# Patient Record
Sex: Female | Born: 1985 | Race: Black or African American | Hispanic: No | Marital: Single | Smoking: Current some day smoker
Health system: Southern US, Community
[De-identification: ages and names within clinical notes are randomized; demographics above are authoritative.]

## PROBLEM LIST (undated history)

## (undated) ENCOUNTER — Inpatient Hospital Stay (HOSPITAL_COMMUNITY): Payer: Self-pay

## (undated) ENCOUNTER — Ambulatory Visit: Source: Home / Self Care

## (undated) DIAGNOSIS — K589 Irritable bowel syndrome without diarrhea: Secondary | ICD-10-CM

## (undated) HISTORY — PX: INDUCED ABORTION: SHX677

## (undated) HISTORY — PX: NO PAST SURGERIES: SHX2092

---

## 2003-02-08 ENCOUNTER — Emergency Department (HOSPITAL_COMMUNITY): Admission: EM | Admit: 2003-02-08 | Discharge: 2003-02-08 | Payer: Self-pay | Admitting: Emergency Medicine

## 2003-04-10 ENCOUNTER — Inpatient Hospital Stay (HOSPITAL_COMMUNITY): Admission: AD | Admit: 2003-04-10 | Discharge: 2003-04-10 | Payer: Self-pay | Admitting: Obstetrics and Gynecology

## 2003-04-12 ENCOUNTER — Inpatient Hospital Stay (HOSPITAL_COMMUNITY): Admission: AD | Admit: 2003-04-12 | Discharge: 2003-04-14 | Payer: Self-pay | Admitting: Obstetrics and Gynecology

## 2003-05-12 ENCOUNTER — Other Ambulatory Visit: Admission: RE | Admit: 2003-05-12 | Discharge: 2003-05-12 | Payer: Self-pay | Admitting: Obstetrics & Gynecology

## 2003-12-27 ENCOUNTER — Emergency Department (HOSPITAL_COMMUNITY): Admission: EM | Admit: 2003-12-27 | Discharge: 2003-12-27 | Payer: Self-pay | Admitting: Family Medicine

## 2004-07-12 ENCOUNTER — Emergency Department (HOSPITAL_COMMUNITY): Admission: EM | Admit: 2004-07-12 | Discharge: 2004-07-12 | Payer: Self-pay | Admitting: Family Medicine

## 2004-10-31 ENCOUNTER — Emergency Department (HOSPITAL_COMMUNITY): Admission: EM | Admit: 2004-10-31 | Discharge: 2004-10-31 | Payer: Self-pay | Admitting: Family Medicine

## 2007-01-07 ENCOUNTER — Emergency Department (HOSPITAL_COMMUNITY): Admission: EM | Admit: 2007-01-07 | Discharge: 2007-01-07 | Payer: Self-pay | Admitting: Emergency Medicine

## 2007-03-10 ENCOUNTER — Other Ambulatory Visit: Admission: RE | Admit: 2007-03-10 | Discharge: 2007-03-10 | Payer: Self-pay | Admitting: Obstetrics and Gynecology

## 2007-12-08 ENCOUNTER — Emergency Department (HOSPITAL_COMMUNITY): Admission: EM | Admit: 2007-12-08 | Discharge: 2007-12-08 | Payer: Self-pay | Admitting: Family Medicine

## 2008-02-23 ENCOUNTER — Emergency Department (HOSPITAL_COMMUNITY): Admission: EM | Admit: 2008-02-23 | Discharge: 2008-02-23 | Payer: Self-pay | Admitting: Emergency Medicine

## 2008-03-22 ENCOUNTER — Inpatient Hospital Stay (HOSPITAL_COMMUNITY): Admission: AD | Admit: 2008-03-22 | Discharge: 2008-03-22 | Payer: Self-pay | Admitting: Obstetrics & Gynecology

## 2008-08-09 ENCOUNTER — Emergency Department (HOSPITAL_COMMUNITY): Admission: EM | Admit: 2008-08-09 | Discharge: 2008-08-09 | Payer: Self-pay | Admitting: Family Medicine

## 2008-09-18 ENCOUNTER — Emergency Department (HOSPITAL_COMMUNITY): Admission: EM | Admit: 2008-09-18 | Discharge: 2008-09-18 | Payer: Self-pay | Admitting: Family Medicine

## 2009-02-21 ENCOUNTER — Emergency Department (HOSPITAL_COMMUNITY): Admission: EM | Admit: 2009-02-21 | Discharge: 2009-02-21 | Payer: Self-pay | Admitting: Family Medicine

## 2009-07-08 ENCOUNTER — Emergency Department (HOSPITAL_COMMUNITY): Admission: EM | Admit: 2009-07-08 | Discharge: 2009-07-08 | Payer: Self-pay | Admitting: Emergency Medicine

## 2009-09-27 ENCOUNTER — Emergency Department (HOSPITAL_COMMUNITY): Admission: EM | Admit: 2009-09-27 | Discharge: 2009-09-27 | Payer: Self-pay | Admitting: Family Medicine

## 2009-11-27 ENCOUNTER — Emergency Department (HOSPITAL_COMMUNITY): Admission: EM | Admit: 2009-11-27 | Discharge: 2009-11-27 | Payer: Self-pay | Admitting: Family Medicine

## 2010-02-16 ENCOUNTER — Inpatient Hospital Stay (HOSPITAL_COMMUNITY): Admission: AD | Admit: 2010-02-16 | Discharge: 2010-02-16 | Payer: Self-pay | Admitting: Obstetrics

## 2010-02-20 ENCOUNTER — Ambulatory Visit (HOSPITAL_COMMUNITY): Admission: RE | Admit: 2010-02-20 | Discharge: 2010-02-20 | Payer: Self-pay | Admitting: Obstetrics & Gynecology

## 2010-07-02 ENCOUNTER — Inpatient Hospital Stay (HOSPITAL_COMMUNITY)
Admission: AD | Admit: 2010-07-02 | Discharge: 2010-07-02 | Payer: Self-pay | Source: Home / Self Care | Attending: Obstetrics & Gynecology | Admitting: Obstetrics & Gynecology

## 2010-07-10 ENCOUNTER — Ambulatory Visit (HOSPITAL_COMMUNITY)
Admission: RE | Admit: 2010-07-10 | Discharge: 2010-07-10 | Payer: Self-pay | Source: Home / Self Care | Attending: Obstetrics & Gynecology | Admitting: Obstetrics & Gynecology

## 2010-07-11 ENCOUNTER — Inpatient Hospital Stay (HOSPITAL_COMMUNITY)
Admission: AD | Admit: 2010-07-11 | Discharge: 2010-07-11 | Payer: Self-pay | Source: Home / Self Care | Attending: Obstetrics & Gynecology | Admitting: Obstetrics & Gynecology

## 2010-07-17 ENCOUNTER — Inpatient Hospital Stay (HOSPITAL_COMMUNITY)
Admission: RE | Admit: 2010-07-17 | Discharge: 2010-07-19 | Payer: Self-pay | Source: Home / Self Care | Attending: Obstetrics | Admitting: Obstetrics

## 2010-07-17 LAB — CBC
HCT: 36.8 % (ref 36.0–46.0)
Hemoglobin: 12.2 g/dL (ref 12.0–15.0)
MCH: 30.4 pg (ref 26.0–34.0)
MCHC: 33.2 g/dL (ref 30.0–36.0)
MCV: 91.8 fL (ref 78.0–100.0)
Platelets: 254 10*3/uL (ref 150–400)
RBC: 4.01 MIL/uL (ref 3.87–5.11)
RDW: 13.3 % (ref 11.5–15.5)
WBC: 9.5 10*3/uL (ref 4.0–10.5)

## 2010-07-17 LAB — RPR: RPR Ser Ql: NONREACTIVE

## 2010-07-18 LAB — CBC
HCT: 32.3 % — ABNORMAL LOW (ref 36.0–46.0)
Hemoglobin: 10.8 g/dL — ABNORMAL LOW (ref 12.0–15.0)
MCH: 30.7 pg (ref 26.0–34.0)
MCHC: 33.4 g/dL (ref 30.0–36.0)
MCV: 91.8 fL (ref 78.0–100.0)
Platelets: 207 10*3/uL (ref 150–400)
RBC: 3.52 MIL/uL — ABNORMAL LOW (ref 3.87–5.11)
RDW: 13.3 % (ref 11.5–15.5)
WBC: 15.2 10*3/uL — ABNORMAL HIGH (ref 4.0–10.5)

## 2010-09-23 LAB — COMPREHENSIVE METABOLIC PANEL
ALT: 11 U/L (ref 0–35)
AST: 19 U/L (ref 0–37)
Albumin: 2.7 g/dL — ABNORMAL LOW (ref 3.5–5.2)
Alkaline Phosphatase: 83 U/L (ref 39–117)
BUN: 6 mg/dL (ref 6–23)
CO2: 23 mEq/L (ref 19–32)
Calcium: 8.7 mg/dL (ref 8.4–10.5)
Chloride: 105 mEq/L (ref 96–112)
Creatinine, Ser: 0.47 mg/dL (ref 0.4–1.2)
GFR calc Af Amer: >60 mL/min (ref >60)
GFR calc non Af Amer: >60 mL/min (ref >60)
Glucose, Bld: 79 mg/dL (ref 70–99)
Potassium: 3.6 mEq/L (ref 3.5–5.1)
Sodium: 134 mEq/L — ABNORMAL LOW (ref 135–145)
Total Bilirubin: 0.4 mg/dL (ref 0.3–1.2)
Total Protein: 6.5 g/dL (ref 6.0–8.3)

## 2010-09-23 LAB — URINALYSIS, ROUTINE W REFLEX MICROSCOPIC
Bilirubin Urine: NEGATIVE
Bilirubin Urine: NEGATIVE
Glucose, UA: NEGATIVE mg/dL
Glucose, UA: NEGATIVE mg/dL
Hgb urine dipstick: NEGATIVE
Ketones, ur: NEGATIVE mg/dL
Ketones, ur: NEGATIVE mg/dL
Leukocytes, UA: NEGATIVE
Nitrite: NEGATIVE
Nitrite: NEGATIVE
Protein, ur: NEGATIVE mg/dL
Protein, ur: NEGATIVE mg/dL
Specific Gravity, Urine: 1.015 (ref 1.005–1.030)
Specific Gravity, Urine: 1.015 (ref 1.005–1.030)
Urobilinogen, UA: 0.2 mg/dL (ref 0.0–1.0)
Urobilinogen, UA: 0.2 mg/dL (ref 0.0–1.0)
pH: 7 (ref 5.0–8.0)
pH: 7.5 (ref 5.0–8.0)

## 2010-09-23 LAB — CBC
HCT: 34.7 % — ABNORMAL LOW (ref 36.0–46.0)
Hemoglobin: 11.6 g/dL — ABNORMAL LOW (ref 12.0–15.0)
MCH: 30.9 pg (ref 26.0–34.0)
MCHC: 33.4 g/dL (ref 30.0–36.0)
MCV: 92.3 fL (ref 78.0–100.0)
Platelets: 244 10*3/uL (ref 150–400)
RBC: 3.76 MIL/uL — ABNORMAL LOW (ref 3.87–5.11)
RDW: 13.2 % (ref 11.5–15.5)
WBC: 9.4 10*3/uL (ref 4.0–10.5)

## 2010-09-23 LAB — URINE MICROSCOPIC-ADD ON

## 2010-09-27 LAB — DIFFERENTIAL
Basophils Absolute: 0 10*3/uL (ref 0.0–0.1)
Basophils Relative: 0 % (ref 0–1)
Eosinophils Absolute: 0 10*3/uL (ref 0.0–0.7)
Eosinophils Relative: 0 % (ref 0–5)
Lymphocytes Relative: 19 % (ref 12–46)
Lymphs Abs: 2.5 10*3/uL (ref 0.7–4.0)
Monocytes Absolute: 1 10*3/uL (ref 0.1–1.0)
Monocytes Relative: 8 % (ref 3–12)
Neutro Abs: 9.5 10*3/uL — ABNORMAL HIGH (ref 1.7–7.7)
Neutrophils Relative %: 73 % (ref 43–77)

## 2010-09-27 LAB — CBC
HCT: 35.5 % — ABNORMAL LOW (ref 36.0–46.0)
Hemoglobin: 12.2 g/dL (ref 12.0–15.0)
MCH: 32.7 pg (ref 26.0–34.0)
MCHC: 34.3 g/dL (ref 30.0–36.0)
MCV: 95.2 fL (ref 78.0–100.0)
Platelets: 252 10*3/uL (ref 150–400)
RBC: 3.73 MIL/uL — ABNORMAL LOW (ref 3.87–5.11)
RDW: 13.1 % (ref 11.5–15.5)
WBC: 13.1 10*3/uL — ABNORMAL HIGH (ref 4.0–10.5)

## 2010-09-27 LAB — URINE MICROSCOPIC-ADD ON

## 2010-09-27 LAB — URINALYSIS, ROUTINE W REFLEX MICROSCOPIC
Bilirubin Urine: NEGATIVE
Glucose, UA: NEGATIVE mg/dL
Hgb urine dipstick: NEGATIVE
Ketones, ur: NEGATIVE mg/dL
Nitrite: NEGATIVE
Protein, ur: NEGATIVE mg/dL
Specific Gravity, Urine: >1.03 — ABNORMAL HIGH (ref 1.005–1.030)
Urobilinogen, UA: 0.2 mg/dL (ref 0.0–1.0)
pH: 5.5 (ref 5.0–8.0)

## 2010-09-27 LAB — URINE CULTURE
Colony Count: 25000
Culture  Setup Time: 201108062016

## 2010-09-30 LAB — WET PREP, GENITAL
Trich, Wet Prep: NONE SEEN
Yeast Wet Prep HPF POC: NONE SEEN

## 2010-09-30 LAB — POCT URINALYSIS DIP (DEVICE)
Bilirubin Urine: NEGATIVE
Glucose, UA: NEGATIVE mg/dL
Hgb urine dipstick: NEGATIVE
Ketones, ur: NEGATIVE mg/dL
Nitrite: NEGATIVE
Protein, ur: NEGATIVE mg/dL
Specific Gravity, Urine: 1.025 (ref 1.005–1.030)
Urobilinogen, UA: 1 mg/dL (ref 0.0–1.0)
pH: 7.5 (ref 5.0–8.0)

## 2010-09-30 LAB — GC/CHLAMYDIA PROBE AMP, GENITAL
Chlamydia, DNA Probe: NEGATIVE
GC Probe Amp, Genital: NEGATIVE

## 2010-09-30 LAB — POCT PREGNANCY, URINE: Preg Test, Ur: POSITIVE

## 2010-10-06 LAB — POCT URINALYSIS DIP (DEVICE)
Bilirubin Urine: NEGATIVE
Glucose, UA: NEGATIVE mg/dL
Ketones, ur: NEGATIVE mg/dL
Nitrite: NEGATIVE
Protein, ur: NEGATIVE mg/dL
Specific Gravity, Urine: 1.02 (ref 1.005–1.030)
Urobilinogen, UA: 1 mg/dL (ref 0.0–1.0)
pH: 6.5 (ref 5.0–8.0)

## 2010-10-06 LAB — WET PREP, GENITAL
Clue Cells Wet Prep HPF POC: NONE SEEN
Trich, Wet Prep: NONE SEEN
Yeast Wet Prep HPF POC: NONE SEEN

## 2010-10-06 LAB — POCT PREGNANCY, URINE: Preg Test, Ur: NEGATIVE

## 2010-10-06 LAB — GC/CHLAMYDIA PROBE AMP, GENITAL
Chlamydia, DNA Probe: NEGATIVE
GC Probe Amp, Genital: NEGATIVE

## 2010-10-06 LAB — HERPES SIMPLEX VIRUS CULTURE: Culture: NOT DETECTED

## 2010-10-24 LAB — POCT URINALYSIS DIP (DEVICE)
Bilirubin Urine: NEGATIVE
Glucose, UA: NEGATIVE mg/dL
Hgb urine dipstick: NEGATIVE
Ketones, ur: NEGATIVE mg/dL
Specific Gravity, Urine: 1.02 (ref 1.005–1.030)

## 2010-10-24 LAB — POCT PREGNANCY, URINE: Preg Test, Ur: NEGATIVE

## 2010-10-24 LAB — GC/CHLAMYDIA PROBE AMP, GENITAL
Chlamydia, DNA Probe: NEGATIVE
GC Probe Amp, Genital: NEGATIVE

## 2010-10-24 LAB — WET PREP, GENITAL: Trich, Wet Prep: NONE SEEN

## 2010-10-24 LAB — RPR: RPR Ser Ql: NONREACTIVE

## 2011-04-09 LAB — POCT URINALYSIS DIP (DEVICE)
Protein, ur: NEGATIVE
Specific Gravity, Urine: 1.025
pH: 5

## 2011-04-09 LAB — POCT PREGNANCY, URINE: Operator id: 239701

## 2011-04-09 LAB — WET PREP, GENITAL
Trich, Wet Prep: NONE SEEN
Yeast Wet Prep HPF POC: NONE SEEN

## 2011-04-09 LAB — GC/CHLAMYDIA PROBE AMP, GENITAL: GC Probe Amp, Genital: NEGATIVE

## 2011-04-11 LAB — POCT URINALYSIS DIP (DEVICE)
Ketones, ur: NEGATIVE
Operator id: 239701
Protein, ur: NEGATIVE
Specific Gravity, Urine: 1.015
Urobilinogen, UA: 1
pH: 7

## 2011-04-11 LAB — WET PREP, GENITAL: Trich, Wet Prep: NONE SEEN

## 2011-04-11 LAB — POCT PREGNANCY, URINE: Preg Test, Ur: NEGATIVE

## 2011-04-11 LAB — GC/CHLAMYDIA PROBE AMP, GENITAL: GC Probe Amp, Genital: NEGATIVE

## 2011-04-16 LAB — URINALYSIS, ROUTINE W REFLEX MICROSCOPIC
Leukocytes, UA: NEGATIVE
Nitrite: NEGATIVE
Protein, ur: NEGATIVE
Specific Gravity, Urine: >1.03 — ABNORMAL HIGH
Urobilinogen, UA: 0.2

## 2011-04-16 LAB — WET PREP, GENITAL
Clue Cells Wet Prep HPF POC: NONE SEEN
Trich, Wet Prep: NONE SEEN
Yeast Wet Prep HPF POC: NONE SEEN

## 2011-04-16 LAB — GC/CHLAMYDIA PROBE AMP, GENITAL: Chlamydia, DNA Probe: NEGATIVE

## 2011-04-16 LAB — URINE MICROSCOPIC-ADD ON

## 2011-11-03 ENCOUNTER — Emergency Department (HOSPITAL_BASED_OUTPATIENT_CLINIC_OR_DEPARTMENT_OTHER)
Admission: EM | Admit: 2011-11-03 | Discharge: 2011-11-03 | Disposition: A | Discharge status: Home / Self Care | Payer: Self-pay | Attending: Emergency Medicine | Admitting: Emergency Medicine

## 2011-11-03 ENCOUNTER — Encounter (HOSPITAL_BASED_OUTPATIENT_CLINIC_OR_DEPARTMENT_OTHER): Payer: Self-pay

## 2011-11-03 DIAGNOSIS — F17200 Nicotine dependence, unspecified, uncomplicated: Secondary | ICD-10-CM | POA: Insufficient documentation

## 2011-11-03 DIAGNOSIS — J310 Chronic rhinitis: Secondary | ICD-10-CM

## 2011-11-03 DIAGNOSIS — J309 Allergic rhinitis, unspecified: Secondary | ICD-10-CM | POA: Insufficient documentation

## 2011-11-03 DIAGNOSIS — N898 Other specified noninflammatory disorders of vagina: Secondary | ICD-10-CM | POA: Insufficient documentation

## 2011-11-03 LAB — URINE MICROSCOPIC-ADD ON

## 2011-11-03 LAB — URINALYSIS, ROUTINE W REFLEX MICROSCOPIC
Bilirubin Urine: NEGATIVE
Ketones, ur: NEGATIVE mg/dL
Nitrite: NEGATIVE
Protein, ur: NEGATIVE mg/dL
pH: 6 (ref 5.0–8.0)

## 2011-11-03 MED ORDER — PREDNISONE 50 MG PO TABS
60.0000 mg | ORAL_TABLET | Freq: Once | ORAL | Status: AC
  Administered 2011-11-03: 60 mg via ORAL
  Filled 2011-11-03: qty 1

## 2011-11-03 MED ORDER — PREDNISONE 50 MG PO TABS: 50.0000 mg | ORAL_TABLET | Freq: Every day | ORAL | Status: AC

## 2011-11-03 MED ORDER — FEXOFENADINE-PSEUDOEPHED ER 60-120 MG PO TB12: 1.0000 | ORAL_TABLET | Freq: Two times a day (BID) | ORAL | Status: AC

## 2011-11-03 NOTE — ED Notes (Signed)
Pt states that she has vaginal discharge which she would like to be looked at, also c/o sinus infection.  Pt states that she is taking otc meds for sinus infection and it is not improving.

## 2011-11-03 NOTE — Discharge Instructions (Signed)
Return here as needed. Follow up for care for your vaginal issue.

## 2011-11-06 NOTE — ED Provider Notes (Signed)
History     CSN: [id number]  Arrival date & time 11/03/11  1302   First MD Initiated Contact with Patient 11/03/11 1314      Chief Complaint  Patient presents with  . Recurrent Sinusitis  . Vaginal Discharge    (Consider location/radiation/quality/duration/timing/severity/associated sxs/prior treatment) HPI The patient presents to the ER with a complaint of watery eyes and nasal discharge for th last week. She also noted that she has a vaginal discharge which she has had in the past with bacterial vaginosis. The patient denies CP, SOB, weakness, vaginal bleeding, dizziness, visual changes, cough, ST, N/V, diarrhea, abd pain, or fever. The patient states that she noted when she was outside her symptoms worsen. The patient states that this has not gotten better with OTC medications. She states that she has not had an STD in the past. History reviewed. No pertinent past medical history.  History reviewed. No pertinent past surgical history.  History reviewed. No pertinent family history.  History  Substance Use Topics  . Smoking status: Current Some Day Smoker    Types: Cigarettes  . Smokeless tobacco: Never Used  . Alcohol Use: Yes     socially    OB History    Grav Para Term Preterm Abortions TAB SAB Ect Mult Living                  Review of Systems All other systems negative except as documented in the HPI. All pertinent positives and negatives as reviewed in the HPI.   Allergies  Review of patient's allergies indicates no known allergies.  Home Medications   Current Outpatient Rx  Name Route Sig Dispense Refill  . FEXOFENADINE-PSEUDOEPHED ER 60-120 MG PO TB12 Oral Take 1 tablet by mouth every 12 (twelve) hours. 30 tablet 0  . PREDNISONE 50 MG PO TABS Oral Take 1 tablet (50 mg total) by mouth daily. 5 tablet 0    BP 129/74  Pulse 94  Temp(Src) 97.9 F (36.6 C) (Oral)  Resp 17  Ht 5\' 7"  (1.702 m)  Wt 163 lb (73.936 kg)  BMI 25.53 kg/m2  SpO2  99%  Physical Exam Physical Examination: General appearance - alert, well appearing, and in no distress, oriented to person, place, and time and normal appearing weight Mental status - alert, oriented to person, place, and time, normal mood, behavior, speech, dress, motor activity, and thought processes Eyes - pupils equal and reactive, extraocular eye movements intact. The patient has watering and mild injection of both eyes. Ears - bilateral TM's and external ear canals normal Nose - mucosal congestion and clear rhinorrhea Mouth - mucous membranes moist, pharynx normal without lesions Neck - supple, no significant adenopathy Lymphatics - no palpable lymphadenopathy Chest - clear to auscultation, no wheezes, rales or rhonchi, symmetric air entry, no tachypnea, retractions or cyanosis Heart - normal rate, regular rhythm, normal S1, S2, no murmurs, rubs, clicks or gallops Abdomen - soft, nontender, nondistended, no masses or organomegaly  ED Course  Procedures (including critical care time)  Labs Reviewed  URINALYSIS, ROUTINE W REFLEX MICROSCOPIC - Abnormal; Notable for the following:    APPearance CLOUDY (*)    Specific Gravity, Urine 1.031 (*)    Hgb urine dipstick SMALL (*)    Leukocytes, UA SMALL (*)    All other components within normal limits  URINE MICROSCOPIC-ADD ON - Abnormal; Notable for the following:    Squamous Epithelial / LPF FEW (*)    Bacteria, UA MANY (*)  All other components within normal limits  PREGNANCY, URINE   No results found.   1. Rhinitis    The patient has decided to leave and does not want to have a pelvic exam at this time.    MDM  The patient describes an allergic rhinitis picture based on her HPI and PE. The patient is advised to be seen about her pelvic issues when she has more time to be fully evaluated.        Carlyle Dolly, PA-C 11/06/11 1149

## 2011-11-07 NOTE — ED Provider Notes (Signed)
Medical screening examination/treatment/procedure(s) were performed by non-physician practitioner and as supervising physician I was immediately available for consultation/collaboration.   Joya Gaskins, MD 11/07/11 2235

## 2012-02-19 ENCOUNTER — Emergency Department (INDEPENDENT_AMBULATORY_CARE_PROVIDER_SITE_OTHER)
Admission: EM | Admit: 2012-02-19 | Discharge: 2012-02-19 | Disposition: A | Discharge status: Home / Self Care | Payer: Self-pay | Source: Home / Self Care | Attending: Family Medicine | Admitting: Family Medicine

## 2012-02-19 ENCOUNTER — Encounter (HOSPITAL_COMMUNITY): Payer: Self-pay | Admitting: *Deleted

## 2012-02-19 DIAGNOSIS — N760 Acute vaginitis: Secondary | ICD-10-CM

## 2012-02-19 DIAGNOSIS — A499 Bacterial infection, unspecified: Secondary | ICD-10-CM

## 2012-02-19 DIAGNOSIS — B9689 Other specified bacterial agents as the cause of diseases classified elsewhere: Secondary | ICD-10-CM

## 2012-02-19 LAB — WET PREP, GENITAL: Trich, Wet Prep: NONE SEEN

## 2012-02-19 LAB — POCT URINALYSIS DIP (DEVICE)
Protein, ur: NEGATIVE mg/dL
Specific Gravity, Urine: 1.02 (ref 1.005–1.030)
Urobilinogen, UA: 1 mg/dL (ref 0.0–1.0)

## 2012-02-19 MED ORDER — METRONIDAZOLE 250 MG PO TABS: 250.0000 mg | ORAL_TABLET | Freq: Three times a day (TID) | ORAL | Status: AC

## 2012-02-19 MED ORDER — FLUCONAZOLE 150 MG PO TABS: 150.0000 mg | ORAL_TABLET | Freq: Once | ORAL | Status: AC

## 2012-02-19 NOTE — ED Notes (Signed)
Pt  Reports   Symptoms      Of    Vaginal  Discharge        X  3  Days        -        Walks  Upright  With a  Steady  Fluid  Gait      -       Pt       denys  Any  Pain

## 2012-02-19 NOTE — ED Provider Notes (Signed)
History     CSN: [id number]  Arrival date & time 02/19/12  1512   First MD Initiated Contact with Patient 02/19/12 1615      Chief Complaint  Patient presents with  . Vaginal Discharge    (Consider location/radiation/quality/duration/timing/severity/associated sxs/prior treatment) Patient is a 26 y.o. female presenting with vaginal discharge. The history is provided by the patient.  Vaginal Discharge This is a new problem. The current episode started more than 2 days ago. The problem has been gradually worsening. Pertinent negatives include no abdominal pain. Associated symptoms comments: H/o BV, last treated 1 yr ago.Marland Kitchen    History reviewed. No pertinent past medical history.  History reviewed. No pertinent past surgical history.  No family history on file.  History  Substance Use Topics  . Smoking status: Current Some Day Smoker    Types: Cigarettes  . Smokeless tobacco: Never Used  . Alcohol Use: Yes     socially    OB History    Grav Para Term Preterm Abortions TAB SAB Ect Mult Living                  Review of Systems  Constitutional: Negative.   Gastrointestinal: Negative.  Negative for abdominal pain.  Genitourinary: Positive for vaginal discharge. Negative for dysuria, vaginal bleeding, difficulty urinating and vaginal pain.    Allergies  Review of patient's allergies indicates no known allergies.  Home Medications   Current Outpatient Rx  Name Route Sig Dispense Refill  . FEXOFENADINE-PSEUDOEPHED ER 60-120 MG PO TB12 Oral Take 1 tablet by mouth every 12 (twelve) hours. 30 tablet 0  . METRONIDAZOLE 250 MG PO TABS Oral Take 1 tablet (250 mg total) by mouth 3 (three) times daily. 21 tablet 0    BP 118/65  Pulse 90  Temp 98.3 F (36.8 C) (Oral)  Resp 16  SpO2 100%  Physical Exam  Nursing note and vitals reviewed. Constitutional: She is oriented to person, place, and time. She appears well-developed and well-nourished.  Abdominal: Soft. Bowel  sounds are normal. She exhibits no distension and no mass. There is no tenderness. There is no rebound and no guarding.  Genitourinary: Uterus normal. Vaginal discharge found.  Neurological: She is alert and oriented to person, place, and time.  Skin: Skin is warm and dry.    ED Course  Procedures (including critical care time)  Labs Reviewed  POCT URINALYSIS DIP (DEVICE) - Abnormal; Notable for the following:    Ketones, ur TRACE (*)     Hgb urine dipstick TRACE (*)     All other components within normal limits  WET PREP, GENITAL - Abnormal; Notable for the following:    Clue Cells Wet Prep HPF POC FEW (*)     WBC, Wet Prep HPF POC MODERATE (*)     All other components within normal limits  GC/CHLAMYDIA PROBE AMP, GENITAL  POCT PREGNANCY, URINE  LAB REPORT - SCANNED   No results found.   1. Bacterial vaginosis       MDM          Linna Hoff, MD 02/23/12 2012

## 2012-02-20 NOTE — ED Notes (Signed)
GC/Chlamydia neg., Wet prep: few clue cells, few WBC's.  Pt. adequately treated with Flagyl. [name] 02/20/2012

## 2012-02-23 ENCOUNTER — Telehealth (HOSPITAL_COMMUNITY): Payer: Self-pay | Admitting: *Deleted

## 2012-02-23 NOTE — ED Notes (Signed)
Pt. called on VM x 2 for her lab results. Pt. called again.  Pt. verified x 2 and given results.  Pt. told she was adequately treated with Flagyl. [name] 02/23/2012

## 2012-04-07 ENCOUNTER — Ambulatory Visit: Payer: Self-pay

## 2012-04-20 ENCOUNTER — Other Ambulatory Visit: Payer: Self-pay | Admitting: Internal Medicine

## 2012-09-02 ENCOUNTER — Emergency Department (INDEPENDENT_AMBULATORY_CARE_PROVIDER_SITE_OTHER)
Admission: EM | Admit: 2012-09-02 | Discharge: 2012-09-02 | Payer: Self-pay | Source: Home / Self Care | Attending: Family Medicine | Admitting: Family Medicine

## 2012-09-03 ENCOUNTER — Encounter (HOSPITAL_COMMUNITY): Payer: Self-pay

## 2012-09-03 ENCOUNTER — Other Ambulatory Visit (HOSPITAL_COMMUNITY)
Admission: RE | Admit: 2012-09-03 | Discharge: 2012-09-03 | Disposition: A | Discharge status: Home / Self Care | Payer: Self-pay | Source: Ambulatory Visit | Attending: Emergency Medicine | Admitting: Emergency Medicine

## 2012-09-03 ENCOUNTER — Emergency Department (INDEPENDENT_AMBULATORY_CARE_PROVIDER_SITE_OTHER)
Admission: EM | Admit: 2012-09-03 | Discharge: 2012-09-03 | Disposition: A | Discharge status: Home / Self Care | Payer: Self-pay | Source: Home / Self Care | Attending: Emergency Medicine | Admitting: Emergency Medicine

## 2012-09-03 DIAGNOSIS — Z113 Encounter for screening for infections with a predominantly sexual mode of transmission: Secondary | ICD-10-CM | POA: Insufficient documentation

## 2012-09-03 DIAGNOSIS — L0291 Cutaneous abscess, unspecified: Secondary | ICD-10-CM

## 2012-09-03 DIAGNOSIS — L0390 Cellulitis, unspecified: Secondary | ICD-10-CM

## 2012-09-03 DIAGNOSIS — N760 Acute vaginitis: Secondary | ICD-10-CM | POA: Insufficient documentation

## 2012-09-03 LAB — POCT URINALYSIS DIP (DEVICE)
Bilirubin Urine: NEGATIVE
Glucose, UA: NEGATIVE mg/dL
Leukocytes, UA: NEGATIVE
Nitrite: NEGATIVE
Urobilinogen, UA: 1 mg/dL (ref 0.0–1.0)
pH: 6.5 (ref 5.0–8.0)

## 2012-09-03 MED ORDER — CEPHALEXIN 500 MG PO CAPS: 500.0000 mg | ORAL_CAPSULE | Freq: Three times a day (TID) | ORAL | Status: DC

## 2012-09-03 MED ORDER — FLUCONAZOLE 150 MG PO TABS: 150.0000 mg | ORAL_TABLET | Freq: Once | ORAL | Status: DC

## 2012-09-03 NOTE — ED Provider Notes (Signed)
Chief Complaint  Patient presents with  . Vaginitis    History of Present Illness:   Amy Davies is a 27 year old female who has had a one-week history of a sore, raw, itchy papule on her left labia majora anteriorly. This has not Mr. ulcerated. She's also had a slight vaginal discharge but denies any itching or odor. She has a history of bacterial vaginosis in the past. She denies any history of herpes or other STDs. She has not had a menstrual period in over 2 years. She had been on the Depo-Provera but got her last injection September 18. She is sexually active since then, but does not use any birth control. She denies any symptoms of pregnancy. She has no allergies, takes no meds and has no other medical illnesses.  Review of Systems:  Other than noted above, the patient denies any of the following symptoms: Systemic:  No fever, chills, sweats, fatigue, or weight loss. GI:  No abdominal pain, nausea, anorexia, vomiting, diarrhea, constipation, melena or hematochezia. GU:  No dysuria, frequency, urgency, hematuria, vaginal discharge, itching, or abnormal vaginal bleeding. Skin:  No rash or itching.  PMFSH:  Past medical history, family history, social history, meds, and allergies were reviewed.  Physical Exam:   Vital signs:  BP 127/80  Pulse 82  Temp(Src) 98.3 F (36.8 C) (Oral)  Resp 20  SpO2 98% General:  Alert, oriented and in no distress. Lungs:  Breath sounds clear and equal bilaterally.  No wheezes, rales or rhonchi. Heart:  Regular rhythm.  No gallops or murmers. Abdomen:  Soft, flat and non-distended.  No organomegaly or mass.  No tenderness, guarding or rebound.  Bowel sounds normally active. Pelvic exam:  There is an 8mm, round, raised, red papule on the left labia majora anteriorly. This is minimally tender to palpation. There is no drainage or fluctuance. External genitalia are otherwise normal. Vaginal and cervical mucosa were normal. There is a small amount of white  discharge which was non-malodorous. No pain on cervical motion. Uterus was anterior and normal in size and shape. No adnexal masses or tenderness. Skin:  Clear, warm and dry.    Labs:   Results for orders placed during the hospital encounter of 09/03/12  POCT URINALYSIS DIP (DEVICE)      Result Value Range   Glucose, UA NEGATIVE  NEGATIVE mg/dL   Bilirubin Urine NEGATIVE  NEGATIVE   Ketones, ur NEGATIVE  NEGATIVE mg/dL   Specific Gravity, Urine 1.025  1.005 - 1.030   Hgb urine dipstick TRACE (*) NEGATIVE   pH 6.5  5.0 - 8.0   Protein, ur NEGATIVE  NEGATIVE mg/dL   Urobilinogen, UA 1.0  0.0 - 1.0 mg/dL   Nitrite NEGATIVE  NEGATIVE   Leukocytes, UA NEGATIVE  NEGATIVE  POCT PREGNANCY, URINE      Result Value Range   Preg Test, Ur NEGATIVE  NEGATIVE    Other Labs Obtained at Urgent Care Center:  DNA probes for gonorrhea, Chlamydia, Trichomonas, Gardnerella, Candida obtained.  Results are pending at this time and we will call about any positive results.  Assessment:  The encounter diagnosis was Abscess.  Plan:   1.  The following meds were prescribed:   Discharge Medication List as of 09/03/2012  2:42 PM    START taking these medications   Details  cephALEXin (KEFLEX) 500 MG capsule Take 1 capsule (500 mg total) by mouth 3 (three) times daily., Starting 09/03/2012, Until Discontinued, Normal    fluconazole (DIFLUCAN) 150  MG tablet Take 1 tablet (150 mg total) by mouth once., Starting 09/03/2012, Normal       2.  The patient was instructed in symptomatic care and handouts were given. 3.  The patient was told to return if becoming worse in any way, if no better in 3 or 4 days, and given some red flag symptoms such as fever, worsening of pain, or purulent drainage that would indicate earlier return.    Reuben Likes, MD 09/03/12 [phone]

## 2012-09-03 NOTE — ED Notes (Signed)
Concern for poss yeast , used OTC yeast treatment, made issue worse; states she has a bump in her perineal area, shaves her pubic hair

## 2012-09-03 NOTE — ED Notes (Signed)
Call cell number for lab issues

## 2012-09-07 ENCOUNTER — Telehealth (HOSPITAL_COMMUNITY): Payer: Self-pay | Admitting: Emergency Medicine

## 2012-09-07 MED ORDER — METRONIDAZOLE 500 MG PO TABS: 500.0000 mg | ORAL_TABLET | Freq: Two times a day (BID) | ORAL | Status: DC

## 2012-09-07 NOTE — Telephone Encounter (Signed)
Message copied by Reuben Likes on Tue Sep 07, 2012  9:13 PM ------      Message from: [name]      Created: Tue Sep 07, 2012  6:11 PM      Regarding: labs       Gardnerella pos. No order for Flagyl noted.      [name]      09/07/2012            ----- Message -----         From: [facility] Interface         Sent: 09/06/2012   3:18 PM           To: [facility] Follow Up                   ------

## 2012-09-07 NOTE — ED Notes (Signed)
DNA probe was positive for Gardnerella. We'll need to treat with Flagyl 500 mg twice a day for one week.  Reuben Likes, MD 09/07/12 2114

## 2012-09-09 ENCOUNTER — Telehealth (HOSPITAL_COMMUNITY): Payer: Self-pay | Admitting: *Deleted

## 2012-09-09 NOTE — ED Notes (Signed)
2/25  GC/Chlamydia neg., Affirm: Gardnerella pos., Candida and Trich neg.  Flagyl e-prescibed to [facility] on [address]. 2/27  I called pt. Pt. verified x 2 and given results.  Pt. told she needs Flagyl for bacterial vaginosis.   Pt. instructed to no alcohol while taking this medication.  Pt. told it was sent to her pharmacy and voiced understanding. Amy Davies 09/09/2012

## 2013-03-18 ENCOUNTER — Encounter (HOSPITAL_COMMUNITY): Payer: Self-pay | Admitting: Emergency Medicine

## 2013-03-18 ENCOUNTER — Telehealth (HOSPITAL_COMMUNITY): Payer: Self-pay | Admitting: Family Medicine

## 2013-03-18 ENCOUNTER — Emergency Department (INDEPENDENT_AMBULATORY_CARE_PROVIDER_SITE_OTHER)
Admission: EM | Admit: 2013-03-18 | Discharge: 2013-03-18 | Disposition: A | Discharge status: Home / Self Care | Payer: Self-pay | Source: Home / Self Care | Attending: Family Medicine | Admitting: Family Medicine

## 2013-03-18 DIAGNOSIS — N760 Acute vaginitis: Secondary | ICD-10-CM

## 2013-03-18 LAB — POCT URINALYSIS DIP (DEVICE)
Bilirubin Urine: NEGATIVE
Hgb urine dipstick: NEGATIVE
Leukocytes, UA: NEGATIVE
Nitrite: NEGATIVE
Urobilinogen, UA: 0.2 mg/dL (ref 0.0–1.0)
pH: 7 (ref 5.0–8.0)

## 2013-03-18 MED ORDER — FLUCONAZOLE 150 MG PO TABS: 150.0000 mg | ORAL_TABLET | Freq: Once | ORAL | Status: DC

## 2013-03-18 MED ORDER — METRONIDAZOLE 500 MG PO TABS: 500.0000 mg | ORAL_TABLET | Freq: Two times a day (BID) | ORAL | Status: DC

## 2013-03-18 NOTE — ED Notes (Signed)
Lab called stating the affirm and GC/CH specimens were rejected.  Theresa Mulligan RN made aware.

## 2013-03-18 NOTE — ED Notes (Signed)
Vaginal discharge, frequent bacterial infections.  Vaginal discharge for 4 days

## 2013-03-18 NOTE — ED Notes (Signed)
Amy Davies said she got a written order for Flagyl  500 mg. 1 BID #14 from Dr. Artis Flock.  She asked me to call pt.   I called pt. And told her she needed it. I asked her what pharmacy did she want me to call it.  She said she already got a written Rx. for Flagyl and Diflucan.  I told her I did not have to call it. She can take her Rx.'s to the pharmacy. [name] 03/18/2013

## 2013-03-18 NOTE — ED Provider Notes (Signed)
CSN: [id number]     Arrival date & time 03/18/13  7253 History   First MD Initiated Contact with Patient 03/18/13 1018     Chief Complaint  Patient presents with  . Vaginal Discharge   (Consider location/radiation/quality/duration/timing/severity/associated sxs/prior Treatment) Patient is a 27 y.o. female presenting with vaginal discharge. The history is provided by the patient.  Vaginal Discharge Quality:  Thin and mucoid Severity:  Moderate Onset quality:  Gradual Duration:  4 days Progression:  Waxing and waning Chronicity:  Chronic Context comment:  H/o BV freq, Associated symptoms: no abdominal pain, no dysuria and no urinary frequency   Risk factors: no new sexual partner and no STI exposure     History reviewed. No pertinent past medical history. History reviewed. No pertinent past surgical history. No family history on file. History  Substance Use Topics  . Smoking status: Current Some Day Smoker    Types: Cigarettes  . Smokeless tobacco: Never Used  . Alcohol Use: Yes     Comment: socially   OB History   Grav Para Term Preterm Abortions TAB SAB Ect Mult Living                 Review of Systems  Constitutional: Negative.   Gastrointestinal: Negative.  Negative for abdominal pain.  Genitourinary: Positive for vaginal discharge. Negative for dysuria, urgency, vaginal bleeding and menstrual problem.    Allergies  Review of patient's allergies indicates no known allergies.  Home Medications   Current Outpatient Rx  Name  Route  Sig  Dispense  Refill  . cephALEXin (KEFLEX) 500 MG capsule   Oral   Take 1 capsule (500 mg total) by mouth 3 (three) times daily.   30 capsule   0   . fluconazole (DIFLUCAN) 150 MG tablet   Oral   Take 1 tablet (150 mg total) by mouth once.   3 tablet   0   . fluconazole (DIFLUCAN) 150 MG tablet   Oral   Take 1 tablet (150 mg total) by mouth once. Repeat in 1 week if needed   1 tablet   1   . metroNIDAZOLE (FLAGYL) 500 MG  tablet   Oral   Take 1 tablet (500 mg total) by mouth 2 (two) times daily.   14 tablet   0   . metroNIDAZOLE (FLAGYL) 500 MG tablet   Oral   Take 1 tablet (500 mg total) by mouth 2 (two) times daily.   14 tablet   0    BP 114/77  Pulse 87  Temp(Src) 98.8 F (37.1 C) (Oral)  Resp 20  SpO2 100% Physical Exam  Nursing note and vitals reviewed. Constitutional: She is oriented to person, place, and time. She appears well-developed and well-nourished. No distress.  Genitourinary: Uterus normal. Cervix exhibits no motion tenderness, no discharge and no friability. Right adnexum displays no mass, no tenderness and no fullness. Left adnexum displays no mass, no tenderness and no fullness. No erythema or tenderness around the vagina. No signs of injury around the vagina. Vaginal discharge found.  Neurological: She is alert and oriented to person, place, and time.  Skin: Skin is warm and dry.    ED Course  Procedures (including critical care time) Labs Review Labs Reviewed  POCT URINALYSIS DIP (DEVICE)  POCT PREGNANCY, URINE  CERVICOVAGINAL ANCILLARY ONLY   Imaging Review No results found.  MDM   1. Vaginitis    U/a and upreg neg.    Linna Hoff, MD 03/18/13 [phone]

## 2013-06-15 ENCOUNTER — Emergency Department (INDEPENDENT_AMBULATORY_CARE_PROVIDER_SITE_OTHER)
Admission: EM | Admit: 2013-06-15 | Discharge: 2013-06-15 | Disposition: A | Discharge status: Home / Self Care | Payer: Self-pay | Source: Home / Self Care | Attending: Family Medicine | Admitting: Family Medicine

## 2013-06-15 ENCOUNTER — Encounter (HOSPITAL_COMMUNITY): Payer: Self-pay | Admitting: Emergency Medicine

## 2013-06-15 DIAGNOSIS — K589 Irritable bowel syndrome without diarrhea: Secondary | ICD-10-CM

## 2013-06-15 HISTORY — DX: Irritable bowel syndrome, unspecified: K58.9

## 2013-06-15 MED ORDER — PROMETHAZINE HCL 25 MG PO TABS: 25.0000 mg | ORAL_TABLET | Freq: Four times a day (QID) | ORAL | Status: DC | PRN

## 2013-06-15 MED ORDER — POLYETHYLENE GLYCOL 3350 17 GM/SCOOP PO POWD: 1.0000 | Freq: Once | ORAL | Status: DC

## 2013-06-15 NOTE — ED Provider Notes (Signed)
CSN: [id number]     Arrival date & time 06/15/13  1348 History   First MD Initiated Contact with Patient 06/15/13 1518     Chief Complaint  Patient presents with  . Diarrhea  . Emesis   HPI 27 yo female with h/o IBS who presents with abdominal cramping and constipation alternating with diarrhea. She reports constipation with no bowel movement for 5 days up until today when she had a loose watery bowel movement, non bloody. This was associated with cramping located in the periumbilical area. She had associated nausea and non bloody, non bilious emesis. She had sensation of flushing and a bit of dizziness with the cramping and diarrhea. She reports improvement of cramping and pain after bowel movement. She has had poor appetite for the last 2 days but has been able to keep fluids down. She denies any dysuria or increased urinary frequency.  She states that she was diagnosed with IBS by a GI doctor in 2003 in [place]. She has not had a colonoscopy. She has not followed up with GI since then.   Past Medical History  Diagnosis Date  . Irritable bowel syndrome    History reviewed. No pertinent past surgical history. History reviewed. No pertinent family history. History  Substance Use Topics  . Smoking status: Current Some Day Smoker    Types: Cigarettes  . Smokeless tobacco: Never Used  . Alcohol Use: Yes     Comment: socially   OB History   Grav Para Term Preterm Abortions TAB SAB Ect Mult Living                 Review of Systems Negative except per HPI Allergies  Review of patient's allergies indicates no known allergies.  Home Medications   Current Outpatient Rx  Name  Route  Sig  Dispense  Refill  . medroxyPROGESTERone (DEPO-PROVERA) 150 MG/ML injection   Intramuscular   Inject 150 mg into the muscle every 3 (three) months.         . cephALEXin (KEFLEX) 500 MG capsule   Oral   Take 1 capsule (500 mg total) by mouth 3 (three) times daily.   30 capsule   0   .  fluconazole (DIFLUCAN) 150 MG tablet   Oral   Take 1 tablet (150 mg total) by mouth once.   3 tablet   0   . fluconazole (DIFLUCAN) 150 MG tablet   Oral   Take 1 tablet (150 mg total) by mouth once. Repeat in 1 week if needed   1 tablet   1   . metroNIDAZOLE (FLAGYL) 500 MG tablet   Oral   Take 1 tablet (500 mg total) by mouth 2 (two) times daily.   14 tablet   0   . metroNIDAZOLE (FLAGYL) 500 MG tablet   Oral   Take 1 tablet (500 mg total) by mouth 2 (two) times daily.   14 tablet   0   . polyethylene glycol powder (GLYCOLAX/MIRALAX) powder   Oral   Take 255 g (1 Container total) by mouth once.   527 g   2   . promethazine (PHENERGAN) 25 MG tablet   Oral   Take 1 tablet (25 mg total) by mouth every 6 (six) hours as needed for nausea or vomiting.   30 tablet   0    BP 102/69  Pulse 60  Temp(Src) 98.9 F (37.2 C) (Oral)  Resp 20  SpO2 100% Physical Exam General: no acute distress, alert  and oriented x4 CV: S1S2, RRR, no murmur appreciated Pulm: CTA b/l Abdomen: soft, mild discomfort in periumbilical area, no masses, no rebound  ED Course  Procedures (including critical care time) Pregnancy test: negative  MDM   1. Irritable bowel syndrome    Abdominal spasm likely from IBS flare. With her history of constipation and her recent diarrhea today, it is possible that she is having loose stools around stool ball. Will treat with miralax to regulate bowel movements. Treat nausea with phenergan. Patient was tolerating fluids and refused zofran in the urgent care office, preferring to fill the phenergan for home.  Stressed the importance of follow with primary care doctor and gave her information about [facility]. May need referral to GI in the future.   Marena Chancy, PGY-3 Family Medicine Resident      Lonia Skinner, MD 06/15/13 [phone]

## 2013-06-15 NOTE — ED Notes (Signed)
States she has had vomiting and diarrhea for years due to  irritable bowel syndrome.  C/o abdominal spasms today and felt faint and sweaty.  Had constipation Mon.-Thur.  Did not use a laxative, just drank water.  Has one loose stool today and a hard stool yesterday.  Hasn't eaten for 2 days.  Vomited x 1 today.

## 2013-06-16 NOTE — ED Provider Notes (Signed)
Medical screening examination/treatment/procedure(s) were performed by a resident physician or non-physician practitioner and as the supervising physician I was immediately available for consultation/collaboration.  Selby Slovacek, MD    Romayne Ticas S Kamsiyochukwu Spickler, MD 06/16/13 0838 

## 2014-05-31 ENCOUNTER — Emergency Department (HOSPITAL_COMMUNITY): Payer: Self-pay

## 2014-05-31 ENCOUNTER — Emergency Department (HOSPITAL_COMMUNITY)
Admission: EM | Admit: 2014-05-31 | Discharge: 2014-06-01 | Disposition: A | Discharge status: Home / Self Care | Payer: Self-pay | Attending: Emergency Medicine | Admitting: Emergency Medicine

## 2014-05-31 ENCOUNTER — Encounter (HOSPITAL_COMMUNITY): Payer: Self-pay | Admitting: Emergency Medicine

## 2014-05-31 DIAGNOSIS — Z8719 Personal history of other diseases of the digestive system: Secondary | ICD-10-CM | POA: Insufficient documentation

## 2014-05-31 DIAGNOSIS — Z79899 Other long term (current) drug therapy: Secondary | ICD-10-CM | POA: Insufficient documentation

## 2014-05-31 DIAGNOSIS — Z792 Long term (current) use of antibiotics: Secondary | ICD-10-CM | POA: Insufficient documentation

## 2014-05-31 DIAGNOSIS — Z720 Tobacco use: Secondary | ICD-10-CM | POA: Insufficient documentation

## 2014-05-31 DIAGNOSIS — R109 Unspecified abdominal pain: Secondary | ICD-10-CM

## 2014-05-31 DIAGNOSIS — N390 Urinary tract infection, site not specified: Secondary | ICD-10-CM | POA: Insufficient documentation

## 2014-05-31 DIAGNOSIS — R197 Diarrhea, unspecified: Secondary | ICD-10-CM | POA: Insufficient documentation

## 2014-05-31 LAB — CBC WITH DIFFERENTIAL/PLATELET
BASOS ABS: 0 10*3/uL (ref 0.0–0.1)
Basophils Relative: 0 % (ref 0–1)
EOS PCT: 0 % (ref 0–5)
Eosinophils Absolute: 0 10*3/uL (ref 0.0–0.7)
HEMATOCRIT: 43.6 % (ref 36.0–46.0)
Hemoglobin: 14.8 g/dL (ref 12.0–15.0)
Lymphocytes Relative: 11 % — ABNORMAL LOW (ref 12–46)
Lymphs Abs: 1.2 10*3/uL (ref 0.7–4.0)
MCH: 31.8 pg (ref 26.0–34.0)
MCHC: 33.9 g/dL (ref 30.0–36.0)
MCV: 93.6 fL (ref 78.0–100.0)
MONO ABS: 0.7 10*3/uL (ref 0.1–1.0)
Monocytes Relative: 7 % (ref 3–12)
Neutro Abs: 9.5 10*3/uL — ABNORMAL HIGH (ref 1.7–7.7)
Neutrophils Relative %: 82 % — ABNORMAL HIGH (ref 43–77)
PLATELETS: 234 10*3/uL (ref 150–400)
RBC: 4.66 MIL/uL (ref 3.87–5.11)
RDW: 12.5 % (ref 11.5–15.5)
WBC: 11.4 10*3/uL — ABNORMAL HIGH (ref 4.0–10.5)

## 2014-05-31 LAB — URINALYSIS, ROUTINE W REFLEX MICROSCOPIC
GLUCOSE, UA: NEGATIVE mg/dL
KETONES UR: 15 mg/dL — AB
Leukocytes, UA: NEGATIVE
Nitrite: NEGATIVE
PROTEIN: 30 mg/dL — AB
Specific Gravity, Urine: 1.034 — ABNORMAL HIGH (ref 1.005–1.030)
Urobilinogen, UA: 0.2 mg/dL (ref 0.0–1.0)
pH: 6 (ref 5.0–8.0)

## 2014-05-31 LAB — COMPREHENSIVE METABOLIC PANEL
ALBUMIN: 3.8 g/dL (ref 3.5–5.2)
ALK PHOS: 57 U/L (ref 39–117)
ALT: 8 U/L (ref 0–35)
AST: 18 U/L (ref 0–37)
Anion gap: 12 (ref 5–15)
BILIRUBIN TOTAL: 0.3 mg/dL (ref 0.3–1.2)
BUN: 11 mg/dL (ref 6–23)
CHLORIDE: 101 meq/L (ref 96–112)
CO2: 23 mEq/L (ref 19–32)
Calcium: 9.6 mg/dL (ref 8.4–10.5)
Creatinine, Ser: 0.66 mg/dL (ref 0.50–1.10)
GFR calc Af Amer: >90 mL/min (ref >90)
GFR calc non Af Amer: >90 mL/min (ref >90)
Glucose, Bld: 97 mg/dL (ref 70–99)
POTASSIUM: 3.6 meq/L — AB (ref 3.7–5.3)
Sodium: 136 mEq/L — ABNORMAL LOW (ref 137–147)
Total Protein: 8 g/dL (ref 6.0–8.3)

## 2014-05-31 LAB — URINE MICROSCOPIC-ADD ON

## 2014-05-31 LAB — POC URINE PREG, ED: Preg Test, Ur: NEGATIVE

## 2014-05-31 LAB — LIPASE, BLOOD: LIPASE: 12 U/L (ref 11–59)

## 2014-05-31 MED ORDER — MORPHINE SULFATE 4 MG/ML IJ SOLN
4.0000 mg | Freq: Once | INTRAMUSCULAR | Status: AC
  Administered 2014-05-31: 4 mg via INTRAVENOUS
  Filled 2014-05-31: qty 1

## 2014-05-31 MED ORDER — ONDANSETRON HCL 4 MG/2ML IJ SOLN
4.0000 mg | Freq: Once | INTRAMUSCULAR | Status: AC
  Administered 2014-05-31: 4 mg via INTRAVENOUS
  Filled 2014-05-31: qty 2

## 2014-05-31 MED ORDER — DEXTROSE 5 % IV SOLN
1.0000 g | Freq: Once | INTRAVENOUS | Status: AC
  Administered 2014-05-31: 1 g via INTRAVENOUS
  Filled 2014-05-31: qty 10

## 2014-05-31 MED ORDER — DICYCLOMINE HCL 20 MG PO TABS
20.0000 mg | ORAL_TABLET | Freq: Once | ORAL | Status: AC
  Administered 2014-05-31: 20 mg via ORAL
  Filled 2014-05-31: qty 1

## 2014-05-31 NOTE — ED Notes (Signed)
Pt states that her son had stomach bug then she got it. Pt c/o LLQ pain, chills, sweats, v/d. Pt states that last night she was on toilet and got dizzy. Pt states that she has been drinking ginger ale, crackers and BRAT diet.

## 2014-05-31 NOTE — ED Provider Notes (Signed)
CSN: [id number]     Arrival date & time 05/31/14  1731 History   First MD Initiated Contact with Patient 05/31/14 2008     Chief Complaint  Patient presents with  . LLQ pain    . Chills  . Diarrhea     (Consider location/radiation/quality/duration/timing/severity/associated sxs/prior Treatment) HPI  This is a 28 year old female with a past medical history of IBS who presents emergency Department with chief complaint of abdominal cramping and diarrhea. The patient states that her son had a GI virus last week. Patient developed symptoms similar to his about 4 days ago, which included nausea, watery diarrhea, stomach cramping. Patient states that she is continued to have several episodes of watery stools daily, has been feeling dizzy and lightheaded. She complains of severe left lower quadrant cramping abdominal pain. She denies vomiting. She denies melena, mucus, hematochezia, nausea, subjective fever. Past Medical History  Diagnosis Date  . Irritable bowel syndrome    History reviewed. No pertinent past surgical history. No family history on file. History  Substance Use Topics  . Smoking status: Current Some Day Smoker    Types: Cigarettes  . Smokeless tobacco: Never Used  . Alcohol Use: Yes     Comment: socially   OB History    No data available     Review of Systems  Ten systems reviewed and are negative for acute change, except as noted in the HPI.    Allergies  Review of patient's allergies indicates no known allergies.  Home Medications   Prior to Admission medications   Medication Sig Start Date End Date Taking? Authorizing Provider  cephALEXin (KEFLEX) 500 MG capsule Take 1 capsule (500 mg total) by mouth 3 (three) times daily. 09/03/12   Reuben Likesavid C Keller, MD  fluconazole (DIFLUCAN) 150 MG tablet Take 1 tablet (150 mg total) by mouth once. 09/03/12   Reuben Likesavid C Keller, MD  fluconazole (DIFLUCAN) 150 MG tablet Take 1 tablet (150 mg total) by mouth once. Repeat in 1 week  if needed 03/18/13   Linna HoffJames D Kindl, MD  medroxyPROGESTERone (DEPO-PROVERA) 150 MG/ML injection Inject 150 mg into the muscle every 3 (three) months.    Historical Provider, MD  metroNIDAZOLE (FLAGYL) 500 MG tablet Take 1 tablet (500 mg total) by mouth 2 (two) times daily. 09/07/12   Reuben Likesavid C Keller, MD  metroNIDAZOLE (FLAGYL) 500 MG tablet Take 1 tablet (500 mg total) by mouth 2 (two) times daily. 03/18/13   Linna HoffJames D Kindl, MD  polyethylene glycol powder (GLYCOLAX/MIRALAX) powder Take 255 g (1 Container total) by mouth once. 06/15/13   Lonia SkinnerStephanie E Losq, MD  promethazine (PHENERGAN) 25 MG tablet Take 1 tablet (25 mg total) by mouth every 6 (six) hours as needed for nausea or vomiting. 06/15/13   Lonia SkinnerStephanie E Losq, MD   BP 104/76 mmHg  Pulse 100  Temp(Src) 98.3 F (36.8 C) (Oral)  Resp 16  SpO2 100% Physical Exam  Constitutional: She is oriented to person, place, and time. She appears well-developed and well-nourished. No distress.  HENT:  Head: Normocephalic and atraumatic.  Eyes: Conjunctivae are normal. No scleral icterus.  Neck: Normal range of motion.  Cardiovascular: Normal rate, regular rhythm and normal heart sounds.  Exam reveals no gallop and no friction rub.   No murmur heard. Pulmonary/Chest: Effort normal and breath sounds normal. No respiratory distress.  Abdominal: Soft. Bowel sounds are normal. She exhibits no distension and no mass. There is tenderness (diffuse). There is no guarding.  Neurological: She is alert  and oriented to person, place, and time.  Skin: Skin is warm and dry. She is not diaphoretic.    ED Course  Procedures (including critical care time) Labs Review Labs Reviewed  COMPREHENSIVE METABOLIC PANEL - Abnormal; Notable for the following:    Sodium 136 (*)    Potassium 3.6 (*)    All other components within normal limits  CBC WITH DIFFERENTIAL - Abnormal; Notable for the following:    WBC 11.4 (*)    Neutrophils Relative % 82 (*)    Neutro Abs 9.5 (*)     Lymphocytes Relative 11 (*)    All other components within normal limits  URINALYSIS, ROUTINE W REFLEX MICROSCOPIC - Abnormal; Notable for the following:    Color, Urine AMBER (*)    APPearance TURBID (*)    Specific Gravity, Urine 1.034 (*)    Hgb urine dipstick LARGE (*)    Bilirubin Urine SMALL (*)    Ketones, ur 15 (*)    Protein, ur 30 (*)    All other components within normal limits  URINE MICROSCOPIC-ADD ON - Abnormal; Notable for the following:    Squamous Epithelial / LPF FEW (*)    Bacteria, UA MANY (*)    All other components within normal limits  LIPASE, BLOOD  POC URINE PREG, ED    Imaging Review No results found.   EKG Interpretation None      MDM   Final diagnoses:  Diarrhea  Abdominal cramps  UTI (lower urinary tract infection)     Patient here with elevated white count,  Afebrile. ua appears concerning for urinary tract infection. I have ordered IV Rocephin and fluids.   Patient asking for food and fluids. She asked her boyfriend to pick her up a fish fillet sandwich from [employer]. Serial exams show no focal abdominal tenderness. Acute abdominal series shows a 2 mm nonobstructing questionable right renal calculus. Patient does not show any signs of renal colic. Patient's predominant complaint is on the left. Patient is nontoxic, nonseptic appearing, in no apparent distress.  Patient's pain and other symptoms adequately managed in emergency department.  Fluid bolus given.  Labs, imaging and vitals reviewed.  Patient does not meet the SIRS or Sepsis criteria.  On repeat exam patient does not have a surgical abdomin and there are no peritoneal signs.  No indication of appendicitis, bowel obstruction, bowel perforation, cholecystitis, diverticulitis, PID or ectopic pregnancy.  Patient discharged home with symptomatic treatment and given strict instructions for follow-up with their primary care physician.  I have also discussed reasons to return immediately to  the ER.  Patient expresses understanding and agrees with plan.     Arthor CaptainAbigail Chetara Kropp, PA-C 06/01/14 0020  Ward GivensIva L Knapp, MD 06/06/14 [phone]

## 2014-05-31 NOTE — ED Notes (Signed)
Patient transported to X-ray 

## 2014-06-01 LAB — URINE CULTURE

## 2014-06-01 MED ORDER — DICYCLOMINE HCL 20 MG PO TABS: 20.0000 mg | ORAL_TABLET | Freq: Two times a day (BID) | ORAL | Status: DC

## 2014-06-01 MED ORDER — CEPHALEXIN 500 MG PO CAPS: 500.0000 mg | ORAL_CAPSULE | Freq: Three times a day (TID) | ORAL | Status: DC

## 2014-06-01 MED ORDER — NAPROXEN 500 MG PO TABS: 500.0000 mg | ORAL_TABLET | Freq: Two times a day (BID) | ORAL | Status: DC

## 2014-06-01 MED ORDER — LOPERAMIDE HCL 2 MG PO CAPS: 2.0000 mg | ORAL_CAPSULE | Freq: Four times a day (QID) | ORAL | Status: DC | PRN

## 2014-06-01 NOTE — Discharge Instructions (Signed)
Abdominal (belly) pain can be caused by many things. Your caregiver performed an examination and possibly ordered blood/urine tests and imaging (CT scan, x-rays, ultrasound). Many cases can be observed and treated at home after initial evaluation in the emergency department. Even though you are being discharged home, abdominal pain can be unpredictable. Therefore, you need a repeated exam if your pain does not resolve, returns, or worsens. Most patients with abdominal pain don't have to be admitted to the hospital or have surgery, but serious problems like appendicitis and gallbladder attacks can start out as nonspecific pain. Many abdominal conditions cannot be diagnosed in one visit, so follow-up evaluations are very important. SEEK IMMEDIATE MEDICAL ATTENTION IF: The pain does not go away or becomes severe.  A temperature above 101 develops.  Repeated vomiting occurs (multiple episodes).  The pain becomes localized to portions of the abdomen. The right side could possibly be appendicitis. In an adult, the left lower portion of the abdomen could be colitis or diverticulitis.  Blood is being passed in stools or vomit (bright red or black tarry stools).  Return also if you develop chest pain, difficulty breathing, dizziness or fainting, or become confused, poorly responsive, or inconsolable (young children).   Diarrhea Diarrhea is frequent loose and watery bowel movements. It can cause you to feel weak and dehydrated. Dehydration can cause you to become tired and thirsty, have a dry mouth, and have decreased urination that often is dark yellow. Diarrhea is a sign of another problem, most often an infection that will not last long. In most cases, diarrhea typically lasts 2-3 days. However, it can last longer if it is a sign of something more serious. It is important to treat your diarrhea as directed by your caregiver to lessen or prevent future episodes of diarrhea. CAUSES  Some common causes  include:  Gastrointestinal infections caused by viruses, bacteria, or parasites.  Food poisoning or food allergies.  Certain medicines, such as antibiotics, chemotherapy, and laxatives.  Artificial sweeteners and fructose.  Digestive disorders. HOME CARE INSTRUCTIONS  Ensure adequate fluid intake (hydration): Have 1 cup (8 oz) of fluid for each diarrhea episode. Avoid fluids that contain simple sugars or sports drinks, fruit juices, whole milk products, and sodas. Your urine should be clear or pale yellow if you are drinking enough fluids. Hydrate with an oral rehydration solution that you can purchase at pharmacies, retail stores, and online. You can prepare an oral rehydration solution at home by mixing the following ingredients together:   - tsp table salt.   tsp baking soda.   tsp salt substitute containing potassium chloride.  1  tablespoons sugar.  1 L (34 oz) of water.  Certain foods and beverages may increase the speed at which food moves through the gastrointestinal (GI) tract. These foods and beverages should be avoided and include:  Caffeinated and alcoholic beverages.  High-fiber foods, such as raw fruits and vegetables, nuts, seeds, and whole grain breads and cereals.  Foods and beverages sweetened with sugar alcohols, such as xylitol, sorbitol, and mannitol.  Some foods may be well tolerated and may help thicken stool including:  Starchy foods, such as rice, toast, pasta, low-sugar cereal, oatmeal, grits, baked potatoes, crackers, and bagels.  Bananas.  Applesauce.  Add probiotic-rich foods to help increase healthy bacteria in the GI tract, such as yogurt and fermented milk products.  Wash your hands well after each diarrhea episode.  Only take over-the-counter or prescription medicines as directed by your caregiver.  Take a  warm bath to relieve any burning or pain from frequent diarrhea episodes. SEEK IMMEDIATE MEDICAL CARE IF:   You are unable to  keep fluids down.  You have persistent vomiting.  You have blood in your stool, or your stools are black and tarry.  You do not urinate in 6-8 hours, or there is only a small amount of very dark urine.  You have abdominal pain that increases or localizes.  You have weakness, dizziness, confusion, or light-headedness.  You have a severe headache.  Your diarrhea gets worse or does not get better.  You have a fever or persistent symptoms for more than 2-3 days.  You have a fever and your symptoms suddenly get worse. MAKE SURE YOU:   Understand these instructions.  Will watch your condition.  Will get help right away if you are not doing well or get worse. Document Released: 06/20/2002 Document Revised: 11/14/2013 Document Reviewed: 03/07/2012 [facility] Patient Information 2015 [place], [place]. This information is not intended to replace advice given to you by your health care provider. Make sure you discuss any questions you have with your health care provider. Urinary Tract Infection Urinary tract infections (UTIs) can develop anywhere along your urinary tract. Your urinary tract is your body's drainage system for removing wastes and extra water. Your urinary tract includes two kidneys, two ureters, a bladder, and a urethra. Your kidneys are a pair of bean-shaped organs. Each kidney is about the size of your fist. They are located below your ribs, one on each side of your spine. CAUSES Infections are caused by microbes, which are microscopic organisms, including fungi, viruses, and bacteria. These organisms are so small that they can only be seen through a microscope. Bacteria are the microbes that most commonly cause UTIs. SYMPTOMS  Symptoms of UTIs may vary by age and gender of the patient and by the location of the infection. Symptoms in young women typically include a frequent and intense urge to urinate and a painful, burning feeling in the bladder or urethra during urination.  Older women and men are more likely to be tired, shaky, and weak and have muscle aches and abdominal pain. A fever may mean the infection is in your kidneys. Other symptoms of a kidney infection include pain in your back or sides below the ribs, nausea, and vomiting. DIAGNOSIS To diagnose a UTI, your caregiver will ask you about your symptoms. Your caregiver also will ask to provide a urine sample. The urine sample will be tested for bacteria and white blood cells. White blood cells are made by your body to help fight infection. TREATMENT  Typically, UTIs can be treated with medication. Because most UTIs are caused by a bacterial infection, they usually can be treated with the use of antibiotics. The choice of antibiotic and length of treatment depend on your symptoms and the type of bacteria causing your infection. HOME CARE INSTRUCTIONS  If you were prescribed antibiotics, take them exactly as your caregiver instructs you. Finish the medication even if you feel better after you have only taken some of the medication.  Drink enough water and fluids to keep your urine clear or pale yellow.  Avoid caffeine, tea, and carbonated beverages. They tend to irritate your bladder.  Empty your bladder often. Avoid holding urine for long periods of time.  Empty your bladder before and after sexual intercourse.  After a bowel movement, women should cleanse from front to back. Use each tissue only once. SEEK MEDICAL CARE IF:  You have back pain.  You develop a fever.  Your symptoms do not begin to resolve within 3 days. SEEK IMMEDIATE MEDICAL CARE IF:   You have severe back pain or lower abdominal pain.  You develop chills.  You have nausea or vomiting.  You have continued burning or discomfort with urination. MAKE SURE YOU:   Understand these instructions.  Will watch your condition.  Will get help right away if you are not doing well or get worse. Document Released: 04/09/2005 Document  Revised: 12/30/2011 Document Reviewed: 08/08/2011 [facility] Patient Information 2015 [place]. This information is not intended to replace advice given to you by your health care provider. Make sure you discuss any questions you have with your health care provider.

## 2014-10-31 ENCOUNTER — Encounter (HOSPITAL_COMMUNITY): Payer: Self-pay

## 2014-10-31 ENCOUNTER — Emergency Department (HOSPITAL_COMMUNITY)
Admission: EM | Admit: 2014-10-31 | Discharge: 2014-10-31 | Disposition: A | Discharge status: Home / Self Care | Payer: Self-pay | Attending: Emergency Medicine | Admitting: Emergency Medicine

## 2014-10-31 DIAGNOSIS — Z79899 Other long term (current) drug therapy: Secondary | ICD-10-CM | POA: Insufficient documentation

## 2014-10-31 DIAGNOSIS — Z791 Long term (current) use of non-steroidal anti-inflammatories (NSAID): Secondary | ICD-10-CM | POA: Insufficient documentation

## 2014-10-31 DIAGNOSIS — R112 Nausea with vomiting, unspecified: Secondary | ICD-10-CM | POA: Insufficient documentation

## 2014-10-31 DIAGNOSIS — Z720 Tobacco use: Secondary | ICD-10-CM | POA: Insufficient documentation

## 2014-10-31 DIAGNOSIS — R197 Diarrhea, unspecified: Secondary | ICD-10-CM | POA: Insufficient documentation

## 2014-10-31 DIAGNOSIS — K589 Irritable bowel syndrome without diarrhea: Secondary | ICD-10-CM | POA: Insufficient documentation

## 2014-10-31 DIAGNOSIS — Z3202 Encounter for pregnancy test, result negative: Secondary | ICD-10-CM | POA: Insufficient documentation

## 2014-10-31 LAB — URINALYSIS, ROUTINE W REFLEX MICROSCOPIC
BILIRUBIN URINE: NEGATIVE
Glucose, UA: NEGATIVE mg/dL
KETONES UR: 15 mg/dL — AB
Leukocytes, UA: NEGATIVE
NITRITE: NEGATIVE
PROTEIN: NEGATIVE mg/dL
Specific Gravity, Urine: 1.025 (ref 1.005–1.030)
Urobilinogen, UA: 1 mg/dL (ref 0.0–1.0)
pH: 6.5 (ref 5.0–8.0)

## 2014-10-31 LAB — COMPREHENSIVE METABOLIC PANEL
ALBUMIN: 4.6 g/dL (ref 3.5–5.2)
ALT: 21 U/L (ref 0–35)
AST: 30 U/L (ref 0–37)
Alkaline Phosphatase: 54 U/L (ref 39–117)
Anion gap: 6 (ref 5–15)
BILIRUBIN TOTAL: 0.7 mg/dL (ref 0.3–1.2)
BUN: 14 mg/dL (ref 6–23)
CO2: 23 mmol/L (ref 19–32)
CREATININE: 0.72 mg/dL (ref 0.50–1.10)
Calcium: 8.7 mg/dL (ref 8.4–10.5)
Chloride: 108 mmol/L (ref 96–112)
GFR calc non Af Amer: >90 mL/min (ref >90)
GLUCOSE: 82 mg/dL (ref 70–99)
POTASSIUM: 3.6 mmol/L (ref 3.5–5.1)
Sodium: 137 mmol/L (ref 135–145)
TOTAL PROTEIN: 7.8 g/dL (ref 6.0–8.3)

## 2014-10-31 LAB — CBC WITH DIFFERENTIAL/PLATELET
Basophils Absolute: 0 10*3/uL (ref 0.0–0.1)
Basophils Relative: 0 % (ref 0–1)
EOS ABS: 0 10*3/uL (ref 0.0–0.7)
EOS PCT: 0 % (ref 0–5)
HEMATOCRIT: 44.9 % (ref 36.0–46.0)
HEMOGLOBIN: 15.1 g/dL — AB (ref 12.0–15.0)
Lymphocytes Relative: 12 % (ref 12–46)
Lymphs Abs: 1.5 10*3/uL (ref 0.7–4.0)
MCH: 31.7 pg (ref 26.0–34.0)
MCHC: 33.6 g/dL (ref 30.0–36.0)
MCV: 94.3 fL (ref 78.0–100.0)
Monocytes Absolute: 0.7 10*3/uL (ref 0.1–1.0)
Monocytes Relative: 5 % (ref 3–12)
NEUTROS ABS: 10.6 10*3/uL — AB (ref 1.7–7.7)
Neutrophils Relative %: 83 % — ABNORMAL HIGH (ref 43–77)
Platelets: 236 10*3/uL (ref 150–400)
RBC: 4.76 MIL/uL (ref 3.87–5.11)
RDW: 12.8 % (ref 11.5–15.5)
WBC: 12.8 10*3/uL — ABNORMAL HIGH (ref 4.0–10.5)

## 2014-10-31 LAB — POC URINE PREG, ED: PREG TEST UR: NEGATIVE

## 2014-10-31 LAB — URINE MICROSCOPIC-ADD ON

## 2014-10-31 MED ORDER — ONDANSETRON 4 MG PO TBDP
4.0000 mg | ORAL_TABLET | Freq: Once | ORAL | Status: AC
  Administered 2014-10-31: 4 mg via ORAL
  Filled 2014-10-31: qty 1

## 2014-10-31 MED ORDER — ONDANSETRON HCL 4 MG PO TABS: 4.0000 mg | ORAL_TABLET | Freq: Three times a day (TID) | ORAL | Status: DC | PRN

## 2014-10-31 MED ORDER — ONDANSETRON HCL 4 MG/2ML IJ SOLN
4.0000 mg | Freq: Once | INTRAMUSCULAR | Status: DC
  Filled 2014-10-31: qty 2

## 2014-10-31 MED ORDER — SODIUM CHLORIDE 0.9 % IV BOLUS (SEPSIS): 1000.0000 mL | Freq: Once | INTRAVENOUS | Status: DC

## 2014-10-31 NOTE — ED Notes (Signed)

## 2014-10-31 NOTE — ED Notes (Signed)
Pt refused to have the pelvic done. Pt stated that she "thinks she has a UTI and a pelvic exam is unnecessary, that she can have that done at her [facility]". Pt would also like something for nausea and a ginger ale.  I informed the pt that if she is nauseous then we wouldn't give her anything to eat or drink.

## 2014-10-31 NOTE — ED Notes (Signed)
Patient asking for food/drink  Informed patient that due to her c/o abdominal pain and emesis and that she has not been seen by EDP as of yet, that she is to be NPO Patient has remained in NAD and without episodes of emesis

## 2014-10-31 NOTE — ED Notes (Signed)
Pt presents with c/o abdominal pain and vomiting that started approx 3 days ago. Pt reports the abdominal pain is on her lower left side. Pt reports some diarrhea as well.

## 2014-10-31 NOTE — Discharge Instructions (Signed)
Diarrhea °Diarrhea is frequent loose and watery bowel movements. It can cause you to feel weak and dehydrated. Dehydration can cause you to become tired and thirsty, have a dry mouth, and have decreased urination that often is dark yellow. Diarrhea is a sign of another problem, most often an infection that will not last long. In most cases, diarrhea typically lasts 2-3 days. However, it can last longer if it is a sign of something more serious. It is important to treat your diarrhea as directed by your caregiver to lessen or prevent future episodes of diarrhea. °CAUSES  °Some common causes include: °· Gastrointestinal infections caused by viruses, bacteria, or parasites. °· Food poisoning or food allergies. °· Certain medicines, such as antibiotics, chemotherapy, and laxatives. °· Artificial sweeteners and fructose. °· Digestive disorders. °HOME CARE INSTRUCTIONS °· Ensure adequate fluid intake (hydration): Have 1 cup (8 oz) of fluid for each diarrhea episode. Avoid fluids that contain simple sugars or sports drinks, fruit juices, whole milk products, and sodas. Your urine should be clear or pale yellow if you are drinking enough fluids. Hydrate with an oral rehydration solution that you can purchase at pharmacies, retail stores, and online. You can prepare an oral rehydration solution at home by mixing the following ingredients together: °·  - tsp table salt. °· ¾ tsp baking soda. °·  tsp salt substitute containing potassium chloride. °· 1  tablespoons sugar. °· 1 L (34 oz) of water. °· Certain foods and beverages may increase the speed at which food moves through the gastrointestinal (GI) tract. These foods and beverages should be avoided and include: °· Caffeinated and alcoholic beverages. °· High-fiber foods, such as raw fruits and vegetables, nuts, seeds, and whole grain breads and cereals. °· Foods and beverages sweetened with sugar alcohols, such as xylitol, sorbitol, and mannitol. °· Some foods may be well  tolerated and may help thicken stool including: °· Starchy foods, such as rice, toast, pasta, low-sugar cereal, oatmeal, grits, baked potatoes, crackers, and bagels. °· Bananas. °· Applesauce. °· Add probiotic-rich foods to help increase healthy bacteria in the GI tract, such as yogurt and fermented milk products. °· Wash your hands well after each diarrhea episode. °· Only take over-the-counter or prescription medicines as directed by your caregiver. °· Take a warm bath to relieve any burning or pain from frequent diarrhea episodes. °SEEK IMMEDIATE MEDICAL CARE IF:  °· You are unable to keep fluids down. °· You have persistent vomiting. °· You have blood in your stool, or your stools are black and tarry. °· You do not urinate in 6-8 hours, or there is only a small amount of very dark urine. °· You have abdominal pain that increases or localizes. °· You have weakness, dizziness, confusion, or light-headedness. °· You have a severe headache. °· Your diarrhea gets worse or does not get better. °· You have a fever or persistent symptoms for more than 2-3 days. °· You have a fever and your symptoms suddenly get worse. °MAKE SURE YOU:  °· Understand these instructions. °· Will watch your condition. °· Will get help right away if you are not doing well or get worse. °Document Released: 06/20/2002 Document Revised: 11/14/2013 Document Reviewed: 03/07/2012 °ExitCare® Patient Information ©2015 [employer]. This information is not intended to replace advice given to you by your health care provider. Make sure you discuss any questions you have with your health care provider. ° °Nausea and Vomiting °Nausea is a sick feeling that often comes before throwing up (vomiting). Vomiting   is a reflex where stomach contents come out of your mouth. Vomiting can cause severe loss of body fluids (dehydration). Children and elderly adults can become dehydrated quickly, especially if they also have diarrhea. Nausea and vomiting are  symptoms of a condition or disease. It is important to find the cause of your symptoms. CAUSES   Direct irritation of the stomach lining. This irritation can result from increased acid production (gastroesophageal reflux disease), infection, food poisoning, taking certain medicines (such as nonsteroidal anti-inflammatory drugs), alcohol use, or tobacco use.  Signals from the brain.These signals could be caused by a headache, heat exposure, an inner ear disturbance, increased pressure in the brain from injury, infection, a tumor, or a concussion, pain, emotional stimulus, or metabolic problems.  An obstruction in the gastrointestinal tract (bowel obstruction).  Illnesses such as diabetes, hepatitis, gallbladder problems, appendicitis, kidney problems, cancer, sepsis, atypical symptoms of a heart attack, or eating disorders.  Medical treatments such as chemotherapy and radiation.  Receiving medicine that makes you sleep (general anesthetic) during surgery. DIAGNOSIS Your caregiver may ask for tests to be done if the problems do not improve after a few days. Tests may also be done if symptoms are severe or if the reason for the nausea and vomiting is not clear. Tests may include:  Urine tests.  Blood tests.  Stool tests.  Cultures (to look for evidence of infection).  X-rays or other imaging studies. Test results can help your caregiver make decisions about treatment or the need for additional tests. TREATMENT You need to stay well hydrated. Drink frequently but in small amounts.You may wish to drink water, sports drinks, clear broth, or eat frozen ice pops or gelatin dessert to help stay hydrated.When you eat, eating slowly may help prevent nausea.There are also some antinausea medicines that may help prevent nausea. HOME CARE INSTRUCTIONS   Take all medicine as directed by your caregiver.  If you do not have an appetite, do not force yourself to eat. However, you must continue to  drink fluids.  If you have an appetite, eat a normal diet unless your caregiver tells you differently.  Eat a variety of complex carbohydrates (rice, wheat, potatoes, bread), lean meats, yogurt, fruits, and vegetables.  Avoid high-fat foods because they are more difficult to digest.  Drink enough water and fluids to keep your urine clear or pale yellow.  If you are dehydrated, ask your caregiver for specific rehydration instructions. Signs of dehydration may include:  Severe thirst.  Dry lips and mouth.  Dizziness.  Dark urine.  Decreasing urine frequency and amount.  Confusion.  Rapid breathing or pulse. SEEK IMMEDIATE MEDICAL CARE IF:   You have blood or brown flecks (like coffee grounds) in your vomit.  You have black or bloody stools.  You have a severe headache or stiff neck.  You are confused.  You have severe abdominal pain.  You have chest pain or trouble breathing.  You do not urinate at least once every 8 hours.  You develop cold or clammy skin.  You continue to vomit for longer than 24 to 48 hours.  You have a fever. MAKE SURE YOU:   Understand these instructions.  Will watch your condition.  Will get help right away if you are not doing well or get worse. Document Released: 06/30/2005 Document Revised: 09/22/2011 Document Reviewed: 11/27/2010 [facility] Patient Information 2015 [place], [place]. This information is not intended to replace advice given to you by your health care provider. Make sure you discuss  any questions you have with your health care provider.   Emergency Department Resource Guide 1) Find a Doctor and Pay Out of Pocket Although you won't have to find out who is covered by your insurance plan, it is a good idea to ask around and get recommendations. You will then need to call the office and see if the doctor you have chosen will accept you as a new patient and what types of options they offer for patients who are self-pay. Some  doctors offer discounts or will set up payment plans for their patients who do not have insurance, but you will need to ask so you aren't surprised when you get to your appointment.  2) Contact Your Local Health Department Not all health departments have doctors that can see patients for sick visits, but many do, so it is worth a call to see if yours does. If you don't know where your local health department is, you can check in your phone book. The CDC also has a tool to help you locate your state's health department, and many state websites also have listings of all of their local health departments.  3) Find a Walk-in Clinic If your illness is not likely to be very severe or complicated, you may want to try a walk in clinic. These are popping up all over the country in pharmacies, drugstores, and shopping centers. They're usually staffed by nurse practitioners or physician assistants that have been trained to treat common illnesses and complaints. They're usually fairly quick and inexpensive. However, if you have serious medical issues or chronic medical problems, these are probably not your best option.  No Primary Care Doctor: - Call Health Connect at  [phone] - they can help you locate a primary care doctor that  accepts your insurance, provides certain services, etc. - Physician Referral Service- [phone]  Chronic Pain Problems: Organization         Address  Phone   Notes  [facility]  [phone] Patients need to be referred by their primary care doctor.   Medication Assistance: Organization         Address  Phone   Notes  [facility] [facility] [address] [place], [place] [address] [phone] --Must be a resident of [place] -- Must have NO insurance coverage whatsoever (no Medicaid/ Medicare, etc.) -- The pt. MUST have a primary care doctor that directs their care regularly and follows them in the  community   Med[facility]ist  [phone]   [employer]  [phone]    Agencies that provide inexpensive medical care: Organization         Address  Phone   Notes  [facility]  [phone]) [phone]   [facility]    [phone]   [facility] [address] [place], [place] [address] [phone]   [facility] [address], [place] [phone]   [facility]    [phone]   [facility]    [phone]   [facility]  [address], [place] Phone:  [phone], Fax:  [phone] Hours of Operation:  9 am - 6 pm, M-F.  Also accepts Medicaid/Medicare and self-pay.  [facility]  [address], [place] Phone: [phone], Fax: [phone]. Hours of Operation:  8:30 am - 5:30 pm,  Also accepts Medicaid and self-pay.  °[facility] [place] [address], [place] Phone: [phone]   °[facility] [address], [place], [place] [phone], Ext. 123 Mondays & Thursdays: 7-9 AM.  First 15 patients are seen on a first come, first serve basis. °  ° °Medicaid-accepting [place] Providers: ° °Organization         Address  Phone   Notes  °[facility] [address], [place] [phone] Also accepts self-pay patients.  °[facility] [address], [place] ° ([phone]) [phone]   °[facility] [address], [place] [phone]) [phone]   °[facility] [address] [place] [address], [place] [phone]) [phone]   °Veita Bland [address], [place]  ° [phone]) [phone] Only accepts [place] Access Medicaid patients after they have their name applied to their card.  ° °Self-Pay (no insurance) in [place]: ° °Organization         Address  Phone   Notes  °Sickle Cell Patients, [facility] [address], [place] [phone]) [phone]   °[facility] [address], [place] ([phone]) [phone]   °[facility] ° [address], [place] ([phone]) [phone]   °[facility]/Dr. Osei-Bonsu ° [address] [place] [address], [place] or [address], [place] [phone]) [phone] Phone number for both [place] and [place] locations is the same.  °[facility] [address], [place] [phone]) [phone]   °[facility] [address] [place] [address], [place] or [address] ([phone]) [phone] °([phone]) [phone]   °[facility] [address], [place] ([phone]) [phone], phone; [phone], fax Sees patients 1st and 3rd Saturday of every month.  Must not qualify for public or private insurance (i.e. Medicaid, Medicare, [place] Health Choice, Veterans' Benefits) • Household income should be no more than 200% of the poverty level •The clinic cannot treat you if you are pregnant or think you are pregnant • Sexually transmitted diseases are not treated at the clinic.  ° ° °Dental Care: °Organization         Address  Phone  Notes  °[place] [facility] [address], [place] [phone]) [phone] Accepts children up to age 21 who are enrolled in Medicaid or [place] Health Choice; pregnant women with a Medicaid card; and children who have applied for Medicaid or [place] Health Choice, but were declined, whose parents can pay a reduced fee at time of service.  °[place] [facility] [place]  [address], [place] [phone]) [phone] Accepts children up to age 21 who are enrolled in Medicaid or [place] Health Choice; pregnant women with a Medicaid card; and children who have applied for Medicaid or [place] Health Choice, but were declined, whose parents can pay a reduced fee at time of service.  °[facility] ° [address],  ([phone]) [phone] Patients are seen by appointment only. Walk-ins are not accepted. [facility] will see patients 18 years of age and older. °Monday - Tuesday (8am-5pm) °Most Wednesdays (8:30-5pm) °$30 per visit, cash only  °[facility] ° [address], [place] [phone]) [phone] Patients are seen by appointment only. Walk-ins are not accepted. [facility]   will see patients 18 years of age and older. °One Wednesday Evening (Monthly: Volunteer Based).  $30 per visit, cash only  °[facility]  [phone] for adults; Children under age 4, call Graduate Pediatric Dentistry at [phone]. Children aged 4-14, please call [phone] to request a pediatric application. ° Dental services are provided in all areas of dental care including fillings, crowns and bridges, complete and partial dentures, implants, gum treatment, root canals, and extractions. Preventive care is also provided. Treatment is provided to both adults and children. °Patients are selected via a lottery and there is often a waiting list. °  °[facility] [address], °[place] ° ([phone]) [phone] [email or web address] °  °[facility] [address], [place], [place] (336)[phone], Ext. 123 Second and Fourth Thursday of each month, opens at 6:30 AM; Clinic ends at 9 AM.  Patients are seen on a first-come first-served basis, and a limited number are seen during each clinic.  ° °[facility] ° [address], [place], [place] [phone]) [phone]   Eligibility Requirements °You must have lived in [place], [place], or [place] counties for at least the last three months. °  You cannot be eligible for state or federal sponsored healthcare insurance, including Veterans Administration, Medicaid, or Medicare. °  You generally cannot be eligible for healthcare insurance through your employer.  °  How to apply: °Eligibility screenings are held every  Tuesday and Wednesday afternoon from 1:00 pm until 4:00 pm. You do not need an appointment for the interview!  °[facility] [address], [place], [place] [phone]   °[facility]  [phone]   °[place] [facility]  336-703-3100   ° [facility]  336-570-6415   ° °Behavioral Health Resources in the Community: °Intensive Outpatient Programs °Organization         Address  Phone  Notes  °[facility] [address], [place], [place] [phone]   °[facility] [address], [place], [place] [phone]   °[facility] [address], [place], [place] ° 336-882-2125   °[place] [address],  °[place], [place] 1-800-853-5163 or 336-641-4981   °Substance Abuse Resources °Organization         Address  Phone  Notes  °[facility]  336-882-2125   °[facility]  336-784-9470   °[facility] [place] [facility]  336-285-9073   °[facility]  336-845-3988   °Residential & Outpatient Substance Abuse Program  1-800-659-3381   °Psychological Services °Organization         Address  Phone  Notes  °[facility]  336- 832-9600   °[facility]  336- 378-7881   °[facility] [address], [place] 1-800-853-5163 or 336-641-4981   ° °Mobile Crisis Teams °Organization         Address  Phone  Notes  °[facility], Mobile Crisis Care Unit  1-877-626-1772   °Assertive °[facility] ° [address] [place], [place] 336-834-9664   °Sharon DeEsch [address] °[place] 336-554-5454   ° °Self-Help/Support Groups °Organization         Address  Phone             Notes  °[facility] - variety of support groups  336- 373-1402 Call for more information  °Narcotics Anonymous (NA), [facility] [address], °  [address], [place]  2 meetings at this location    Residential Treatment Programs Organization         Address  Phone  Notes  [facility] [address],    [place]  [phone]   [facility]  [address], [place] [phone], [place], [place] [phone]   [facility] [address], [place] [phone] Admissions: 8am-3pm M-F  [facility] [address]    [place], [place] [phone]   [facility] [address], [place], [place] [phone]   [facility] [address]  [place], [place] [phone]   [facility] Intensive Outpatient [address], [place], [place] [phone]   [facility] [address]  [place], [place] [phone] or [phone]   [facility] ([facility]) [address] [place], [place] [phone] Accepts Medicaid  [facility] [address]  [place] [phone] Substance Abuse/Addiction Treatment   [facility] Resources Organization         Address  Phone  Notes  [facility]  [phone](888) [phone]   Angie FavaJulie Brannon, PhD [address], [address] [place], [place]   [phone]) [phone] or [phone]) [phone]   [facility]   [address] [place], [place] [phone]) [phone]   [facility] [address], [place], [place] [phone](336) [phone] Insurance/Medicaid/sponsorship through [facility]  [facility] [address]                                    [place], [place] [phone](336) [phone] Therapy/tele-psych/case  [facility] [address].   [place], [place] [phone]) [phone]    Dr. Lolly MustacheArfeen  [phone]) [phone]   [facility] [place]  [employer] [facility] 1) [address], [place] 2) [address], [place] 3)  [address], [place] [phone]  [phone]) [phone]   [facility] Child Abuse Hotline [phone](336) [phone])[phone] or [phone]) [phone] (After  Hours)     Food Choices to Help Relieve Diarrhea When you have diarrhea, the foods you eat and your eating habits are very important. Choosing the right foods and drinks can help relieve diarrhea. Also, because diarrhea can last up to 7 days, you need to replace lost fluids and electrolytes (such as sodium, potassium, and chloride) in order to help prevent dehydration.  WHAT GENERAL GUIDELINES DO I NEED TO FOLLOW?  Slowly drink 1 cup (8 oz) of fluid for each episode of diarrhea. If you are getting enough fluid, your urine will be clear or pale yellow.  Eat starchy foods. Some good choices include white rice, white toast, pasta, low-fiber cereal, baked potatoes (without the skin), saltine crackers, and bagels.  Avoid large servings of any cooked vegetables.  Limit fruit to two servings per day. A serving is  cup or 1 small piece.  Choose foods with less than 2 g of fiber per serving.  Limit fats to less than 8 tsp (38 g) per day.  Avoid fried foods.  Eat foods that have probiotics in them. Probiotics can be found in certain dairy products.  Avoid foods and beverages that may increase the speed at which food moves through the stomach and intestines (gastrointestinal tract). Things to avoid include:  High-fiber foods, such as  dried fruit, raw fruits and vegetables, nuts, seeds, and whole grain foods.  Spicy foods and high-fat foods.  Foods and beverages sweetened with high-fructose corn syrup, honey, or sugar alcohols such as xylitol, sorbitol, and mannitol. WHAT FOODS ARE RECOMMENDED? Grains White rice. White, [place], or pita breads (fresh or toasted), including plain rolls, buns, or bagels. White pasta. Saltine, soda, or graham crackers. Pretzels. Low-fiber cereal. Cooked cereals made with water (such as cornmeal, farina, or cream cereals). Plain muffins. Matzo. Melba toast. Zwieback.  Vegetables Potatoes (without the skin). Strained tomato and vegetable juices. Most well-cooked and  canned vegetables without seeds. Tender lettuce. Fruits Cooked or canned applesauce, apricots, cherries, fruit cocktail, grapefruit, peaches, pears, or plums. Fresh bananas, apples without skin, cherries, grapes, cantaloupe, grapefruit, peaches, oranges, or plums.  Meat and Other Protein Products Baked or boiled chicken. Eggs. Tofu. Fish. Seafood. Smooth peanut butter. Ground or well-cooked tender beef, ham, veal, lamb, pork, or poultry.  Dairy Plain yogurt, kefir, and unsweetened liquid yogurt. Lactose-free milk, buttermilk, or soy milk. Plain hard cheese. Beverages Sport drinks. Clear broths. Diluted fruit juices (except prune). Regular, caffeine-free sodas such as ginger ale. Water. Decaffeinated teas. Oral rehydration solutions. Sugar-free beverages not sweetened with sugar alcohols. Other Bouillon, broth, or soups made from recommended foods.  The items listed above may not be a complete list of recommended foods or beverages. Contact your dietitian for more options. WHAT FOODS ARE NOT RECOMMENDED? Grains Whole grain, whole wheat, bran, or rye breads, rolls, pastas, crackers, and cereals. Wild or brown rice. Cereals that contain more than 2 g of fiber per serving. Corn tortillas or taco shells. Cooked or dry oatmeal. Granola. Popcorn. Vegetables Raw vegetables. Cabbage, broccoli, Brussels sprouts, artichokes, baked beans, beet greens, corn, kale, legumes, peas, sweet potatoes, and yams. Potato skins. Cooked spinach and cabbage. Fruits Dried fruit, including raisins and dates. Raw fruits. Stewed or dried prunes. Fresh apples with skin, apricots, mangoes, pears, raspberries, and strawberries.  Meat and Other Protein Products Chunky peanut butter. Nuts and seeds. Beans and lentils. Tomasa Blase.  Dairy High-fat cheeses. Milk, chocolate milk, and beverages made with milk, such as milk shakes. Cream. Ice cream. Sweets and Desserts Sweet rolls, doughnuts, and sweet breads. Pancakes and waffles. Fats  and Oils Butter. Cream sauces. Margarine. Salad oils. Plain salad dressings. Olives. Avocados.  Beverages Caffeinated beverages (such as coffee, tea, soda, or energy drinks). Alcoholic beverages. Fruit juices with pulp. Prune juice. Soft drinks sweetened with high-fructose corn syrup or sugar alcohols. Other Coconut. Hot sauce. Chili powder. Mayonnaise. Gravy. Cream-based or milk-based soups.  The items listed above may not be a complete list of foods and beverages to avoid. Contact your dietitian for more information. WHAT SHOULD I DO IF I BECOME DEHYDRATED? Diarrhea can sometimes lead to dehydration. Signs of dehydration include dark urine and dry mouth and skin. If you think you are dehydrated, you should rehydrate with an oral rehydration solution. These solutions can be purchased at pharmacies, retail stores, or online.  Drink -1 cup (120-240 mL) of oral rehydration solution each time you have an episode of diarrhea. If drinking this amount makes your diarrhea worse, try drinking smaller amounts more often. For example, drink 1-3 tsp (5-15 mL) every 5-10 minutes.  A general rule for staying hydrated is to drink 1-2 L of fluid per day. Talk to your health care provider about the specific amount you should be drinking each day. Drink enough fluids to keep your urine clear or pale yellow. Document Released: 09/20/2003  Document Revised: 07/05/2013 Document Reviewed: 05/23/2013 [facility] Patient Information 2015 [place], [place]. This information is not intended to replace advice given to you by your health care provider. Make sure you discuss any questions you have with your health care provider.

## 2014-10-31 NOTE — ED Provider Notes (Signed)
CSN: [id number]     Arrival date & time 10/31/14  1929 History   First MD Initiated Contact with Patient 10/31/14 2100     Chief Complaint  Patient presents with  . Abdominal Pain  . Emesis     (Consider location/radiation/quality/duration/timing/severity/associated sxs/prior Treatment) HPI Patient is a 29 year old female with past medical history IBS who presents to ER with complaint of nausea, vomiting, diarrhea along with lower abdominal discomfort. Patient states over the past 3 days she's had intermittent lower abdominal pain which she describes cramping sensation. Patient states since 3 AM yesterday morning she has had multiple episodes of nonbilious, nonbloody emesis. Patient reports a generalized, lower abdominal pain which is present only during episodes of emesis, and she also experiences some "soreness when she is urinating". She describes a feeling of incomplete emptying with urination. Patient denies fever, chest pain, shortness of breath, dizziness, weakness, headache, blurred vision, vaginal bleeding, vaginal discharge, vaginal pain.   Past Medical History  Diagnosis Date  . Irritable bowel syndrome    History reviewed. No pertinent past surgical history. No family history on file. History  Substance Use Topics  . Smoking status: Current Some Day Smoker    Types: Cigarettes  . Smokeless tobacco: Never Used  . Alcohol Use: Yes     Comment: socially   OB History    No data available     Review of Systems  Constitutional: Negative for fever.  HENT: Negative for trouble swallowing.   Eyes: Negative for visual disturbance.  Respiratory: Negative for shortness of breath.   Cardiovascular: Negative for chest pain.  Gastrointestinal: Positive for nausea, vomiting and diarrhea. Negative for abdominal pain.  Genitourinary: Negative for dysuria.  Musculoskeletal: Negative for neck pain.  Skin: Negative for rash.  Neurological: Negative for dizziness, weakness and  numbness.  Psychiatric/Behavioral: Negative.       Allergies  Review of patient's allergies indicates no known allergies.  Home Medications   Prior to Admission medications   Medication Sig Start Date End Date Taking? Authorizing Provider  ibuprofen (ADVIL,MOTRIN) 200 MG tablet Take 400 mg by mouth every 6 (six) hours as needed for moderate pain.   Yes Historical Provider, MD  loratadine (CLARITIN) 10 MG tablet Take 10 mg by mouth daily as needed for allergies.   Yes Historical Provider, MD  naproxen sodium (ANAPROX) 220 MG tablet Take 220 mg by mouth 2 (two) times daily as needed (pain).   Yes Historical Provider, MD  Norgestim-Eth Charlott HollerEstrad Triphasic (ORTHO TRI-CYCLEN LO PO) Take 1 tablet by mouth daily.   Yes Historical Provider, MD  cephALEXin (KEFLEX) 500 MG capsule Take 1 capsule (500 mg total) by mouth 3 (three) times daily. Patient not taking: Reported on 10/31/2014 06/01/14   Arthor CaptainAbigail Harris, PA-C  dicyclomine (BENTYL) 20 MG tablet Take 1 tablet (20 mg total) by mouth 2 (two) times daily. Patient not taking: Reported on 10/31/2014 06/01/14   Arthor CaptainAbigail Harris, PA-C  fluconazole (DIFLUCAN) 150 MG tablet Take 1 tablet (150 mg total) by mouth once. Patient not taking: Reported on 06/01/2014 09/03/12   Reuben Likesavid C Keller, MD  fluconazole (DIFLUCAN) 150 MG tablet Take 1 tablet (150 mg total) by mouth once. Repeat in 1 week if needed Patient not taking: Reported on 06/01/2014 03/18/13   Linna HoffJames D Kindl, MD  loperamide (IMODIUM) 2 MG capsule Take 1 capsule (2 mg total) by mouth 4 (four) times daily as needed for diarrhea or loose stools. Patient not taking: Reported on 10/31/2014 06/01/14   Cammy CopaAbigail  Harris, PA-C  metroNIDAZOLE (FLAGYL) 500 MG tablet Take 1 tablet (500 mg total) by mouth 2 (two) times daily. Patient not taking: Reported on 06/01/2014 09/07/12   Reuben Likes, MD  metroNIDAZOLE (FLAGYL) 500 MG tablet Take 1 tablet (500 mg total) by mouth 2 (two) times daily. Patient not taking: Reported on  06/01/2014 03/18/13   Linna Hoff, MD  naproxen (NAPROSYN) 500 MG tablet Take 1 tablet (500 mg total) by mouth 2 (two) times daily with a meal. Patient not taking: Reported on 10/31/2014 06/01/14   Arthor Captain, PA-C  ondansetron (ZOFRAN) 4 MG tablet Take 1 tablet (4 mg total) by mouth every 8 (eight) hours as needed for nausea or vomiting. 10/31/14   Ladona Mow, PA-C  polyethylene glycol powder (GLYCOLAX/MIRALAX) powder Take 255 g (1 Container total) by mouth once. Patient not taking: Reported on 06/01/2014 06/15/13   Lonia Skinner, MD  promethazine (PHENERGAN) 25 MG tablet Take 1 tablet (25 mg total) by mouth every 6 (six) hours as needed for nausea or vomiting. Patient not taking: Reported on 06/01/2014 06/15/13   Lonia Skinner, MD   BP 130/86 mmHg  Pulse 85  Temp(Src) 98.5 F (36.9 C) (Oral)  Resp 18  SpO2 100%  LMP 10/13/2014 (Approximate) Physical Exam  Constitutional: She is oriented to person, place, and time. She appears well-developed and well-nourished. No distress.  HENT:  Head: Normocephalic and atraumatic.  Mouth/Throat: Oropharynx is clear and moist. No oropharyngeal exudate.  Eyes: Right eye exhibits no discharge. Left eye exhibits no discharge. No scleral icterus.  Neck: Normal range of motion.  Cardiovascular: Normal rate, regular rhythm and normal heart sounds.   No murmur heard. Pulmonary/Chest: Effort normal and breath sounds normal. No respiratory distress.  Abdominal: Soft. There is no tenderness.  Musculoskeletal: Normal range of motion. She exhibits no edema or tenderness.  Neurological: She is alert and oriented to person, place, and time. No cranial nerve deficit. Coordination normal.  Skin: Skin is warm and dry. No rash noted. She is not diaphoretic.  Psychiatric: She has a normal mood and affect.  Nursing note and vitals reviewed.   ED Course  Procedures (including critical care time) Labs Review Labs Reviewed  CBC WITH DIFFERENTIAL/PLATELET -  Abnormal; Notable for the following:    WBC 12.8 (*)    Hemoglobin 15.1 (*)    Neutrophils Relative % 83 (*)    Neutro Abs 10.6 (*)    All other components within normal limits  URINALYSIS, ROUTINE W REFLEX MICROSCOPIC - Abnormal; Notable for the following:    APPearance CLOUDY (*)    Hgb urine dipstick MODERATE (*)    Ketones, ur 15 (*)    All other components within normal limits  WET PREP, GENITAL  COMPREHENSIVE METABOLIC PANEL  URINE MICROSCOPIC-ADD ON  POC URINE PREG, ED  GC/CHLAMYDIA PROBE AMP ([place])    Imaging Review No results found.   EKG Interpretation None      MDM   Final diagnoses:  Nausea vomiting and diarrhea   Patient with generalized abdominal discomfort along with suprapubic tenderness on exam. Nausea, vomiting, diarrhea for the past several days. Describing a lower left-sided abdominal "cramping" discomfort. Abdominal exam benign.   With patient's vague description of multiple complaints, recommended IV fluids and Zofran as well as pelvic exam to further evaluate lower suprapubic tenderness and cramping sensation. Patient adamant that she does not receive any IV medications or fluids, also adamant that she does not receive pelvic exam  tonight. Patient states that she has to go home and take care of her children, and does not 1 any further workup or to wait on any further results tonight. Patient's labs are unremarkable at this point, there is no evidence of urinary tract infection or and organ damage based on patient's other lab results. Mild leukocytosis, this result was discussed with patient. Patient's abdominal exam is benign on multiple examinations, there is no concern for surgical or acute abdomen.  Patient states she would much prefer to go to her OB/GYN for pelvic exam. I discussed the reasons why a pelvic exam and these treatments were recommended at this time, and notified patient there to further evaluate her abdominal discomfort. Patient again  remained adamant that she does not feel she needs further workup tonight. Patient states she understands the benefits of staying and receiving care versus the risks of leaving without further care, and she is comfortable with her decision to leave. Patient is hemodynamically stable, and will be discharged at this time. Strict return precautions are discussed with patient, encouraged to follow up with PCP. Patient verbalizes understanding and agreement of this plan. Encouraged patient to call or return to the ER with any worsening of symptoms or should she have any questions or concerns.  BP 130/86 mmHg  Pulse 85  Temp(Src) 98.5 F (36.9 C) (Oral)  Resp 18  SpO2 100%  LMP 10/13/2014 (Approximate)  Signed,  Ladona Mow, PA-C 1:19 AM  Patient discussed with Dr. Donnetta Hutching , MD  Ladona Mow, PA-C 11/01/14 1610  Donnetta Hutching, MD 11/01/14 [phone]

## 2014-10-31 NOTE — ED Notes (Signed)
MD at bedside. 

## 2014-10-31 NOTE — ED Notes (Signed)
Patient refusing pelvic exam Patient has been rude with both ED nursing and ED tech staff since arrival Patient asking for nausea medication, but will then ask for something to eat/drink Patient has not had episode of emesis that has been witnessed by nursing staff Will make EDP aware

## 2014-10-31 NOTE — ED Notes (Signed)
Patient refusing PIV placement and PIV medications Patient continues to refuse pelvic exam Patient states that she wants to go home and that she needs to take care of her children PO Zofran given and PO challenge with ginger ale started Patient remain in NAD

## 2015-01-17 ENCOUNTER — Emergency Department (INDEPENDENT_AMBULATORY_CARE_PROVIDER_SITE_OTHER)
Admission: EM | Admit: 2015-01-17 | Discharge: 2015-01-17 | Disposition: A | Discharge status: Home / Self Care | Payer: Self-pay | Source: Home / Self Care | Attending: Family Medicine | Admitting: Family Medicine

## 2015-01-17 ENCOUNTER — Other Ambulatory Visit (HOSPITAL_COMMUNITY)
Admission: RE | Admit: 2015-01-17 | Discharge: 2015-01-17 | Disposition: A | Discharge status: Home / Self Care | Payer: Self-pay | Source: Ambulatory Visit | Attending: Family Medicine | Admitting: Family Medicine

## 2015-01-17 ENCOUNTER — Encounter (HOSPITAL_COMMUNITY): Payer: Self-pay | Admitting: *Deleted

## 2015-01-17 DIAGNOSIS — N760 Acute vaginitis: Secondary | ICD-10-CM

## 2015-01-17 DIAGNOSIS — Z113 Encounter for screening for infections with a predominantly sexual mode of transmission: Secondary | ICD-10-CM | POA: Insufficient documentation

## 2015-01-17 DIAGNOSIS — K0889 Other specified disorders of teeth and supporting structures: Secondary | ICD-10-CM

## 2015-01-17 DIAGNOSIS — K088 Other specified disorders of teeth and supporting structures: Secondary | ICD-10-CM

## 2015-01-17 LAB — POCT URINALYSIS DIP (DEVICE)
BILIRUBIN URINE: NEGATIVE
Glucose, UA: NEGATIVE mg/dL
KETONES UR: NEGATIVE mg/dL
Leukocytes, UA: NEGATIVE
Nitrite: NEGATIVE
PH: 7 (ref 5.0–8.0)
Protein, ur: NEGATIVE mg/dL
Specific Gravity, Urine: 1.02 (ref 1.005–1.030)
Urobilinogen, UA: 1 mg/dL (ref 0.0–1.0)

## 2015-01-17 LAB — OB RESULTS CONSOLE GC/CHLAMYDIA: GC PROBE AMP, GENITAL: NEGATIVE

## 2015-01-17 LAB — POCT PREGNANCY, URINE: PREG TEST UR: NEGATIVE

## 2015-01-17 MED ORDER — DICLOFENAC POTASSIUM 50 MG PO TABS: 50.0000 mg | ORAL_TABLET | Freq: Three times a day (TID) | ORAL | Status: DC

## 2015-01-17 MED ORDER — METRONIDAZOLE 0.75 % VA GEL: 1.0000 | Freq: Every day | VAGINAL | Status: DC

## 2015-01-17 NOTE — ED Provider Notes (Signed)
CSN: [id number]     Arrival date & time 01/17/15  1757 History   First MD Initiated Contact with Patient 01/17/15 1945     Chief Complaint  Patient presents with  . Vaginal Discharge   (Consider location/radiation/quality/duration/timing/severity/associated sxs/prior Treatment) Patient is a 29 y.o. female presenting with vaginal discharge. The history is provided by the patient.  Vaginal Discharge Quality:  Mucopurulent and thin Severity:  Mild Onset quality:  Gradual Duration:  3 days Progression:  Unchanged Chronicity:  New Context: not recent antibiotic use   Relieved by:  None tried Worsened by:  Nothing tried Ineffective treatments:  None tried Associated symptoms: no abdominal pain, no fever, no genital lesions, no rash, no urinary frequency, no vaginal itching and no vomiting     Past Medical History  Diagnosis Date  . Irritable bowel syndrome    History reviewed. No pertinent past surgical history. History reviewed. No pertinent family history. History  Substance Use Topics  . Smoking status: Current Some Day Smoker    Types: Cigarettes  . Smokeless tobacco: Never Used  . Alcohol Use: Yes     Comment: socially   OB History    No data available     Review of Systems  Constitutional: Negative.  Negative for fever.  HENT: Positive for dental problem.   Gastrointestinal: Negative.  Negative for vomiting and abdominal pain.  Genitourinary: Positive for vaginal discharge.    Allergies  Review of patient's allergies indicates no known allergies.  Home Medications   Prior to Admission medications   Medication Sig Start Date End Date Taking? Authorizing Provider  cephALEXin (KEFLEX) 500 MG capsule Take 1 capsule (500 mg total) by mouth 3 (three) times daily. Patient not taking: Reported on 10/31/2014 06/01/14   Arthor Captain, PA-C  diclofenac (CATAFLAM) 50 MG tablet Take 1 tablet (50 mg total) by mouth 3 (three) times daily. 01/17/15   Linna Hoff, MD    dicyclomine (BENTYL) 20 MG tablet Take 1 tablet (20 mg total) by mouth 2 (two) times daily. Patient not taking: Reported on 10/31/2014 06/01/14   Arthor Captain, PA-C  fluconazole (DIFLUCAN) 150 MG tablet Take 1 tablet (150 mg total) by mouth once. Patient not taking: Reported on 06/01/2014 09/03/12   Reuben Likes, MD  fluconazole (DIFLUCAN) 150 MG tablet Take 1 tablet (150 mg total) by mouth once. Repeat in 1 week if needed Patient not taking: Reported on 06/01/2014 03/18/13   Linna Hoff, MD  ibuprofen (ADVIL,MOTRIN) 200 MG tablet Take 400 mg by mouth every 6 (six) hours as needed for moderate pain.    Historical Provider, MD  loperamide (IMODIUM) 2 MG capsule Take 1 capsule (2 mg total) by mouth 4 (four) times daily as needed for diarrhea or loose stools. Patient not taking: Reported on 10/31/2014 06/01/14   Arthor Captain, PA-C  loratadine (CLARITIN) 10 MG tablet Take 10 mg by mouth daily as needed for allergies.    Historical Provider, MD  metroNIDAZOLE (FLAGYL) 500 MG tablet Take 1 tablet (500 mg total) by mouth 2 (two) times daily. Patient not taking: Reported on 06/01/2014 09/07/12   Reuben Likes, MD  metroNIDAZOLE (FLAGYL) 500 MG tablet Take 1 tablet (500 mg total) by mouth 2 (two) times daily. Patient not taking: Reported on 06/01/2014 03/18/13   Linna Hoff, MD  metroNIDAZOLE (METROGEL VAGINAL) 0.75 % vaginal gel Place 1 Applicatorful vaginally at bedtime. For 5 nights 01/17/15   Linna Hoff, MD  naproxen (NAPROSYN) 500 MG  tablet Take 1 tablet (500 mg total) by mouth 2 (two) times daily with a meal. Patient not taking: Reported on 10/31/2014 06/01/14   Arthor CaptainAbigail Harris, PA-C  naproxen sodium (ANAPROX) 220 MG tablet Take 220 mg by mouth 2 (two) times daily as needed (pain).    Historical Provider, MD  Norgestim-Eth Charlott HollerEstrad Triphasic (ORTHO TRI-CYCLEN LO PO) Take 1 tablet by mouth daily.    Historical Provider, MD  ondansetron (ZOFRAN) 4 MG tablet Take 1 tablet (4 mg total) by mouth every 8  (eight) hours as needed for nausea or vomiting. 10/31/14   Ladona MowJoe Mintz, PA-C  polyethylene glycol powder (GLYCOLAX/MIRALAX) powder Take 255 g (1 Container total) by mouth once. Patient not taking: Reported on 06/01/2014 06/15/13   Lonia SkinnerStephanie E Losq, MD  promethazine (PHENERGAN) 25 MG tablet Take 1 tablet (25 mg total) by mouth every 6 (six) hours as needed for nausea or vomiting. Patient not taking: Reported on 06/01/2014 06/15/13   Lonia SkinnerStephanie E Losq, MD   BP 118/76 mmHg  Pulse 72  Temp(Src) 98.6 F (37 C) (Oral)  Resp 16  SpO2 100%  LMP 01/08/2015 Physical Exam  Constitutional: She is oriented to person, place, and time. She appears well-developed and well-nourished.  HENT:  Mouth/Throat: Oropharynx is clear and moist and mucous membranes are normal. Abnormal dentition.    Neck: Normal range of motion. Neck supple.  Abdominal: Soft. Bowel sounds are normal.  Genitourinary: Vagina normal.  Lymphadenopathy:    She has no cervical adenopathy.  Neurological: She is alert and oriented to person, place, and time.  Skin: Skin is warm and dry.  Nursing note and vitals reviewed.   ED Course  Procedures (including critical care time) Labs Review Labs Reviewed  POCT URINALYSIS DIP (DEVICE) - Abnormal; Notable for the following:    Hgb urine dipstick MODERATE (*)    All other components within normal limits  POCT PREGNANCY, URINE  CERVICOVAGINAL ANCILLARY ONLY    Imaging Review No results found.   MDM   1. Vaginitis   2. Pain, dental        Linna HoffJames D Sham Alviar, MD 01/17/15 2017

## 2015-01-17 NOTE — ED Notes (Signed)
Pt  Reports  Symptoms  Of  Vaginal  Discharge  For  About  3  Days    Not  releived  By  Diflucan         With  Some  Irritation  There  As  Well    Pt  Also  Reports  A  Toothache

## 2015-01-17 NOTE — Discharge Instructions (Signed)
Take medicine as prescribed, see your dentist as soon as possible. We will call with positive test results and treat as indicated

## 2015-01-18 LAB — CERVICOVAGINAL ANCILLARY ONLY
Chlamydia: NEGATIVE
Neisseria Gonorrhea: NEGATIVE
Wet Prep (BD Affirm): NEGATIVE

## 2015-01-18 NOTE — ED Notes (Signed)
Final report of STD screening negative. Patient called to check on status of reports. After verifying ID, disscused reports

## 2015-02-19 ENCOUNTER — Encounter (HOSPITAL_COMMUNITY): Payer: Self-pay | Admitting: *Deleted

## 2015-02-19 ENCOUNTER — Inpatient Hospital Stay (HOSPITAL_COMMUNITY)
Admission: AD | Admit: 2015-02-19 | Discharge: 2015-02-19 | Disposition: A | Discharge status: Home / Self Care | Payer: Self-pay | Source: Ambulatory Visit | Attending: Family Medicine | Admitting: Family Medicine

## 2015-02-19 DIAGNOSIS — O9989 Other specified diseases and conditions complicating pregnancy, childbirth and the puerperium: Secondary | ICD-10-CM | POA: Insufficient documentation

## 2015-02-19 DIAGNOSIS — O99611 Diseases of the digestive system complicating pregnancy, first trimester: Secondary | ICD-10-CM

## 2015-02-19 DIAGNOSIS — M25552 Pain in left hip: Secondary | ICD-10-CM | POA: Insufficient documentation

## 2015-02-19 DIAGNOSIS — IMO0001 Reserved for inherently not codable concepts without codable children: Secondary | ICD-10-CM

## 2015-02-19 DIAGNOSIS — K589 Irritable bowel syndrome without diarrhea: Secondary | ICD-10-CM

## 2015-02-19 DIAGNOSIS — Z3A01 Less than 8 weeks gestation of pregnancy: Secondary | ICD-10-CM

## 2015-02-19 DIAGNOSIS — F17210 Nicotine dependence, cigarettes, uncomplicated: Secondary | ICD-10-CM | POA: Insufficient documentation

## 2015-02-19 DIAGNOSIS — K5900 Constipation, unspecified: Secondary | ICD-10-CM

## 2015-02-19 DIAGNOSIS — O99331 Smoking (tobacco) complicating pregnancy, first trimester: Secondary | ICD-10-CM | POA: Insufficient documentation

## 2015-02-19 LAB — URINALYSIS, ROUTINE W REFLEX MICROSCOPIC
Bilirubin Urine: NEGATIVE
GLUCOSE, UA: NEGATIVE mg/dL
KETONES UR: 15 mg/dL — AB
NITRITE: NEGATIVE
PH: 6 (ref 5.0–8.0)
Protein, ur: NEGATIVE mg/dL
Specific Gravity, Urine: 1.025 (ref 1.005–1.030)
Urobilinogen, UA: 1 mg/dL (ref 0.0–1.0)

## 2015-02-19 LAB — URINE MICROSCOPIC-ADD ON

## 2015-02-19 LAB — POCT PREGNANCY, URINE: Preg Test, Ur: POSITIVE — AB

## 2015-02-19 MED ORDER — DOCUSATE SODIUM 250 MG PO CAPS: 250.0000 mg | ORAL_CAPSULE | Freq: Two times a day (BID) | ORAL | Status: DC

## 2015-02-19 NOTE — MAU Provider Note (Signed)
History   [id number] at apx [redacted] wks gestation in with c/o left sided pain that has been ongoing for a while. She states pain goes from left ribs to l hip. Pt stes she has hx of IBS and her bowels have not moved in several days.  CSN: [id number]  Arrival date and time: 02/19/15 1206   None     Chief Complaint  Patient presents with  . Abdominal Pain  . Urinary Tract Infection   HPI  OB History    Gravida Para Term Preterm AB TAB SAB Ectopic Multiple Living   Past Medical History  Diagnosis Date  . Irritable bowel syndrome     Past Surgical History  Procedure Laterality Date  . Dilation and curettage of uterus      History reviewed. No pertinent family history.  History  Substance Use Topics  . Smoking status: Current Some Day Smoker    Types: Cigarettes  . Smokeless tobacco: Never Used  . Alcohol Use: Yes     Comment: socially    Allergies: No Known Allergies  Prescriptions prior to admission  Medication Sig Dispense Refill Last Dose  . cephALEXin (KEFLEX) 500 MG capsule Take 1 capsule (500 mg total) by mouth 3 (three) times daily. (Patient not taking: Reported on 10/31/2014) 21 capsule 0 Completed Course at Unknown time  . diclofenac (CATAFLAM) 50 MG tablet Take 1 tablet (50 mg total) by mouth 3 (three) times daily. 15 tablet 0   . dicyclomine (BENTYL) 20 MG tablet Take 1 tablet (20 mg total) by mouth 2 (two) times daily. (Patient not taking: Reported on 10/31/2014) 20 tablet 0 Completed Course at Unknown time  . fluconazole (DIFLUCAN) 150 MG tablet Take 1 tablet (150 mg total) by mouth once. (Patient not taking: Reported on 06/01/2014) 3 tablet 0 Completed Course at Unknown time  . fluconazole (DIFLUCAN) 150 MG tablet Take 1 tablet (150 mg total) by mouth once. Repeat in 1 week if needed (Patient not taking: Reported on 06/01/2014) 1 tablet 1 Completed Course at Unknown time  . ibuprofen (ADVIL,MOTRIN) 200 MG tablet Take 400 mg by mouth every 6  (six) hours as needed for moderate pain.   Past Week at Unknown time  . loperamide (IMODIUM) 2 MG capsule Take 1 capsule (2 mg total) by mouth 4 (four) times daily as needed for diarrhea or loose stools. (Patient not taking: Reported on 10/31/2014) 12 capsule 0 Completed Course at Unknown time  . loratadine (CLARITIN) 10 MG tablet Take 10 mg by mouth daily as needed for allergies.   10/30/2014 at Unknown time  . metroNIDAZOLE (FLAGYL) 500 MG tablet Take 1 tablet (500 mg total) by mouth 2 (two) times daily. (Patient not taking: Reported on 06/01/2014) 14 tablet 0 Completed Course at Unknown time  . metroNIDAZOLE (FLAGYL) 500 MG tablet Take 1 tablet (500 mg total) by mouth 2 (two) times daily. (Patient not taking: Reported on 06/01/2014) 14 tablet 0 Completed Course at Unknown time  . metroNIDAZOLE (METROGEL VAGINAL) 0.75 % vaginal gel Place 1 Applicatorful vaginally at bedtime. For 5 nights 70 g 0   . naproxen (NAPROSYN) 500 MG tablet Take 1 tablet (500 mg total) by mouth 2 (two) times daily with a meal. (Patient not taking: Reported on 10/31/2014) 30 tablet 0 Completed Course at Unknown time  . naproxen sodium (ANAPROX) 220 MG tablet Take 220 mg by mouth 2 (two) times daily as  needed (pain).   Past Week at Unknown time  . Norgestim-Eth Estrad Triphasic (ORTHO TRI-CYCLEN LO PO) Take 1 tablet by mouth daily.   10/31/2014 at Unknown time  . ondansetron (ZOFRAN) 4 MG tablet Take 1 tablet (4 mg total) by mouth every 8 (eight) hours as needed for nausea or vomiting. 12 tablet 0   . polyethylene glycol powder (GLYCOLAX/MIRALAX) powder Take 255 g (1 Container total) by mouth once. (Patient not taking: Reported on 06/01/2014) 527 g 2 Not Taking at Unknown time  . promethazine (PHENERGAN) 25 MG tablet Take 1 tablet (25 mg total) by mouth every 6 (six) hours as needed for nausea or vomiting. (Patient not taking: Reported on 06/01/2014) 30 tablet 0 Completed Course at Unknown time    Review of Systems   Constitutional: Negative.   HENT: Negative.   Eyes: Negative.   Respiratory: Negative.   Cardiovascular: Negative.   Gastrointestinal: Positive for abdominal pain and constipation.  Genitourinary: Negative.   Musculoskeletal: Negative.   Skin: Negative.   Neurological: Negative.   Endo/Heme/Allergies: Negative.   Psychiatric/Behavioral: Negative.    Physical Exam   Blood pressure 116/67, pulse 89, temperature 98.3 F (36.8 C), temperature source Oral, resp. rate 16, height 5' 6.5" (1.689 m), weight 158 lb (71.668 kg), last menstrual period 01/05/2015.  Physical Exam  Constitutional: She is oriented to person, place, and time. She appears well-developed and well-nourished.  HENT:  Head: Normocephalic.  Eyes: Pupils are equal, round, and reactive to light.  Neck: Normal range of motion. Neck supple.  Cardiovascular: Normal rate, regular rhythm, normal heart sounds and intact distal pulses.   Respiratory: Effort normal and breath sounds normal.  GI: Soft. Bowel sounds are normal.  Genitourinary: Vagina normal and uterus normal.  Musculoskeletal: Normal range of motion.  Neurological: She is alert and oriented to person, place, and time. She has normal reflexes.  Skin: Skin is warm and dry.  Psychiatric: She has a normal mood and affect. Her behavior is normal. Judgment and thought content normal.    MAU Course  Procedures  MDM Constipation and IBS   Assessment and Plan  IBS and  Constipation. Will start colace and discussed high fiber diet and increase fluids po. Also out pt u/s for dates.  Amy Davies 02/19/2015, 1:27 PM

## 2015-02-19 NOTE — MAU Note (Signed)
Patient presents stating that she is pregnant and c/o left sided abdominal pain and a UTI X 2 days. Denies bleeding but has a clear discharge.

## 2015-02-19 NOTE — Discharge Instructions (Signed)
Diet and Irritable Bowel Syndrome  No cure has been found for irritable bowel syndrome (IBS). Many options are available to treat the symptoms. Your caregiver will give you the best treatments available for your symptoms. He or she will also encourage you to manage stress and to make changes to your diet. You need to work with your caregiver and Registered Dietician to find the best combination of medicine, diet, counseling, and support to control your symptoms. The following are some diet suggestions. FOODS THAT MAKE IBS WORSE  Fatty foods, such as French fries.  Milk products, such as cheese or ice cream.  Chocolate.  Alcohol.  Caffeine (found in coffee and some sodas).  Carbonated drinks, such as soda. If certain foods cause symptoms, you should eat less of them or stop eating them. FOOD JOURNAL   Keep a journal of the foods that seem to cause distress. Write down:  What you are eating during the day and when.  What problems you are having after eating.  When the symptoms occur in relation to your meals.  What foods always make you feel badly.  Take your notes with you to your caregiver to see if you should stop eating certain foods. FOODS THAT MAKE IBS BETTER Fiber reduces IBS symptoms, especially constipation, because it makes stools soft, bulky, and easier to pass. Fiber is found in bran, bread, cereal, beans, fruit, and vegetables. Examples of foods with fiber include:  Apples.  Peaches.  Pears.  Berries.  Figs.  Broccoli, raw.  Cabbage.  Carrots.  Raw peas.  Kidney beans.  Lima beans.  Whole-grain bread.  Whole-grain cereal. Add foods with fiber to your diet a little at a time. This will let your body get used to them. Too much fiber at once might cause gas and swelling of your abdomen. This can trigger symptoms in a person with IBS. Caregivers usually recommend a diet with enough fiber to produce soft, painless bowel movements. High fiber diets may  cause gas and bloating. However, these symptoms often go away within a few weeks, as your body adjusts. In many cases, dietary fiber may lessen IBS symptoms, particularly constipation. However, it may not help pain or diarrhea. High fiber diets keep the colon mildly enlarged (distended) with the added fiber. This may help prevent spasms in the colon. Some forms of fiber also keep water in the stool, thereby preventing hard stools that are difficult to pass.  Besides telling you to eat more foods with fiber, your caregiver may also tell you to get more fiber by taking a fiber pill or drinking water mixed with a special high fiber powder. An example of this is a natural fiber laxative containing psyllium seed.  TIPS  Large meals can cause cramping and diarrhea in people with IBS. If this happens to you, try eating 4 or 5 small meals a day, or try eating less at each of your usual 3 meals. It may also help if your meals are low in fat and high in carbohydrates. Examples of carbohydrates are pasta, rice, whole-grain breads and cereals, fruits, and vegetables.  If dairy products cause your symptoms to flare up, you can try eating less of those foods. You might be able to handle yogurt better than other dairy products, because it contains bacteria that helps with digestion. Dairy products are an important source of calcium and other nutrients. If you need to avoid dairy products, be sure to talk with a Registered Dietitian about getting these nutrients   through other food sources.  Drink enough water and fluids to keep your urine clear or pale yellow. This is important, especially if you have diarrhea. FOR MORE INFORMATION  International Foundation for Functional Gastrointestinal Disorders: www.iffgd.org  National Digestive Diseases Information Clearinghouse: digestive.niddk.nih.gov Document Released: 09/20/2003 Document Revised: 09/22/2011 Document Reviewed: 09/30/2013 ExitCare Patient Information 2015  [employer]. This information is not intended to replace advice given to you by your health care provider. Make sure you discuss any questions you have with your health care provider.  

## 2015-02-28 ENCOUNTER — Other Ambulatory Visit (HOSPITAL_COMMUNITY): Payer: Self-pay | Admitting: Obstetrics and Gynecology

## 2015-02-28 ENCOUNTER — Inpatient Hospital Stay (HOSPITAL_COMMUNITY)
Admission: AD | Admit: 2015-02-28 | Discharge: 2015-02-28 | Disposition: A | Discharge status: Home / Self Care | Payer: Self-pay | Source: Ambulatory Visit | Attending: Family Medicine | Admitting: Family Medicine

## 2015-02-28 ENCOUNTER — Encounter (HOSPITAL_COMMUNITY): Payer: Self-pay

## 2015-02-28 ENCOUNTER — Ambulatory Visit (HOSPITAL_COMMUNITY)
Admission: RE | Admit: 2015-02-28 | Discharge: 2015-02-28 | Disposition: A | Discharge status: Home / Self Care | Payer: Self-pay | Source: Ambulatory Visit | Attending: Obstetrics and Gynecology | Admitting: Obstetrics and Gynecology

## 2015-02-28 DIAGNOSIS — IMO0001 Reserved for inherently not codable concepts without codable children: Secondary | ICD-10-CM

## 2015-02-28 DIAGNOSIS — Z36 Encounter for antenatal screening of mother: Secondary | ICD-10-CM | POA: Insufficient documentation

## 2015-02-28 DIAGNOSIS — O3481 Maternal care for other abnormalities of pelvic organs, first trimester: Secondary | ICD-10-CM | POA: Insufficient documentation

## 2015-02-28 DIAGNOSIS — N8320 Unspecified ovarian cysts: Secondary | ICD-10-CM | POA: Insufficient documentation

## 2015-02-28 DIAGNOSIS — Z3491 Encounter for supervision of normal pregnancy, unspecified, first trimester: Secondary | ICD-10-CM

## 2015-02-28 DIAGNOSIS — Z3A01 Less than 8 weeks gestation of pregnancy: Secondary | ICD-10-CM | POA: Insufficient documentation

## 2015-02-28 NOTE — MAU Provider Note (Signed)
  History     CSN: [id number]  Arrival date and time: 02/28/15 0919   None     No chief complaint on file.  HPI Amy Davies 29 y.o. No obstetric history on file.  presents for u/s results.  She was previously seen and advised to return today for u/s.  She reports no vaginal bleeding, abdominal pain or any other symptoms.   OB History    No data available      No past medical history on file.  No past surgical history on file.  No family history on file.  Social History  Substance Use Topics  . Smoking status: Not on file  . Smokeless tobacco: Not on file  . Alcohol Use: Not on file    Allergies: Not on File  No prescriptions prior to admission    ROS Pertinent ROS in HPI.  All other systems are negative.   Physical Exam   There were no vitals taken for this visit.  Physical Exam  Constitutional: She is oriented to person, place, and time. She appears well-developed and well-nourished. No distress.  HENT:  Head: Normocephalic and atraumatic.  Eyes: EOM are normal.  Neck: Normal range of motion.  Respiratory: No respiratory distress.  Musculoskeletal: Normal range of motion.  Neurological: She is alert and oriented to person, place, and time.  Psychiatric: She has a normal mood and affect.    MAU Course  Procedures  MDM U/S report given CRL consistent with 6weeks 6 days and EDC of 10/18/15.  There is good fetal cardiac activity.  Assessment and Plan  A: viable IUP  P: DIscharge to home PNV qd Obtain [facility] asap Photo provided to pt Patient may return to MAU as needed or if her condition were to change or worsen   Bertram Denver 02/28/2015, 10:05 AM

## 2015-03-02 ENCOUNTER — Ambulatory Visit (HOSPITAL_COMMUNITY): Payer: Self-pay

## 2015-03-09 ENCOUNTER — Encounter (HOSPITAL_COMMUNITY): Payer: Self-pay | Admitting: *Deleted

## 2015-03-09 ENCOUNTER — Inpatient Hospital Stay (HOSPITAL_COMMUNITY)
Admission: AD | Admit: 2015-03-09 | Discharge: 2015-03-09 | Disposition: A | Discharge status: Home / Self Care | Payer: Self-pay | Source: Ambulatory Visit | Attending: Obstetrics and Gynecology | Admitting: Obstetrics and Gynecology

## 2015-03-09 DIAGNOSIS — K5900 Constipation, unspecified: Secondary | ICD-10-CM | POA: Insufficient documentation

## 2015-03-09 DIAGNOSIS — Z87891 Personal history of nicotine dependence: Secondary | ICD-10-CM | POA: Insufficient documentation

## 2015-03-09 DIAGNOSIS — O99611 Diseases of the digestive system complicating pregnancy, first trimester: Secondary | ICD-10-CM | POA: Insufficient documentation

## 2015-03-09 DIAGNOSIS — Z3A09 9 weeks gestation of pregnancy: Secondary | ICD-10-CM | POA: Insufficient documentation

## 2015-03-09 LAB — URINALYSIS, ROUTINE W REFLEX MICROSCOPIC
Bilirubin Urine: NEGATIVE
Glucose, UA: NEGATIVE mg/dL
Hgb urine dipstick: NEGATIVE
Ketones, ur: NEGATIVE mg/dL
Leukocytes, UA: NEGATIVE
Nitrite: NEGATIVE
Protein, ur: NEGATIVE mg/dL
Specific Gravity, Urine: 1.02 (ref 1.005–1.030)
Urobilinogen, UA: 1 mg/dL (ref 0.0–1.0)
pH: 7.5 (ref 5.0–8.0)

## 2015-03-09 LAB — CBC
HCT: 38.3 % (ref 36.0–46.0)
Hemoglobin: 12.8 g/dL (ref 12.0–15.0)
MCH: 31.4 pg (ref 26.0–34.0)
MCHC: 33.4 g/dL (ref 30.0–36.0)
MCV: 94.1 fL (ref 78.0–100.0)
Platelets: 249 K/uL (ref 150–400)
RBC: 4.07 MIL/uL (ref 3.87–5.11)
RDW: 12.9 % (ref 11.5–15.5)
WBC: 9.5 K/uL (ref 4.0–10.5)

## 2015-03-09 NOTE — MAU Provider Note (Signed)
Chief Complaint: No chief complaint on file.   First Provider Initiated Contact with Patient 03/09/15 1142      SUBJECTIVE HPI: Amy Davies is a 29 y.o. [id number] at [redacted]w[redacted]d by LMP who presents to maternity admissions reporting she was sent from [facility] office related to high iron levels.  She reports she was told her iron was 15 and she has chronic constipation r/t IBS so she should be seen by her OB/Gyn as soon as possible. She is waiting for her insurance and unable to make an appointment so she came in to MAU. She denies abdominal pain, vaginal bleeding, vaginal itching/burning, urinary symptoms, h/a, dizziness, n/v, chest pain, or fever/chills.  She was a former smoker but quit prior to pregnancy.   HPI  Past Medical History  Diagnosis Date  . Irritable bowel syndrome    Past Surgical History  Procedure Laterality Date  . Induced abortion     Social History   Social History  . Marital Status: Single    Spouse Name: N/A  . Number of Children: N/A  . Years of Education: N/A   Occupational History  . Not on file.   Social History Main Topics  . Smoking status: Former Smoker    Types: Cigarettes  . Smokeless tobacco: Never Used  . Alcohol Use: Yes     Comment: socially; not since +preg  . Drug Use: Yes    Special: Marijuana     Comment: last was Aug 07/2014  . Sexual Activity: Yes    Birth Control/ Protection: Injection   Other Topics Concern  . Not on file   Social History Narrative   No current facility-administered medications on file prior to encounter.   Current Outpatient Prescriptions on File Prior to Encounter  Medication Sig Dispense Refill  . acetaminophen (TYLENOL) 325 MG tablet Take 650 mg by mouth every 6 (six) hours as needed for headache.    . docusate sodium (COLACE) 250 MG capsule Take 1 capsule (250 mg total) by mouth 2 (two) times daily. 60 capsule 11   No Known Allergies  ROS:  Review of Systems  Constitutional: Negative for fever, chills and  fatigue.  HENT: Negative for sinus pressure.   Eyes: Negative for photophobia.  Respiratory: Negative for shortness of breath.   Cardiovascular: Negative for chest pain.  Gastrointestinal: Negative for nausea, vomiting, diarrhea and constipation.  Genitourinary: Negative for dysuria, frequency, flank pain, vaginal bleeding, vaginal discharge, difficulty urinating, vaginal pain and pelvic pain.  Musculoskeletal: Negative for neck pain.  Neurological: Negative for dizziness, weakness and headaches.  Psychiatric/Behavioral: Negative.      I have reviewed patient's Past Medical Hx, Surgical Hx, Family Hx, Social Hx, medications and allergies.   Physical Exam   Patient Vitals for the past 24 hrs:  BP Temp Temp src Pulse Resp  03/09/15 1237 111/65 mmHg - - 78 16  03/09/15 1123 113/67 mmHg 98.3 F (36.8 C) Oral 83 16   Constitutional: Well-developed, well-nourished female in no acute distress.  Cardiovascular: normal rate Respiratory: normal effort GI: Abd soft, non-tender. Pos BS x 4 MS: Extremities nontender, no edema, normal ROM Neurologic: Alert and oriented x 4.  GU: Neg CVAT.  PELVIC EXAM: Cervix pink, visually closed, without lesion, scant white creamy discharge, vaginal walls and external genitalia normal Bimanual exam: Cervix 0/long/high, firm, anterior, neg CMT, uterus nontender, nonenlarged, adnexa without tenderness, enlargement, or mass   LAB RESULTS Results for orders placed or performed during the hospital encounter of 03/09/15 (  from the past 24 hour(s))  Urinalysis, Routine w reflex microscopic (not at [facility])     Status: None   Collection Time: 03/09/15 11:30 AM  Result Value Ref Range   Color, Urine YELLOW YELLOW   APPearance CLEAR CLEAR   Specific Gravity, Urine 1.020 1.005 - 1.030   pH 7.5 5.0 - 8.0   Glucose, UA NEGATIVE NEGATIVE mg/dL   Hgb urine dipstick NEGATIVE NEGATIVE   Bilirubin Urine NEGATIVE NEGATIVE   Ketones, ur NEGATIVE NEGATIVE mg/dL   Protein,  ur NEGATIVE NEGATIVE mg/dL   Urobilinogen, UA 1.0 0.0 - 1.0 mg/dL   Nitrite NEGATIVE NEGATIVE   Leukocytes, UA NEGATIVE NEGATIVE  CBC     Status: None   Collection Time: 03/09/15 11:55 AM  Result Value Ref Range   WBC 9.5 4.0 - 10.5 K/uL   RBC 4.07 3.87 - 5.11 MIL/uL   Hemoglobin 12.8 12.0 - 15.0 g/dL   HCT 82.9 56.2 - 13.0 %   MCV 94.1 78.0 - 100.0 fL   MCH 31.4 26.0 - 34.0 pg   MCHC 33.4 30.0 - 36.0 g/dL   RDW 86.5 78.4 - 69.6 %   Platelets 249 150 - 400 K/uL     MAU Management/MDM: Ordered labs and reviewed results.  Reassurance provided to pt about normal labs.  Pt stable at time of discharge.  ASSESSMENT 1. Constipation during pregnancy in first trimester     PLAN Discharge home Teaching about constipation, including high fiber diet done Pt to start prenatal care as soon as possible    Medication List    TAKE these medications        acetaminophen 325 MG tablet  Commonly known as:  TYLENOL  Take 650 mg by mouth every 6 (six) hours as needed for headache.     docusate sodium 250 MG capsule  Commonly known as:  COLACE  Take 1 capsule (250 mg total) by mouth 2 (two) times daily.       Follow-up Information    Please follow up.   Why:  Start prenatal care as soon as possible      Follow up with THE [facility] [place] [facility].   Why:  As needed for emergencies   Contact information:   [address] [id number] mc [place] [address] [phone]      Sharen Counter Certified Nurse-Midwife 03/09/2015  2:20 PM

## 2015-03-09 NOTE — Discharge Instructions (Signed)
Try Metamucil daily to prevent constipation.  Also try yogurt daily, as this may help improve digestion.  Continue to drink at least 8 glasses of water/day.  Constipation Constipation is when a person has fewer than three bowel movements a week, has difficulty having a bowel movement, or has stools that are dry, hard, or larger than normal. As people grow older, constipation is more common. If you try to fix constipation with medicines that make you have a bowel movement (laxatives), the problem may get worse. Long-term laxative use may cause the muscles of the colon to become weak. A low-fiber diet, not taking in enough fluids, and taking certain medicines may make constipation worse.  CAUSES   Certain medicines, such as antidepressants, pain medicine, iron supplements, antacids, and water pills.   Certain diseases, such as diabetes, irritable bowel syndrome (IBS), thyroid disease, or depression.   Not drinking enough water.   Not eating enough fiber-rich foods.   Stress or travel.   Lack of physical activity or exercise.   Ignoring the urge to have a bowel movement.   Using laxatives too much.  SIGNS AND SYMPTOMS   Having fewer than three bowel movements a week.   Straining to have a bowel movement.   Having stools that are hard, dry, or larger than normal.   Feeling full or bloated.   Pain in the lower abdomen.   Not feeling relief after having a bowel movement.  DIAGNOSIS  Your health care provider will take a medical history and perform a physical exam. Further testing may be done for severe constipation. Some tests may include:  A barium enema X-ray to examine your rectum, colon, and, sometimes, your small intestine.   A sigmoidoscopy to examine your lower colon.   A colonoscopy to examine your entire colon. TREATMENT  Treatment will depend on the severity of your constipation and what is causing it. Some dietary treatments include drinking more  fluids and eating more fiber-rich foods. Lifestyle treatments may include regular exercise. If these diet and lifestyle recommendations do not help, your health care provider may recommend taking over-the-counter laxative medicines to help you have bowel movements. Prescription medicines may be prescribed if over-the-counter medicines do not work.  HOME CARE INSTRUCTIONS   Eat foods that have a lot of fiber, such as fruits, vegetables, whole grains, and beans.  Limit foods high in fat and processed sugars, such as french fries, hamburgers, cookies, candies, and soda.   A fiber supplement may be added to your diet if you cannot get enough fiber from foods.   Drink enough fluids to keep your urine clear or pale yellow.   Exercise regularly or as directed by your health care provider.   Go to the restroom when you have the urge to go. Do not hold it.   Only take over-the-counter or prescription medicines as directed by your health care provider. Do not take other medicines for constipation without talking to your health care provider first.  SEEK IMMEDIATE MEDICAL CARE IF:   You have bright red blood in your stool.   Your constipation lasts for more than 4 days or gets worse.   You have abdominal or rectal pain.   You have thin, pencil-like stools.   You have unexplained weight loss. MAKE SURE YOU:   Understand these instructions.  Will watch your condition.  Will get help right away if you are not doing well or get worse. Document Released: 03/28/2004 Document Revised: 07/05/2013 Document Reviewed: 04/11/2013  ExitCare Patient Information 2015 [place], [place]. This information is not intended to replace advice given to you by your health care provider. Make sure you discuss any questions you have with your health care provider.  High-Fiber Diet Fiber is found in fruits, vegetables, and grains. A high-fiber diet encourages the addition of more whole grains, legumes,  fruits, and vegetables in your diet. The recommended amount of fiber for adult males is 38 g per day. For adult females, it is 25 g per day. Pregnant and lactating women should get 28 g of fiber per day. If you have a digestive or bowel problem, ask your caregiver for advice before adding high-fiber foods to your diet. Eat a variety of high-fiber foods instead of only a select few type of foods.  PURPOSE  To increase stool bulk.  To make bowel movements more regular to prevent constipation.  To lower cholesterol.  To prevent overeating. WHEN IS THIS DIET USED?  It may be used if you have constipation and hemorrhoids.  It may be used if you have uncomplicated diverticulosis (intestine condition) and irritable bowel syndrome.  It may be used if you need help with weight management.  It may be used if you want to add it to your diet as a protective measure against atherosclerosis, diabetes, and cancer. SOURCES OF FIBER  Whole-grain breads and cereals.  Fruits, such as apples, oranges, bananas, berries, prunes, and pears.  Vegetables, such as green peas, carrots, sweet potatoes, beets, broccoli, cabbage, spinach, and artichokes.  Legumes, such split peas, soy, lentils.  Almonds. FIBER CONTENT IN FOODS Starches and Grains / Dietary Fiber (g)  Raisin Bran, 1 cup/7g  Cheerios, 1 cup / 3 g  Corn Flakes cereal, 1 cup / 0.7 g  Rice crispy treat cereal, 1 cup / 0.3 g  Instant oatmeal (cooked),  cup / 2 g  Frosted wheat cereal, 1 cup / 5.1 g  Brown, long-grain rice (cooked), 1 cup / 3.5 g  White, long-grain rice (cooked), 1 cup / 0.6 g  Enriched macaroni (cooked), 1 cup / 2.5 g Legumes / Dietary Fiber (g)  Baked beans (canned, plain, or vegetarian),  cup / 5.2 g  Kidney beans (canned),  cup / 6.8 g  Pinto beans (cooked),  cup / 5.5 g Breads and Crackers / Dietary Fiber (g)  Plain or honey graham crackers, 2 squares / 0.7 g  Saltine crackers, 3 squares / 0.3  g  Plain, salted pretzels, 10 pieces / 1.8 g  Whole-wheat bread, 1 slice / 1.9 g  White bread, 1 slice / 0.7 g  Raisin bread, 1 slice / 1.2 g  Plain bagel, 3 oz / 2 g  Flour tortilla, 1 oz / 0.9 g  Corn tortilla, 1 small / 1.5 g  Hamburger or hotdog bun, 1 small / 0.9 g Fruits / Dietary Fiber (g)  Apple with skin, 1 medium / 4.4 g  Sweetened applesauce,  cup / 1.5 g  Banana,  medium / 1.5 g  Grapes, 10 grapes / 0.4 g  Orange, 1 small / 2.3 g  Raisin, 1.5 oz / 1.6 g  Melon, 1 cup / 1.4 g Vegetables / Dietary Fiber (g)  Green beans (canned),  cup / 1.3 g  Carrots (cooked),  cup / 2.3 g  Broccoli (cooked),  cup / 2.8 g  Peas (cooked),  cup / 4.4 g  Mashed potatoes,  cup / 1.6 g  Lettuce, 1 cup / 0.5 g  Corn (canned),  cup / 1.6 g  Tomato,  cup / 1.1 g Document Released: 06/30/2005 Document Revised: 12/30/2011 Document Reviewed: 10/02/2011 [facility] Patient Information 2015 [place], [place]. This information is not intended to replace advice given to you by your health care provider. Make sure you discuss any questions you have with your health care provider.

## 2015-03-09 NOTE — MAU Note (Signed)
Last time she was here. She was constipated, has hx of IBS. Did 2 enemas and was eating prunes. Finally had a bm yesterday, had not had one since 08/12.  Was at [facility] today, was told her iron level was high  And she needed to be seen.

## 2015-03-22 ENCOUNTER — Ambulatory Visit (INDEPENDENT_AMBULATORY_CARE_PROVIDER_SITE_OTHER): Payer: Medicaid Other | Admitting: Certified Nurse Midwife

## 2015-03-22 ENCOUNTER — Encounter: Payer: Self-pay | Admitting: Certified Nurse Midwife

## 2015-03-22 VITALS — BP 105/66 | HR 78 | Temp 96.9°F | Wt 153.0 lb

## 2015-03-22 DIAGNOSIS — G44211 Episodic tension-type headache, intractable: Secondary | ICD-10-CM

## 2015-03-22 DIAGNOSIS — Z3480 Encounter for supervision of other normal pregnancy, unspecified trimester: Secondary | ICD-10-CM | POA: Insufficient documentation

## 2015-03-22 DIAGNOSIS — Z3481 Encounter for supervision of other normal pregnancy, first trimester: Secondary | ICD-10-CM | POA: Diagnosis not present

## 2015-03-22 DIAGNOSIS — K5901 Slow transit constipation: Secondary | ICD-10-CM

## 2015-03-22 DIAGNOSIS — K640 First degree hemorrhoids: Secondary | ICD-10-CM

## 2015-03-22 LAB — POCT URINALYSIS DIPSTICK
Bilirubin, UA: NEGATIVE
Blood, UA: NEGATIVE
GLUCOSE UA: NEGATIVE
Ketones, UA: NEGATIVE
LEUKOCYTES UA: NEGATIVE
NITRITE UA: NEGATIVE
Protein, UA: NEGATIVE
Spec Grav, UA: 1.01
Urobilinogen, UA: NEGATIVE
pH, UA: 7

## 2015-03-22 MED ORDER — POLYETHYLENE GLYCOL 3350 17 GM/SCOOP PO POWD: 1.0000 | Freq: Once | ORAL | Status: DC

## 2015-03-22 MED ORDER — BUTALBITAL-APAP-CAFFEINE 50-325-40 MG PO TABS: 1.0000 | ORAL_TABLET | Freq: Four times a day (QID) | ORAL | Status: DC | PRN

## 2015-03-22 MED ORDER — POLYETHYLENE GLYCOL 3350 17 GM/SCOOP PO POWD: ORAL | Status: DC

## 2015-03-22 MED ORDER — HYDROCORTISONE ACETATE 25 MG RE SUPP: 25.0000 mg | Freq: Two times a day (BID) | RECTAL | Status: DC

## 2015-03-22 NOTE — Progress Notes (Signed)
Subjective:    Amy Davies is being seen today for her first obstetrical visit.  This is not a planned pregnancy. She is at [redacted]w[redacted]d gestation. Her obstetrical history is significant for none, hx of IBS with constipation, states constipation is very bad only about 1 BM per week. Relationship with FOB: significant other, not living together, Shaun. Patient does not intend to breast feed. Pregnancy history fully reviewed.  Is having daily HA, tension type on both sides has tried Tylenol without relief.    The information documented in the HPI was reviewed and verified.  Menstrual History: OB History    Gravida Para Term Preterm AB TAB SAB Ectopic Multiple Living   0 0 0 2      Menarche age: 12  Patient's last menstrual period was 01/05/2015.    Past Medical History  Diagnosis Date  . Irritable bowel syndrome     Past Surgical History  Procedure Laterality Date  . Induced abortion       (Not in a hospital admission) No Known Allergies  Social History  Substance Use Topics  . Smoking status: Former Smoker    Types: Cigarettes  . Smokeless tobacco: Never Used  . Alcohol Use: No     Comment: socially; not since +preg    Family History  Problem Relation Age of Onset  . Asthma Neg Hx   . Diabetes Neg Hx   . Heart disease Neg Hx   . Hypertension Neg Hx   . Kidney disease Neg Hx   . Stroke Neg Hx   . Hearing loss Neg Hx      Review of Systems Constitutional: negative for weight loss Gastrointestinal: negative for vomiting Genitourinary:negative for genital lesions and vaginal discharge and dysuria Musculoskeletal:negative for back pain Behavioral/Psych: negative for abusive relationship, depression, illegal drug usage and tobacco use    Objective:    BP 105/66 mmHg  Pulse 78  Temp(Src) 96.9 F (36.1 C)  Wt 153 lb (69.4 kg)  LMP 01/05/2015 General Appearance:    Alert, cooperative, no distress, appears stated age  Head:    Normocephalic, without obvious  abnormality, atraumatic  Eyes:    PERRL, conjunctiva/corneas clear, EOM's intact, fundi    benign, both eyes  Ears:    Normal TM's and external ear canals, both ears  Nose:   Nares normal, septum midline, mucosa normal, no drainage    or sinus tenderness  Throat:   Lips, mucosa, and tongue normal; teeth and gums normal  Neck:   Supple, symmetrical, trachea midline, no adenopathy;    thyroid:  no enlargement/tenderness/nodules; no carotid   bruit or JVD  Back:     Symmetric, no curvature, ROM normal, no CVA tenderness  Lungs:     Clear to auscultation bilaterally, respirations unlabored  Chest Wall:    No tenderness or deformity   Heart:    Regular rate and rhythm, S1 and S2 normal, no murmur, rub   or gallop  Breast Exam:    No tenderness, masses, or nipple abnormality  Abdomen:     Soft, non-tender, bowel sounds active all four quadrants,    no masses, no organomegaly  Genitalia:    Normal female without lesion, discharge or tenderness  Extremities:   Extremities normal, atraumatic, no cyanosis or edema  Pulses:   2+ and symmetric all extremities  Skin:   Skin color, texture, turgor normal, no rashes or lesions  Lymph nodes:   Cervical, supraclavicular,  and axillary nodes normal  Neurologic:   CNII-XII intact, normal strength, sensation and reflexes    throughout         Cervix:    Long, thick, closed & posterior      FHR: 160 by doppler  Lab Review Urine pregnancy test Labs reviewed yes Radiologic studies reviewed yes Assessment:    Pregnancy at [redacted]w[redacted]d weeks   Constipation with hemorrhoids  Plan:      Prenatal vitamins.  Counseling provided regarding continued use of seat belts, cessation of alcohol consumption, smoking or use of illicit drugs; infection precautions i.e., influenza/TDAP immunizations, toxoplasmosis,CMV, parvovirus, listeria and varicella; workplace safety, exercise during pregnancy; routine dental care, safe medications, sexual activity, hot tubs, saunas,  pools, travel, caffeine use, fish and methlymercury, potential toxins, hair treatments, varicose veins Weight gain recommendations per IOM guidelines reviewed: underweight/BMI< 18.5--> gain 28 - 40 lbs; normal weight/BMI 18.5 - 24.9--> gain 25 - 35 lbs; overweight/BMI 25 - 29.9--> gain 15 - 25 lbs; obese/BMI >30->gain  11 - 20 lbs Problem list reviewed and updated. FIRST/CF mutation testing/NIPT/QUAD SCREEN/fragile X/[name] Jewish population testing/Spinal muscular atrophy discussed: requested. Role of ultrasound in pregnancy discussed; fetal survey: requested. Amniocentesis discussed: not indicated. VBAC calculator score: VBAC consent form provided No orders of the defined types were placed in this encounter.   Orders Placed This Encounter  Procedures  . Culture, OB Urine  . Obstetric panel  . HIV antibody  . Hemoglobinopathy evaluation  . Varicella zoster antibody, IgG  . Vit D  25 hydroxy (rtn osteoporosis monitoring)  . POCT urinalysis dipstick    Follow up in 4 weeks. 50% of 30 min visit spent on counseling and coordination of care.

## 2015-03-23 ENCOUNTER — Encounter: Payer: Medicaid Other | Admitting: Certified Nurse Midwife

## 2015-03-23 LAB — OBSTETRIC PANEL
ANTIBODY SCREEN: NEGATIVE
BASOS ABS: 0 10*3/uL (ref 0.0–0.1)
BASOS PCT: 0 % (ref 0–1)
EOS ABS: 0.1 10*3/uL (ref 0.0–0.7)
Eosinophils Relative: 1 % (ref 0–5)
HEMATOCRIT: 40.2 % (ref 36.0–46.0)
HEMOGLOBIN: 13.6 g/dL (ref 12.0–15.0)
Hepatitis B Surface Ag: NEGATIVE
LYMPHS ABS: 2.5 10*3/uL (ref 0.7–4.0)
Lymphocytes Relative: 29 % (ref 12–46)
MCH: 32 pg (ref 26.0–34.0)
MCHC: 33.8 g/dL (ref 30.0–36.0)
MCV: 94.6 fL (ref 78.0–100.0)
MPV: 8.5 fL — AB (ref 8.6–12.4)
Monocytes Absolute: 0.7 10*3/uL (ref 0.1–1.0)
Monocytes Relative: 8 % (ref 3–12)
NEUTROS PCT: 62 % (ref 43–77)
Neutro Abs: 5.3 10*3/uL (ref 1.7–7.7)
Platelets: 286 10*3/uL (ref 150–400)
RBC: 4.25 MIL/uL (ref 3.87–5.11)
RDW: 13.2 % (ref 11.5–15.5)
RUBELLA: 1.99 {index} — AB (ref <0.90)
Rh Type: POSITIVE
WBC: 8.5 10*3/uL (ref 4.0–10.5)

## 2015-03-23 LAB — HIV ANTIBODY (ROUTINE TESTING W REFLEX): HIV 1&2 Ab, 4th Generation: NONREACTIVE

## 2015-03-23 LAB — CULTURE, OB URINE
Colony Count: NO GROWTH
ORGANISM ID, BACTERIA: NO GROWTH

## 2015-03-23 LAB — VITAMIN D 25 HYDROXY (VIT D DEFICIENCY, FRACTURES): VIT D 25 HYDROXY: 19 ng/mL — AB (ref 30–100)

## 2015-03-23 LAB — VARICELLA ZOSTER ANTIBODY, IGG: VARICELLA IGG: 1091 {index} — AB (ref <135.00)

## 2015-03-24 ENCOUNTER — Other Ambulatory Visit: Payer: Self-pay | Admitting: Certified Nurse Midwife

## 2015-03-24 DIAGNOSIS — B9689 Other specified bacterial agents as the cause of diseases classified elsewhere: Secondary | ICD-10-CM

## 2015-03-24 DIAGNOSIS — N760 Acute vaginitis: Secondary | ICD-10-CM

## 2015-03-24 LAB — PAP, TP IMAGING W/ HPV RNA, RFLX HPV TYPE 16,18/45: HPV MRNA, HIGH RISK: NOT DETECTED

## 2015-03-24 MED ORDER — METRONIDAZOLE 0.75 % VA GEL: 1.0000 | Freq: Two times a day (BID) | VAGINAL | Status: DC

## 2015-03-26 LAB — HEMOGLOBINOPATHY EVALUATION
HEMOGLOBIN OTHER: 0 %
HGB F QUANT: 0 % (ref 0.0–2.0)
Hgb A2 Quant: 2.7 % (ref 2.2–3.2)
Hgb A: 97.3 % (ref 96.8–97.8)
Hgb S Quant: 0 %

## 2015-03-27 ENCOUNTER — Other Ambulatory Visit: Payer: Self-pay | Admitting: Certified Nurse Midwife

## 2015-03-27 LAB — SURESWAB, VAGINOSIS/VAGINITIS PLUS
ATOPOBIUM VAGINAE: 7.5 Log (cells/mL)
C. albicans, DNA: NOT DETECTED
C. glabrata, DNA: NOT DETECTED
C. parapsilosis, DNA: NOT DETECTED
C. trachomatis RNA, TMA: NOT DETECTED
C. tropicalis, DNA: NOT DETECTED
Gardnerella vaginalis: >8 Log (cells/mL)
LACTOBACILLUS SPECIES: NOT DETECTED Log (cells/mL)
N. gonorrhoeae RNA, TMA: NOT DETECTED
T. VAGINALIS RNA, QL TMA: NOT DETECTED

## 2015-03-27 MED ORDER — VITAFOL ULTRA 29-0.6-0.4-200 MG PO CAPS: 1.0000 | ORAL_CAPSULE | Freq: Every day | ORAL | Status: DC

## 2015-04-03 ENCOUNTER — Telehealth: Payer: Self-pay | Admitting: *Deleted

## 2015-04-03 ENCOUNTER — Other Ambulatory Visit: Payer: Self-pay | Admitting: *Deleted

## 2015-04-03 DIAGNOSIS — N760 Acute vaginitis: Secondary | ICD-10-CM

## 2015-04-03 DIAGNOSIS — B9689 Other specified bacterial agents as the cause of diseases classified elsewhere: Secondary | ICD-10-CM

## 2015-04-03 MED ORDER — METRONIDAZOLE 500 MG PO TABS: 500.0000 mg | ORAL_TABLET | Freq: Two times a day (BID) | ORAL | Status: DC

## 2015-04-03 MED ORDER — TERCONAZOLE 0.4 % VA CREA: 1.0000 | TOPICAL_CREAM | Freq: Every day | VAGINAL | Status: DC

## 2015-04-03 NOTE — Telephone Encounter (Signed)
Patient called and wants to know what to take for allergies. 11:41 Phone voice mail full.

## 2015-04-03 NOTE — Progress Notes (Signed)
Pt aware of labs. Pt request oral medication as well as yeast medication be sent to pharmacy.  Pt made aware change in Rx would be sent in.

## 2015-04-04 ENCOUNTER — Telehealth: Payer: Self-pay | Admitting: *Deleted

## 2015-04-04 ENCOUNTER — Other Ambulatory Visit: Payer: Self-pay | Admitting: Certified Nurse Midwife

## 2015-04-04 NOTE — Telephone Encounter (Signed)
Not at this time during her pregnancy.  She can take regular Tylenol if she desires for the HA.  Thank you.  Felisa Bonier CNM

## 2015-04-04 NOTE — Telephone Encounter (Signed)
Patient wants to know if there is something else she can use for her headaches- the medication makes her sleepy.

## 2015-04-06 NOTE — Telephone Encounter (Signed)
9/23 11:19 Patient called the office with questions about medication- 1:56 No answer and mail box is full

## 2015-04-09 ENCOUNTER — Other Ambulatory Visit: Payer: Self-pay | Admitting: *Deleted

## 2015-04-09 DIAGNOSIS — B9689 Other specified bacterial agents as the cause of diseases classified elsewhere: Secondary | ICD-10-CM

## 2015-04-09 DIAGNOSIS — N760 Acute vaginitis: Secondary | ICD-10-CM

## 2015-04-09 MED ORDER — CLINDAMYCIN HCL 300 MG PO CAPS: 300.0000 mg | ORAL_CAPSULE | Freq: Two times a day (BID) | ORAL | Status: DC

## 2015-04-09 NOTE — Telephone Encounter (Signed)
Patient states she can not tolerate the Flagyl- she keeps throwing it up. Can she get Clindamycin? She states she doesn't get yeast as bad when she uses that. Told patient I would check with provider and call in if OK- Dr Clearance Coots [place] Rx

## 2015-04-10 NOTE — Telephone Encounter (Signed)
OK for Clindamycin  po tid x 7 days

## 2015-04-17 ENCOUNTER — Telehealth: Payer: Self-pay

## 2015-04-17 ENCOUNTER — Ambulatory Visit (INDEPENDENT_AMBULATORY_CARE_PROVIDER_SITE_OTHER): Payer: Medicaid Other | Admitting: Certified Nurse Midwife

## 2015-04-17 VITALS — BP 102/65 | HR 81 | Temp 97.5°F | Wt 158.0 lb

## 2015-04-17 DIAGNOSIS — O219 Vomiting of pregnancy, unspecified: Secondary | ICD-10-CM

## 2015-04-17 DIAGNOSIS — Z3482 Encounter for supervision of other normal pregnancy, second trimester: Secondary | ICD-10-CM

## 2015-04-17 MED ORDER — ONDANSETRON HCL 4 MG PO TABS: 4.0000 mg | ORAL_TABLET | Freq: Every day | ORAL | Status: DC | PRN

## 2015-04-17 NOTE — Telephone Encounter (Signed)
Ordered.  Thank you.

## 2015-04-17 NOTE — Telephone Encounter (Signed)
Order for u/s has MFM - take out and will schedule - thanks!

## 2015-04-17 NOTE — Addendum Note (Signed)
Addended by: Samantha Crimes on: 04/17/2015 05:17 PM   Modules accepted: Orders

## 2015-04-17 NOTE — Progress Notes (Signed)
  Subjective:    Amy Davies is a 29 y.o. female being seen today for her obstetrical visit. She is at [redacted]w[redacted]d gestation. Patient reports: headache, nausea, no bleeding, no contractions, no cramping and no leaking.  Problem List Items Addressed This Visit      Other   Supervision of other normal pregnancy, antepartum - Primary   Relevant Orders   POCT urinalysis dipstick   [place] MFM OB COMP + 14 WK   AFP, Quad Screen    Other Visit Diagnoses    Nausea and vomiting during pregnancy prior to [redacted] weeks gestation        Relevant Medications    ondansetron (ZOFRAN) 4 MG tablet      Patient Active Problem List   Diagnosis Date Noted  . Supervision of other normal pregnancy, antepartum 03/22/2015    Objective:     BP 102/65 mmHg  Pulse 81  Temp(Src) 97.5 F (36.4 C)  Wt 158 lb (71.668 kg)  LMP 01/05/2015 Uterine Size: Below umbilicus   FHR: 150  Assessment:    Pregnancy @ [redacted]w[redacted]d  weeks Doing well    Nausea of early pregnancy Hx of HA, currently having daily HA in pregnancy  Plan:    Problem list reviewed and updated. Labs reviewed. Follow up in 4 weeks. FIRST/CF mutation testing/NIPT/QUAD SCREEN/fragile X/[name] Jewish population testing/Spinal muscular atrophy discussed: ordered. Role of ultrasound in pregnancy discussed; fetal survey: ordered. Amniocentesis discussed: not indicated. 50% of 15 minute visit spent on counseling and coordination of care.

## 2015-04-18 LAB — AFP, QUAD SCREEN
AFP: 26.9 ng/mL
CURR GEST AGE: 14.4 wks.days
Down Syndrome Scr Risk Est: 1:1180 {titer}
HCG TOTAL: 82.43 [IU]/mL
INH: 227.7 pg/mL
Interpretation-AFP: NEGATIVE
MOM FOR AFP: 0.86
MOM FOR HCG: 1.4
MoM for INH: 1.21
Open Spina bifida: NEGATIVE
Osb Risk: 1:43300 {titer}
TRI 18 SCR RISK EST: NEGATIVE
UE3 MOM: 0.86
UE3 VALUE: 0.48 ng/mL

## 2015-04-20 ENCOUNTER — Encounter: Payer: Medicaid Other | Admitting: Certified Nurse Midwife

## 2015-05-01 ENCOUNTER — Telehealth: Payer: Self-pay | Admitting: *Deleted

## 2015-05-01 NOTE — Telephone Encounter (Signed)
Patient state she has an ear ache. R ear- no fever or lymph node swelling. Patient also states she has keliods on her breast and back that are beginning to become uncomfortable- painful, stinging sensation. Patient has an appointment tomorrow.

## 2015-05-02 ENCOUNTER — Ambulatory Visit (INDEPENDENT_AMBULATORY_CARE_PROVIDER_SITE_OTHER): Payer: Medicaid Other | Admitting: Certified Nurse Midwife

## 2015-05-02 DIAGNOSIS — J069 Acute upper respiratory infection, unspecified: Secondary | ICD-10-CM

## 2015-05-02 DIAGNOSIS — Z3492 Encounter for supervision of normal pregnancy, unspecified, second trimester: Secondary | ICD-10-CM

## 2015-05-02 DIAGNOSIS — L910 Hypertrophic scar: Secondary | ICD-10-CM

## 2015-05-02 DIAGNOSIS — H6123 Impacted cerumen, bilateral: Secondary | ICD-10-CM

## 2015-05-02 LAB — POCT URINALYSIS DIPSTICK
BILIRUBIN UA: NEGATIVE
Glucose, UA: NEGATIVE
KETONES UA: NEGATIVE
Leukocytes, UA: NEGATIVE
Nitrite, UA: NEGATIVE
PH UA: 6
PROTEIN UA: NEGATIVE
RBC UA: NEGATIVE
SPEC GRAV UA: 1.015
Urobilinogen, UA: NEGATIVE

## 2015-05-02 MED ORDER — PSEUDOEPHEDRINE HCL ER 120 MG PO TB12
120.0000 mg | ORAL_TABLET | Freq: Two times a day (BID) | ORAL | Status: DC
Start: 2015-05-02 — End: 2015-07-10

## 2015-05-02 MED ORDER — DIPHENHYDRAMINE HCL 25 MG PO TABS: 25.0000 mg | ORAL_TABLET | Freq: Four times a day (QID) | ORAL | Status: DC | PRN

## 2015-05-02 MED ORDER — HYDROCORTISONE 1 % EX CREA: 1.0000 "application " | TOPICAL_CREAM | Freq: Two times a day (BID) | CUTANEOUS | Status: DC

## 2015-05-02 MED ORDER — CARBAMIDE PEROXIDE 6.5 % OT SOLN: 10.0000 [drp] | Freq: Two times a day (BID) | OTIC | Status: DC

## 2015-05-02 NOTE — Progress Notes (Signed)
  Subjective:    Amy Davies is a 29 y.o. female being seen today for her obstetrical visit. She is at 4983w5d gestation. Patient reports: no bleeding, no contractions, no cramping, no leaking and bilateral ear pain, with URI symptoms that started a few days ago, denies fever or HA.  Also, has itching with her keloid scars and desires a cream to help.  Problem List Items Addressed This Visit    None    Visit Diagnoses    URI (upper respiratory infection)    -  Primary    Relevant Medications    diphenhydrAMINE (BENADRYL) 25 MG tablet    pseudoephedrine (SUDAFED 12 HOUR) 120 MG 12 hr tablet    Cerumen impaction, bilateral        Relevant Medications    carbamide peroxide (DEBROX) 6.5 % otic solution    Keloid scar        Relevant Medications    hydrocortisone cream 1 %    Prenatal care, second trimester        Relevant Orders    POCT urinalysis dipstick (Completed)      Patient Active Problem List   Diagnosis Date Noted  . Supervision of other normal pregnancy, antepartum 03/22/2015    Objective:     LMP 01/05/2015 Uterine Size: Below umbilicus   FHR: 145  Assessment:    Pregnancy @ 1983w5d  weeks Doing well   Bilateral cerumen impaction URI symptoms Keloid scars  Plan:    Problem list reviewed and updated. Labs reviewed.  Follow up in 4 weeks. FIRST/CF mutation testing/NIPT/QUAD SCREEN/fragile X/[name] Jewish population testing/Spinal muscular atrophy discussed: ordered. Role of ultrasound in pregnancy discussed; fetal survey: ordered. Amniocentesis discussed: not indicated. 50% of 25 minute visit spent on counseling and coordination of care.

## 2015-05-15 ENCOUNTER — Encounter: Payer: Medicaid Other | Admitting: Certified Nurse Midwife

## 2015-05-24 ENCOUNTER — Encounter: Payer: Self-pay | Admitting: *Deleted

## 2015-05-24 ENCOUNTER — Ambulatory Visit (INDEPENDENT_AMBULATORY_CARE_PROVIDER_SITE_OTHER): Payer: Medicaid Other

## 2015-05-24 ENCOUNTER — Ambulatory Visit (INDEPENDENT_AMBULATORY_CARE_PROVIDER_SITE_OTHER): Payer: Medicaid Other | Admitting: Certified Nurse Midwife

## 2015-05-24 DIAGNOSIS — O43122 Velamentous insertion of umbilical cord, second trimester: Secondary | ICD-10-CM

## 2015-05-24 DIAGNOSIS — Z36 Encounter for antenatal screening of mother: Secondary | ICD-10-CM | POA: Diagnosis not present

## 2015-05-24 DIAGNOSIS — Z3482 Encounter for supervision of other normal pregnancy, second trimester: Secondary | ICD-10-CM

## 2015-05-24 LAB — POCT URINALYSIS DIPSTICK
BILIRUBIN UA: NEGATIVE
GLUCOSE UA: NEGATIVE
KETONES UA: NEGATIVE
Leukocytes, UA: NEGATIVE
NITRITE UA: NEGATIVE
PH UA: 7
Protein, UA: NEGATIVE
RBC UA: NEGATIVE
Spec Grav, UA: 1.01
Urobilinogen, UA: NEGATIVE

## 2015-05-24 NOTE — Progress Notes (Signed)
Subjective:    Amy Davies is a 29 y.o. female being seen today for her obstetrical visit. She is at 6367w6d gestation. Patient reports: no complaints . Fetal movement: normal.  Discussed velamentous cord insertion results from ultrasound.    Problem List Items Addressed This Visit    None    Visit Diagnoses    Encounter for supervision of other normal pregnancy in second trimester    -  Primary    Relevant Orders    POCT urinalysis dipstick (Completed)    Velamentous insertion of umbilical cord, second trimester        Relevant Orders    AMB Referral to Maternal Fetal Medicine (MFM)    US MFM OB COMP + 14 WK      Patient Active Problem List   Diagnosis Date Noted  . Supervision of other normal pregnancy, antepartum 03/22/2015   Objective:    LMP 01/05/2015 FHT: 140 BPM  Uterine Size: size equals dates and at U     Assessment:    Pregnancy @ 7667w6d    US: velamentous cord insertion Plan:   MFM consult and US ordered  OBGCT: discussed. Signs and symptoms of preterm labor: discussed.  Labs, problem list reviewed and updated 2 hr GTT planned Follow up in 4 weeks.  2

## 2015-05-29 ENCOUNTER — Other Ambulatory Visit: Payer: Self-pay | Admitting: Obstetrics

## 2015-05-29 DIAGNOSIS — O43122 Velamentous insertion of umbilical cord, second trimester: Secondary | ICD-10-CM

## 2015-06-01 ENCOUNTER — Ambulatory Visit (HOSPITAL_COMMUNITY)
Admission: RE | Admit: 2015-06-01 | Discharge: 2015-06-01 | Disposition: A | Discharge status: Home / Self Care | Payer: Medicaid Other | Source: Ambulatory Visit | Attending: Certified Nurse Midwife | Admitting: Certified Nurse Midwife

## 2015-06-01 ENCOUNTER — Other Ambulatory Visit: Payer: Self-pay | Admitting: Certified Nurse Midwife

## 2015-06-01 ENCOUNTER — Inpatient Hospital Stay (HOSPITAL_COMMUNITY): Admission: RE | Admit: 2015-06-01 | Payer: Self-pay | Source: Ambulatory Visit

## 2015-06-01 ENCOUNTER — Encounter (HOSPITAL_COMMUNITY): Payer: Self-pay

## 2015-06-01 DIAGNOSIS — O359XX0 Maternal care for (suspected) fetal abnormality and damage, unspecified, not applicable or unspecified: Secondary | ICD-10-CM

## 2015-06-01 DIAGNOSIS — O43122 Velamentous insertion of umbilical cord, second trimester: Secondary | ICD-10-CM

## 2015-06-01 DIAGNOSIS — Z3689 Encounter for other specified antenatal screening: Secondary | ICD-10-CM

## 2015-06-01 DIAGNOSIS — Z3A21 21 weeks gestation of pregnancy: Secondary | ICD-10-CM

## 2015-06-01 DIAGNOSIS — O358XX0 Maternal care for other (suspected) fetal abnormality and damage, not applicable or unspecified: Secondary | ICD-10-CM | POA: Diagnosis not present

## 2015-06-01 DIAGNOSIS — O43192 Other malformation of placenta, second trimester: Secondary | ICD-10-CM | POA: Diagnosis not present

## 2015-06-01 DIAGNOSIS — Z36 Encounter for antenatal screening of mother: Secondary | ICD-10-CM | POA: Insufficient documentation

## 2015-06-04 ENCOUNTER — Other Ambulatory Visit: Payer: Self-pay | Admitting: Certified Nurse Midwife

## 2015-06-12 ENCOUNTER — Ambulatory Visit (INDEPENDENT_AMBULATORY_CARE_PROVIDER_SITE_OTHER): Payer: Medicaid Other | Admitting: Certified Nurse Midwife

## 2015-06-12 VITALS — BP 106/70 | HR 85 | Wt 174.8 lb

## 2015-06-12 DIAGNOSIS — B373 Candidiasis of vulva and vagina: Secondary | ICD-10-CM

## 2015-06-12 DIAGNOSIS — Z3482 Encounter for supervision of other normal pregnancy, second trimester: Secondary | ICD-10-CM

## 2015-06-12 DIAGNOSIS — B3731 Acute candidiasis of vulva and vagina: Secondary | ICD-10-CM

## 2015-06-12 LAB — POCT URINALYSIS DIPSTICK
Bilirubin, UA: NEGATIVE
Glucose, UA: NEGATIVE
KETONES UA: NEGATIVE
Leukocytes, UA: NEGATIVE
Nitrite, UA: NEGATIVE
PH UA: 5
PROTEIN UA: NEGATIVE
SPEC GRAV UA: 1.015
Urobilinogen, UA: NEGATIVE

## 2015-06-12 MED ORDER — FLUCONAZOLE 100 MG PO TABS
100.0000 mg | ORAL_TABLET | Freq: Once | ORAL | Status: DC
Start: 2015-06-12 — End: 2015-07-10

## 2015-06-12 NOTE — Progress Notes (Signed)
Subjective:    Amy Davies is a 29 y.o. female being Amy Davies today for her obstetrical visit. She is at 2566w4d gestation. Patient reports: backache, no bleeding, no contractions, no cramping, no leaking, vaginal irritation and low pelvic pain for several weeks . 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  Is using pillows to sleep at night.  Fetal movement: normal.  Problem List Items Addressed This Visit    None    Visit Diagnoses    Encounter for supervision of other normal pregnancy in second trimester    -  Primary    Relevant Medications    fluconazole (DIFLUCAN) 100 MG tablet    Other Relevant Orders    POCT urinalysis dipstick    Vulvovaginal candidiasis        Relevant Medications    fluconazole (DIFLUCAN) 100 MG tablet      Patient Active Problem List   Diagnosis Date Noted  . Supervision of other normal pregnancy, antepartum 03/22/2015   Objective:    BP 106/70 mmHg  Pulse 85  Wt 174 lb 12.8 oz (79.289 kg)  LMP 01/05/2015 FHT: 140 BPM  Uterine Size: size equals dates   Cervix: long, thick, closed and posterior  Assessment:    Pregnancy @ 4666w4d   Lumbar and round ligament pain Vulvovaginal candidasis  Plan:    OBGCT: discussed and ordered for next visit. Signs and symptoms of preterm labor: discussed.  Mother to be abdominal support belt RX Labs, problem list reviewed and updated 2 hr GTT planned Follow up in 4 weeks.

## 2015-06-15 ENCOUNTER — Other Ambulatory Visit: Payer: Self-pay | Admitting: Certified Nurse Midwife

## 2015-06-16 LAB — SURESWAB, VAGINOSIS/VAGINITIS PLUS
ATOPOBIUM VAGINAE: NOT DETECTED Log (cells/mL)
C. GLABRATA, DNA: NOT DETECTED
C. TRACHOMATIS RNA, TMA: NOT DETECTED
C. albicans, DNA: NOT DETECTED
C. parapsilosis, DNA: NOT DETECTED
C. tropicalis, DNA: NOT DETECTED
GARDNERELLA VAGINALIS: 5.3 Log (cells/mL)
LACTOBACILLUS SPECIES: 7 Log (cells/mL)
MEGASPHAERA SPECIES: NOT DETECTED Log (cells/mL)
N. GONORRHOEAE RNA, TMA: NOT DETECTED
T. vaginalis RNA, QL TMA: NOT DETECTED

## 2015-06-20 ENCOUNTER — Encounter: Payer: Medicaid Other | Admitting: Certified Nurse Midwife

## 2015-06-27 ENCOUNTER — Other Ambulatory Visit: Payer: Self-pay | Admitting: Certified Nurse Midwife

## 2015-06-27 ENCOUNTER — Other Ambulatory Visit: Payer: Self-pay | Admitting: *Deleted

## 2015-06-27 DIAGNOSIS — O43192 Other malformation of placenta, second trimester: Secondary | ICD-10-CM

## 2015-07-10 ENCOUNTER — Ambulatory Visit (INDEPENDENT_AMBULATORY_CARE_PROVIDER_SITE_OTHER): Payer: Medicaid Other | Admitting: Certified Nurse Midwife

## 2015-07-10 ENCOUNTER — Other Ambulatory Visit: Payer: Medicaid Other

## 2015-07-10 VITALS — BP 101/68 | HR 91 | Temp 98.1°F | Wt 181.0 lb

## 2015-07-10 DIAGNOSIS — Z3482 Encounter for supervision of other normal pregnancy, second trimester: Secondary | ICD-10-CM

## 2015-07-10 DIAGNOSIS — Z3490 Encounter for supervision of normal pregnancy, unspecified, unspecified trimester: Secondary | ICD-10-CM

## 2015-07-10 DIAGNOSIS — Z3A26 26 weeks gestation of pregnancy: Secondary | ICD-10-CM

## 2015-07-10 LAB — CBC
HEMATOCRIT: 37.7 % (ref 36.0–46.0)
Hemoglobin: 12.4 g/dL (ref 12.0–15.0)
MCH: 31.4 pg (ref 26.0–34.0)
MCHC: 32.9 g/dL (ref 30.0–36.0)
MCV: 95.4 fL (ref 78.0–100.0)
MPV: 8.7 fL (ref 8.6–12.4)
Platelets: 292 10*3/uL (ref 150–400)
RBC: 3.95 MIL/uL (ref 3.87–5.11)
RDW: 13.4 % (ref 11.5–15.5)
WBC: 8.8 10*3/uL (ref 4.0–10.5)

## 2015-07-10 LAB — POCT URINALYSIS DIPSTICK
Bilirubin, UA: NEGATIVE
Glucose, UA: NEGATIVE
Ketones, UA: NEGATIVE
Leukocytes, UA: NEGATIVE
Nitrite, UA: NEGATIVE
PROTEIN UA: NEGATIVE
SPEC GRAV UA: 1.015
UROBILINOGEN UA: NEGATIVE
pH, UA: 6

## 2015-07-10 NOTE — Progress Notes (Signed)
Patient reports she is doing well 

## 2015-07-10 NOTE — Progress Notes (Signed)
Subjective:    Amy Davies is a 29 y.o. female being seen today for her obstetrical visit. She is at 6468w4d gestation. Patient reports: no complaints . Fetal movement: normal.  Patient aware of f/u ultrasound and schedule.    Problem List Items Addressed This Visit    None    Visit Diagnoses    Prenatal care, unspecified trimester    -  Primary    Relevant Orders    POCT urinalysis dipstick (Completed)    Glucose Tolerance, 2 Hours w/1 Hour    CBC    HIV antibody    RPR      Patient Active Problem List   Diagnosis Date Noted  . Supervision of other normal pregnancy, antepartum 03/22/2015   Objective:    BP 101/68 mmHg  Pulse 91  Temp(Src) 98.1 F (36.7 C)  Wt 181 lb (82.101 kg)  LMP 01/05/2015 FHT: 150 BPM  Uterine Size: size equals dates     Assessment:    Pregnancy @ 5068w4d    Doing well.    Plan:    OBGCT: ordered. Signs and symptoms of preterm labor: discussed.  Labs, problem list reviewed and updated 2 hr GTT today Follow up in 2 weeks.

## 2015-07-11 LAB — RPR

## 2015-07-11 LAB — HIV ANTIBODY (ROUTINE TESTING W REFLEX): HIV: NONREACTIVE

## 2015-07-11 LAB — GLUCOSE TOLERANCE, 2 HOURS W/ 1HR
GLUCOSE, 2 HOUR: 86 mg/dL (ref 70–139)
GLUCOSE, FASTING: 71 mg/dL (ref 65–99)
GLUCOSE: 80 mg/dL (ref 70–170)

## 2015-07-17 ENCOUNTER — Encounter (HOSPITAL_COMMUNITY): Payer: Self-pay

## 2015-07-17 ENCOUNTER — Other Ambulatory Visit: Payer: Self-pay | Admitting: Certified Nurse Midwife

## 2015-07-17 ENCOUNTER — Ambulatory Visit (HOSPITAL_COMMUNITY)
Admission: RE | Admit: 2015-07-17 | Discharge: 2015-07-17 | Disposition: A | Discharge status: Home / Self Care | Payer: Medicaid Other | Source: Ambulatory Visit | Attending: Certified Nurse Midwife | Admitting: Certified Nurse Midwife

## 2015-07-17 DIAGNOSIS — O43122 Velamentous insertion of umbilical cord, second trimester: Secondary | ICD-10-CM | POA: Diagnosis not present

## 2015-07-17 DIAGNOSIS — O43192 Other malformation of placenta, second trimester: Secondary | ICD-10-CM

## 2015-07-17 DIAGNOSIS — O43129 Velamentous insertion of umbilical cord, unspecified trimester: Secondary | ICD-10-CM

## 2015-07-17 DIAGNOSIS — Z3A27 27 weeks gestation of pregnancy: Secondary | ICD-10-CM

## 2015-07-18 ENCOUNTER — Other Ambulatory Visit: Payer: Self-pay | Admitting: Certified Nurse Midwife

## 2015-07-19 ENCOUNTER — Ambulatory Visit (HOSPITAL_COMMUNITY): Payer: Medicaid Other

## 2015-07-24 ENCOUNTER — Telehealth: Payer: Self-pay | Admitting: *Deleted

## 2015-07-24 ENCOUNTER — Other Ambulatory Visit: Payer: Self-pay | Admitting: *Deleted

## 2015-07-24 ENCOUNTER — Other Ambulatory Visit: Payer: Self-pay | Admitting: Certified Nurse Midwife

## 2015-07-24 ENCOUNTER — Ambulatory Visit (INDEPENDENT_AMBULATORY_CARE_PROVIDER_SITE_OTHER): Payer: Medicaid Other | Admitting: Certified Nurse Midwife

## 2015-07-24 VITALS — BP 104/68 | HR 89 | Temp 98.4°F | Wt 186.0 lb

## 2015-07-24 DIAGNOSIS — N760 Acute vaginitis: Secondary | ICD-10-CM

## 2015-07-24 DIAGNOSIS — B9689 Other specified bacterial agents as the cause of diseases classified elsewhere: Secondary | ICD-10-CM

## 2015-07-24 DIAGNOSIS — O0993 Supervision of high risk pregnancy, unspecified, third trimester: Secondary | ICD-10-CM

## 2015-07-24 DIAGNOSIS — A499 Bacterial infection, unspecified: Secondary | ICD-10-CM

## 2015-07-24 DIAGNOSIS — B3731 Acute candidiasis of vulva and vagina: Secondary | ICD-10-CM

## 2015-07-24 DIAGNOSIS — B373 Candidiasis of vulva and vagina: Secondary | ICD-10-CM

## 2015-07-24 DIAGNOSIS — Z3482 Encounter for supervision of other normal pregnancy, second trimester: Secondary | ICD-10-CM

## 2015-07-24 LAB — POCT URINALYSIS DIPSTICK
Bilirubin, UA: NEGATIVE
Glucose, UA: NEGATIVE
Ketones, UA: NEGATIVE
Leukocytes, UA: NEGATIVE
NITRITE UA: NEGATIVE
PROTEIN UA: NEGATIVE
RBC UA: NEGATIVE
SPEC GRAV UA: 1.01
UROBILINOGEN UA: NEGATIVE
pH, UA: 7

## 2015-07-24 MED ORDER — TERCONAZOLE 0.4 % VA CREA: 1.0000 | TOPICAL_CREAM | Freq: Every day | VAGINAL | Status: DC

## 2015-07-24 MED ORDER — METRONIDAZOLE 500 MG PO TABS: 500.0000 mg | ORAL_TABLET | Freq: Two times a day (BID) | ORAL | Status: DC

## 2015-07-24 MED ORDER — FLUCONAZOLE 100 MG PO TABS: 100.0000 mg | ORAL_TABLET | Freq: Once | ORAL | Status: DC

## 2015-07-24 MED ORDER — CLINDAMYCIN HCL 300 MG PO CAPS: 300.0000 mg | ORAL_CAPSULE | Freq: Two times a day (BID) | ORAL | Status: DC

## 2015-07-24 NOTE — Telephone Encounter (Signed)
Patient states she can't take flagyl it makes her sick. She is requesting Clindamycin instead for treatment of her BV. Told patient I would check with Rachelle and let her know.

## 2015-07-24 NOTE — Progress Notes (Signed)
Subjective:    Amy Davies is a 30 y.o. female being seen today for her obstetrical visit. She is at 5864w4d gestation. Patient reports no bleeding, no contractions, no cramping, no leaking, vaginal irritation and states she lost her mucous plug a few days ago.  Brought in picture, thick green discharge on toilet paper, pad size, no spotting. Fetal movement: normal.  Problem List Items Addressed This Visit      Other   Supervision of other normal pregnancy, antepartum - Primary   Relevant Orders   POCT urinalysis dipstick     Patient Active Problem List   Diagnosis Date Noted  . Supervision of other normal pregnancy, antepartum 03/22/2015   Objective:    BP 104/68 mmHg  Pulse 89  Temp(Src) 98.4 F (36.9 C)  Wt 186 lb (84.369 kg)  LMP 01/05/2015 FHT:  158 BPM  Uterine Size: size equals dates  Presentation: breech   Cervix: 1cm dilated, thick, posterior, soft  Assessment:    Pregnancy @ 2264w4d weeks   Vaginitis: BV & vulvovaginal candidiasis Plan:     labs reviewed, problem list updated Consent signed. GBS planning TDAP offered  Rhogam given for RH negative Pediatrician: discussed. Infant feeding: plans to breastfeed. Maternity leave: discussed. Cigarette smoking: never smoked. Orders Placed This Encounter  Procedures  . POCT urinalysis dipstick   No orders of the defined types were placed in this encounter.   Follow up in 2 Weeks.

## 2015-07-25 ENCOUNTER — Inpatient Hospital Stay (HOSPITAL_COMMUNITY)
Admission: AD | Admit: 2015-07-25 | Discharge: 2015-07-25 | Disposition: A | Discharge status: Home / Self Care | Payer: Medicaid Other | Source: Ambulatory Visit | Attending: Obstetrics | Admitting: Obstetrics

## 2015-07-25 ENCOUNTER — Encounter (HOSPITAL_COMMUNITY): Payer: Self-pay | Admitting: *Deleted

## 2015-07-25 ENCOUNTER — Telehealth: Payer: Self-pay | Admitting: *Deleted

## 2015-07-25 DIAGNOSIS — K5900 Constipation, unspecified: Secondary | ICD-10-CM | POA: Insufficient documentation

## 2015-07-25 DIAGNOSIS — O9989 Other specified diseases and conditions complicating pregnancy, childbirth and the puerperium: Secondary | ICD-10-CM | POA: Insufficient documentation

## 2015-07-25 DIAGNOSIS — Z87891 Personal history of nicotine dependence: Secondary | ICD-10-CM | POA: Insufficient documentation

## 2015-07-25 DIAGNOSIS — Z3A28 28 weeks gestation of pregnancy: Secondary | ICD-10-CM | POA: Diagnosis not present

## 2015-07-25 DIAGNOSIS — K589 Irritable bowel syndrome without diarrhea: Secondary | ICD-10-CM | POA: Insufficient documentation

## 2015-07-25 DIAGNOSIS — K5909 Other constipation: Secondary | ICD-10-CM | POA: Diagnosis not present

## 2015-07-25 LAB — FETAL FIBRONECTIN: FETAL FIBRONECTIN: NEGATIVE

## 2015-07-25 MED ORDER — MAGNESIUM CITRATE PO SOLN
1.0000 | Freq: Once | ORAL | Status: AC
  Administered 2015-07-25: 1 via ORAL
  Filled 2015-07-25: qty 296

## 2015-07-25 MED ORDER — NIFEDIPINE 10 MG PO CAPS
10.0000 mg | ORAL_CAPSULE | Freq: Once | ORAL | Status: AC
  Administered 2015-07-25: 10 mg via ORAL
  Filled 2015-07-25: qty 1

## 2015-07-25 NOTE — MAU Provider Note (Signed)
Chief Complaint:  No chief complaint on file.   First Provider Initiated Contact with Patient 07/25/15 1736      Amy Davies is a 30 y.o. [id number] at 52w5dwho presents to maternity admissions reporting  Contractions and constipation.  Had a FFn negative yesterday and cervix was 1cm.  Has been constipated for over a week despite daily Miralax.  Has a longstanding problem with this. She reports good fetal movement, denies LOF, vaginal bleeding, vaginal itching/burning, urinary symptoms, h/a, dizziness, n/v, diarrhea, or fever/chills.  She denies headache, visual changes or abdominal pain.   Constipation This is a chronic problem. The current episode started 1 to 4 weeks ago. The problem is unchanged. Her stool frequency is 1 time per week or less. The patient is not on a high fiber diet. She does not exercise regularly. There has not been adequate water intake. Associated symptoms include abdominal pain, hemorrhoids (holds stool due to hemorrhoids) and rectal pain (with hemorrhoids). Pertinent negatives include no back pain, diarrhea, difficulty urinating, fecal incontinence, fever, nausea or vomiting. She has tried laxatives for the symptoms. The treatment provided no relief.    Past Medical History: Past Medical History  Diagnosis Date  . Irritable bowel syndrome     Past obstetric history: OB History  Gravida Para Term Preterm AB SAB TAB Ectopic Multiple Living  6 2 2  0 3 1 2  0 0 2    # Outcome Date GA Lbr Len/2nd Weight Sex Delivery Anes PTL Lv  6 Current           5 Term 07/17/10 [redacted]w[redacted]Davies  3.629 kg (8 lb) M Vag-Spont EPI N Y  4 SAB 2009 [redacted]w[redacted]Davies    SAB     3 Term 04/12/03 [redacted]w[redacted]Davies  3.43 kg (7 lb 9 oz) M Vag-Spont EPI N Y  2 TAB           1 TAB               Past Surgical History: Past Surgical History  Procedure Laterality Date  . Induced abortion    . No past surgeries      Family History: Family History  Problem Relation Age of Onset  . Asthma Neg Hx   . Diabetes Neg Hx   .  Heart disease Neg Hx   . Hypertension Neg Hx   . Kidney disease Neg Hx   . Stroke Neg Hx   . Hearing loss Neg Hx     Social History: Social History  Substance Use Topics  . Smoking status: Former Smoker    Types: Cigarettes  . Smokeless tobacco: Never Used     Comment: July 2016  . Alcohol Use: No     Comment: socially; not since +preg    Allergies: No Known Allergies  Meds:  Prescriptions prior to admission  Medication Sig Dispense Refill Last Dose  . acetaminophen (TYLENOL) 325 MG tablet Take 650 mg by mouth every 6 (six) hours as needed for headache.   Taking  . butalbital-acetaminophen-caffeine (FIORICET) 50-325-40 MG per tablet Take 1-2 tablets by mouth every 6 (six) hours as needed for headache. (Patient not taking: Reported on 07/17/2015) 45 tablet 4 Not Taking  . clindamycin (CLEOCIN) 300 MG capsule Take 1 capsule (300 mg total) by mouth 2 (two) times daily. 14 capsule 0   . docusate sodium (COLACE) 250 MG capsule Take 1 capsule (250 mg total) by mouth 2 (two) times daily. 60 capsule 11 Taking  . fluconazole (DIFLUCAN) 100  MG tablet Take 1 tablet (100 mg total) by mouth once. Repeat dose in 48-72 hour. 3 tablet 0   . hydrocortisone (ANUSOL-HC) 25 MG suppository Place 1 suppository (25 mg total) rectally 2 (two) times daily. 12 suppository 4 Taking  . hydrocortisone cream 1 % Apply 1 application topically 2 (two) times daily. (Patient not taking: Reported on 07/17/2015) 30 g 0 Not Taking  . ondansetron (ZOFRAN) 4 MG tablet Take 1 tablet (4 mg total) by mouth daily as needed. (Patient not taking: Reported on 07/24/2015) 30 tablet 1 Not Taking  . polyethylene glycol powder (GLYCOLAX/MIRALAX) powder Take 17 grams/day. 500 g 4 Taking  . Prenat-Fe Poly-Methfol-FA-DHA (VITAFOL ULTRA) 29-0.6-0.4-200 MG CAPS Take 1 tablet by mouth daily at 10 pm. 30 capsule 12 Taking  . terconazole (TERAZOL 7) 0.4 % vaginal cream Place 1 applicator vaginally at bedtime. 45 g 0     ROS:  Review of  Systems  Constitutional: Negative for fever, chills and fatigue.  Gastrointestinal: Positive for abdominal pain, constipation, rectal pain (with hemorrhoids) and hemorrhoids (holds stool due to hemorrhoids). Negative for nausea, vomiting and diarrhea.  Genitourinary: Negative for decreased urine volume, vaginal bleeding, vaginal discharge and difficulty urinating.  Musculoskeletal: Negative for back pain.   I have reviewed patient's Past Medical Hx, Surgical Hx, Family Hx, Social Hx, medications and allergies.   Physical Exam  Patient Vitals for the past 24 hrs:  BP Temp Temp src Pulse Resp SpO2  07/25/15 1719 129/75 mmHg 98.1 F (36.7 C) Oral 92 18 100 %   Constitutional: Well-developed, well-nourished female in no acute distress.  Cardiovascular: normal rate and rhythm Respiratory: normal effort, clear to auscultate bilaterally GI: Abd soft, non-tender, gravid appropriate for gestational age.   No rebound or guarding. MS: Extremities nontender, no edema, normal ROM Neurologic: Alert and oriented x 4.  GU: Neg CVAT.  PELVIC EXAM: Bimanual exam: Cervix firm, neg CMT, uterus nontender, Fundal Height consistent with dates, adnexa without tenderness, enlargement, or mass    Large amount of hard stool in vault.      Dilation: Fingertip Effacement (%): 30 Cervical Position: Posterior Station: Ballotable Presentation: Vertex Exam by:: Mayford KnifeWilliams CNM  FHT:  Baseline 140 , moderate variability, accelerations present, no decelerations Contractions:  Irregular     States she feels them.   Labs: No results found for this or any previous visit (from the past 24 hour(s)). O/POS/-- (09/08 [id number]) Results for Amy Davies, Amy Davies (MRN [id number]) as of 07/25/2015 18:34  Ref. Range 07/24/2015 11:28  Fetal Fibronectin Latest Ref Range: NEGATIVE  NEG   Imaging:  [place] Mfm Ob Follow Up  07/17/2015  OBSTETRICAL ULTRASOUND: This exam was performed within a [facility]. The OB US report  was generated in the [facility] system, and faxed to the ordering physician.  This report is available in the [employer] PACS. See the [facility] Obstetric US report via the Image Link.   MAU Course/MDM:  NST reviewed Consult Dr Clearance CootsHarper with presentation, exam findings and test results.  Treatments in MAU included Procardia and Magnesium Citrate.  Still had some cramps after first Procardia so second dose given with relief. Patient requesting to go home.  Pt stable at time of discharge.  Assessment: SIUP at 660w5d  Preterm contractions without cervical change and negative fetal fibronectin Chronic Constipation  Plan: Discharge home Preterm Labor precautions and fetal kick counts Recommend Fleet enema to supplement Mag Citrate. Follow up in Office for prenatal visits and recheck of cervix as needed  Medication List    ASK your doctor about these medications        acetaminophen 325 MG tablet  Commonly known as:  TYLENOL  Take 650 mg by mouth every 6 (six) hours as needed for headache.     butalbital-acetaminophen-caffeine 50-325-40 MG tablet  Commonly known as:  FIORICET  Take 1-2 tablets by mouth every 6 (six) hours as needed for headache.     clindamycin 300 MG capsule  Commonly known as:  CLEOCIN  Take 1 capsule (300 mg total) by mouth 2 (two) times daily.     docusate sodium 250 MG capsule  Commonly known as:  COLACE  Take 1 capsule (250 mg total) by mouth 2 (two) times daily.     fluconazole 100 MG tablet  Commonly known as:  DIFLUCAN  Take 1 tablet (100 mg total) by mouth once. Repeat dose in 48-72 hour.     hydrocortisone 25 MG suppository  Commonly known as:  ANUSOL-HC  Place 1 suppository (25 mg total) rectally 2 (two) times daily.     hydrocortisone cream 1 %  Apply 1 application topically 2 (two) times daily.     ondansetron 4 MG tablet  Commonly known as:  ZOFRAN  Take 1 tablet (4 mg total) by mouth daily as needed.     polyethylene glycol powder powder  Commonly  known as:  GLYCOLAX/MIRALAX  Take 17 grams/day.     terconazole 0.4 % vaginal cream  Commonly known as:  TERAZOL 7  Place 1 applicator vaginally at bedtime.     VITAFOL ULTRA 29-0.6-0.4-200 MG Caps  Take 1 tablet by mouth daily at 10 pm.        Wynelle Bourgeois CNM, MSN Certified Nurse-Midwife 07/25/2015 6:29 PM

## 2015-07-25 NOTE — MAU Note (Addendum)
Called dr's office, was told to come over and get monitored because she was having some contractions., denies hx of PTL or PTD.  Contractions are not painful, but has been dizzy with them, pt's mother confirms she is holding her breath

## 2015-07-25 NOTE — Telephone Encounter (Signed)
Patient is calling for her results- fetal fibronectin- negative. In talking to patient she states she has been having Braxton Hicks contractions since she left the office. She did not mention this at her appointment. In discussion- we talked about preterm labor and uterine irritability and that Deberah PeltonBraxton Hicks happen at 35 weeks. Patient wants to know if she can resume normal activity- sex. Told patient to let me speak to [facility] first and that I would call her back. Per Boykin Reaperachelle- patient needs to go to MAU for check. We will let her know about her normal activity after she is examined and monitored. She may need fluids or it could be some thing more. Patient agrees to go.

## 2015-07-25 NOTE — Discharge Instructions (Signed)

## 2015-07-25 NOTE — MAU Note (Signed)
Neg fFN yesterday

## 2015-07-25 NOTE — MAU Note (Signed)
Urine sent to lab 

## 2015-07-26 ENCOUNTER — Other Ambulatory Visit: Payer: Self-pay | Admitting: Certified Nurse Midwife

## 2015-07-28 LAB — SURESWAB, VAGINOSIS/VAGINITIS PLUS
Atopobium vaginae: NOT DETECTED Log (cells/mL)
C. ALBICANS, DNA: NOT DETECTED
C. GLABRATA, DNA: NOT DETECTED
C. PARAPSILOSIS, DNA: NOT DETECTED
C. TRACHOMATIS RNA, TMA: NOT DETECTED
C. tropicalis, DNA: NOT DETECTED
Gardnerella vaginalis: NOT DETECTED Log (cells/mL)
LACTOBACILLUS SPECIES: 7.4 Log (cells/mL)
MEGASPHAERA SPECIES: NOT DETECTED Log (cells/mL)
N. GONORRHOEAE RNA, TMA: NOT DETECTED
T. vaginalis RNA, QL TMA: NOT DETECTED

## 2015-07-30 ENCOUNTER — Telehealth: Payer: Self-pay | Admitting: *Deleted

## 2015-07-30 NOTE — Telephone Encounter (Signed)
Patient requested call back- she is not sure when to follow up. 11:01 Patient was seen at hospital for constipation. She has chronic constipation and it has gotten worse with pregnancy. She finally was able to move her bowels on 1/12 and 1/13- but has not gone since. She is using a stool softener and magnesium. Advised increase fluids, use MOM, may need another Fleets (not to over use), warm prune juice and foods that promote bowels moving - she may need to change from Colace to [employer].  Patient understands and will call back if her bowels do not move in the next 2 days- she has an appointment next week.

## 2015-08-05 ENCOUNTER — Inpatient Hospital Stay (HOSPITAL_COMMUNITY)
Admission: AD | Admit: 2015-08-05 | Discharge: 2015-08-05 | Disposition: A | Discharge status: Home / Self Care | Payer: Medicaid Other | Source: Ambulatory Visit | Attending: Obstetrics | Admitting: Obstetrics

## 2015-08-05 ENCOUNTER — Encounter (HOSPITAL_COMMUNITY): Payer: Self-pay | Admitting: *Deleted

## 2015-08-05 DIAGNOSIS — Z87891 Personal history of nicotine dependence: Secondary | ICD-10-CM | POA: Diagnosis not present

## 2015-08-05 DIAGNOSIS — R109 Unspecified abdominal pain: Secondary | ICD-10-CM | POA: Diagnosis present

## 2015-08-05 DIAGNOSIS — K5900 Constipation, unspecified: Secondary | ICD-10-CM | POA: Diagnosis not present

## 2015-08-05 DIAGNOSIS — Z3A30 30 weeks gestation of pregnancy: Secondary | ICD-10-CM | POA: Insufficient documentation

## 2015-08-05 DIAGNOSIS — O4703 False labor before 37 completed weeks of gestation, third trimester: Secondary | ICD-10-CM | POA: Diagnosis not present

## 2015-08-05 LAB — URINALYSIS, ROUTINE W REFLEX MICROSCOPIC
Bilirubin Urine: NEGATIVE
GLUCOSE, UA: NEGATIVE mg/dL
Ketones, ur: 15 mg/dL — AB
NITRITE: NEGATIVE
PH: 5.5 (ref 5.0–8.0)
Protein, ur: NEGATIVE mg/dL
Specific Gravity, Urine: 1.025 (ref 1.005–1.030)

## 2015-08-05 LAB — URINE MICROSCOPIC-ADD ON

## 2015-08-05 LAB — FETAL FIBRONECTIN: FETAL FIBRONECTIN: NEGATIVE

## 2015-08-05 NOTE — MAU Note (Signed)
Pt C/O lower back pain that started last night, also some abd tightness.  Felt better this morning, but is now hurting again.  Denies bleeding or LOF.

## 2015-08-05 NOTE — MAU Provider Note (Signed)
History     CSN: [id number]  Arrival date and time: 08/05/15 1604   First Provider Initiated Contact with Patient 08/05/15 1701      Chief Complaint  Patient presents with  . Abdominal Pain   HPI Amy Davies 30 y.o. [id number]  presents to MAU complaining of a pressure in her abdomen and pain in the lower back.  SHe notes she has always had pressure towards the bottom of her pelvis but it has increased over the last few days.  It now encompasses entire abdomen.  It's not pain - more discomfort.  Denies LOF, fever, vag bleeding, dysuria.  Notes good fetal movement.   OB History    Gravida Para Term Preterm AB TAB SAB Ectopic Multiple Living   0 0 0 2      Past Medical History  Diagnosis Date  . Irritable bowel syndrome     Past Surgical History  Procedure Laterality Date  . Induced abortion    . No past surgeries      Family History  Problem Relation Age of Onset  . Asthma Neg Hx   . Diabetes Neg Hx   . Heart disease Neg Hx   . Hypertension Neg Hx   . Kidney disease Neg Hx   . Stroke Neg Hx   . Hearing loss Neg Hx     Social History  Substance Use Topics  . Smoking status: Former Smoker    Types: Cigarettes  . Smokeless tobacco: Never Used     Comment: July 2016  . Alcohol Use: No     Comment: socially; not since +preg    Allergies: No Known Allergies  Prescriptions prior to admission  Medication Sig Dispense Refill Last Dose  . acetaminophen (TYLENOL) 325 MG tablet Take 650 mg by mouth every 6 (six) hours as needed for headache.   08/04/2015 at Unknown time  . docusate sodium (COLACE) 250 MG capsule Take 1 capsule (250 mg total) by mouth 2 (two) times daily. 60 capsule 11 Past Week at Unknown time  . polyethylene glycol powder (GLYCOLAX/MIRALAX) powder Take 17 grams/day. 500 g 4 08/04/2015 at Unknown time  . Prenat-Fe Poly-Methfol-FA-DHA (VITAFOL ULTRA) 29-0.6-0.4-200 MG CAPS Take 1 tablet by mouth daily at 10 pm. 30 capsule 12 08/05/2015 at  Unknown time  . senna (SENOKOT) 8.6 MG TABS tablet Take 1 tablet by mouth daily as needed for mild constipation.   Past Week at Unknown time  . butalbital-acetaminophen-caffeine (FIORICET) 50-325-40 MG per tablet Take 1-2 tablets by mouth every 6 (six) hours as needed for headache. (Patient not taking: Reported on 07/17/2015) 45 tablet 4 Not Taking at Unknown time  . clindamycin (CLEOCIN) 300 MG capsule Take 1 capsule (300 mg total) by mouth 2 (two) times daily. 14 capsule 0 08/03/2015  . fluconazole (DIFLUCAN) 100 MG tablet Take 1 tablet (100 mg total) by mouth once. Repeat dose in 48-72 hour. (Patient not taking: Reported on 08/05/2015) 3 tablet 0 Completed Course at Unknown time  . hydrocortisone (ANUSOL-HC) 25 MG suppository Place 1 suppository (25 mg total) rectally 2 (two) times daily. (Patient not taking: Reported on 08/05/2015) 12 suppository 4 Completed Course at Unknown time  . hydrocortisone cream 1 % Apply 1 application topically 2 (two) times daily. (Patient not taking: Reported on 07/17/2015) 30 g 0 Completed Course at Unknown time  . ondansetron (ZOFRAN) 4 MG tablet Take 1 tablet (4 mg total) by mouth daily as needed. (Patient not  taking: Reported on 07/24/2015) 30 tablet 1 Not Taking at Unknown time  . terconazole (TERAZOL 7) 0.4 % vaginal cream Place 1 applicator vaginally at bedtime. (Patient not taking: Reported on 08/05/2015) 45 g 0 Completed Course at Unknown time    ROS Pertinent ROS in HPI.  All other systems are negative.   Physical Exam   Blood pressure 115/66, pulse 99, temperature 97.9 F (36.6 C), temperature source Oral, resp. rate 18, last menstrual period 01/05/2015.  Physical Exam  Constitutional: She is oriented to person, place, and time. She appears well-developed and well-nourished. No distress.  HENT:  Head: Normocephalic and atraumatic.  Eyes: Conjunctivae and EOM are normal.  Neck: Normal range of motion. Neck supple.  Cardiovascular: Normal rate.   Respiratory:  Effort normal. No respiratory distress.  GI: Soft. She exhibits no distension. There is no tenderness.  Genitourinary:  Cervix 1cm, thick.    Musculoskeletal: Normal range of motion.  Neurological: She is alert and oriented to person, place, and time.  Skin: Skin is warm and dry.  Psychiatric: She has a normal mood and affect. Her behavior is normal.   Fetal Tracing: Baseline:135 Variability:mod Accelerations: 15x15 Decelerations:none Toco:irreg   MAU Course  Procedures  MDM FFN negative U/A with no obvious dehydration/infection No active labor  Assessment and Plan  A:  1. Threatened preterm labor, third trimester   2. Constipation, unspecified constipation type    P: Discharge to home Hydrate well F/u in clinic as directed Ob urine culture pending PTL precautions Continue meds for constipation Patient may return to MAU as needed or if her condition were to change or worsen   Bertram Denver 08/05/2015, 5:02 PM

## 2015-08-05 NOTE — Discharge Instructions (Signed)
Braxton Hicks Contractions Contractions of the uterus can occur throughout pregnancy. Contractions are not always a sign that you are in labor.  WHAT ARE BRAXTON HICKS CONTRACTIONS?  Contractions that occur before labor are called Braxton Hicks contractions, or false labor. Toward the end of pregnancy (32-34 weeks), these contractions can develop more often and may become more forceful. This is not true labor because these contractions do not result in opening (dilatation) and thinning of the cervix. They are sometimes difficult to tell apart from true labor because these contractions can be forceful and people have different pain tolerances. You should not feel embarrassed if you go to the hospital with false labor. Sometimes, the only way to tell if you are in true labor is for your health care provider to look for changes in the cervix. If there are no prenatal problems or other health problems associated with the pregnancy, it is completely safe to be sent home with false labor and await the onset of true labor. HOW CAN YOU TELL THE DIFFERENCE BETWEEN TRUE AND FALSE LABOR? False Labor  The contractions of false labor are usually shorter and not as hard as those of true labor.   The contractions are usually irregular.   The contractions are often felt in the front of the lower abdomen and in the groin.   The contractions may go away when you walk around or change positions while lying down.   The contractions get weaker and are shorter lasting as time goes on.   The contractions do not usually become progressively stronger, regular, and closer together as with true labor.  True Labor  Contractions in true labor last 30-70 seconds, become very regular, usually become more intense, and increase in frequency.   The contractions do not go away with walking.   The discomfort is usually felt in the top of the uterus and spreads to the lower abdomen and low back.   True labor can be  determined by your health care provider with an exam. This will show that the cervix is dilating and getting thinner.  WHAT TO REMEMBER  Keep up with your usual exercises and follow other instructions given by your health care provider.   Take medicines as directed by your health care provider.   Keep your regular prenatal appointments.   Eat and drink lightly if you think you are going into labor.   If Braxton Hicks contractions are making you uncomfortable:   Change your position from lying down or resting to walking, or from walking to resting.   Sit and rest in a tub of warm water.   Drink 2-3 glasses of water. Dehydration may cause these contractions.   Do slow and deep breathing several times an hour.  WHEN SHOULD I SEEK IMMEDIATE MEDICAL CARE? Seek immediate medical care if:  Your contractions become stronger, more regular, and closer together.   You have fluid leaking or gushing from your vagina.   You have a fever.   You pass blood-tinged mucus.   You have vaginal bleeding.   You have continuous abdominal pain.   You have low back pain that you never had before.   You feel your baby's head pushing down and causing pelvic pressure.   Your baby is not moving as much as it used to.    This information is not intended to replace advice given to you by your health care provider. Make sure you discuss any questions you have with your health care  provider.   Document Released: 06/30/2005 Document Revised: 07/05/2013 Document Reviewed: 04/11/2013 Elsevier Interactive Patient Education 2016 [employer].  Constipation, Adult Constipation is when a person has fewer than three bowel movements a week, has difficulty having a bowel movement, or has stools that are dry, hard, or larger than normal. As people grow older, constipation is more common. A low-fiber diet, not taking in enough fluids, and taking certain medicines may make constipation worse.    CAUSES   Certain medicines, such as antidepressants, pain medicine, iron supplements, antacids, and water pills.   Certain diseases, such as diabetes, irritable bowel syndrome (IBS), thyroid disease, or depression.   Not drinking enough water.   Not eating enough fiber-rich foods.   Stress or travel.   Lack of physical activity or exercise.   Ignoring the urge to have a bowel movement.   Using laxatives too much.  SIGNS AND SYMPTOMS   Having fewer than three bowel movements a week.   Straining to have a bowel movement.   Having stools that are hard, dry, or larger than normal.   Feeling full or bloated.   Pain in the lower abdomen.   Not feeling relief after having a bowel movement.  DIAGNOSIS  Your health care provider will take a medical history and perform a physical exam. Further testing may be done for severe constipation. Some tests may include:  A barium enema X-ray to examine your rectum, colon, and, sometimes, your small intestine.   A sigmoidoscopy to examine your lower colon.   A colonoscopy to examine your entire colon. TREATMENT  Treatment will depend on the severity of your constipation and what is causing it. Some dietary treatments include drinking more fluids and eating more fiber-rich foods. Lifestyle treatments may include regular exercise. If these diet and lifestyle recommendations do not help, your health care provider may recommend taking over-the-counter laxative medicines to help you have bowel movements. Prescription medicines may be prescribed if over-the-counter medicines do not work.  HOME CARE INSTRUCTIONS   Eat foods that have a lot of fiber, such as fruits, vegetables, whole grains, and beans.  Limit foods high in fat and processed sugars, such as french fries, hamburgers, cookies, candies, and soda.   A fiber supplement may be added to your diet if you cannot get enough fiber from foods.   Drink enough fluids to keep  your urine clear or pale yellow.   Exercise regularly or as directed by your health care provider.   Go to the restroom when you have the urge to go. Do not hold it.   Only take over-the-counter or prescription medicines as directed by your health care provider. Do not take other medicines for constipation without talking to your health care provider first.  SEEK IMMEDIATE MEDICAL CARE IF:   You have bright red blood in your stool.   Your constipation lasts for more than 4 days or gets worse.   You have abdominal or rectal pain.   You have thin, pencil-like stools.   You have unexplained weight loss. MAKE SURE YOU:   Understand these instructions.  Will watch your condition.  Will get help right away if you are not doing well or get worse.   This information is not intended to replace advice given to you by your health care provider. Make sure you discuss any questions you have with your health care provider.   Document Released: 03/28/2004 Document Revised: 07/21/2014 Document Reviewed: 04/11/2013 Elsevier Interactive Patient Education 2016 Elsevier  Inc. ° °

## 2015-08-07 ENCOUNTER — Encounter: Payer: Medicaid Other | Admitting: Certified Nurse Midwife

## 2015-08-07 ENCOUNTER — Ambulatory Visit (INDEPENDENT_AMBULATORY_CARE_PROVIDER_SITE_OTHER): Payer: Medicaid Other | Admitting: Certified Nurse Midwife

## 2015-08-07 VITALS — BP 107/71 | HR 98 | Wt 189.0 lb

## 2015-08-07 DIAGNOSIS — K5901 Slow transit constipation: Secondary | ICD-10-CM

## 2015-08-07 DIAGNOSIS — Z3483 Encounter for supervision of other normal pregnancy, third trimester: Secondary | ICD-10-CM

## 2015-08-07 LAB — CULTURE, OB URINE

## 2015-08-07 LAB — POCT URINALYSIS DIPSTICK
BILIRUBIN UA: NEGATIVE
Blood, UA: NEGATIVE
Glucose, UA: NEGATIVE
KETONES UA: NEGATIVE
LEUKOCYTES UA: NEGATIVE
NITRITE UA: NEGATIVE
PH UA: 6
PROTEIN UA: NEGATIVE
Spec Grav, UA: 1.015
Urobilinogen, UA: NEGATIVE

## 2015-08-07 MED ORDER — SENNA 8.6 MG PO TABS: 1.0000 | ORAL_TABLET | Freq: Two times a day (BID) | ORAL | Status: DC | PRN

## 2015-08-07 NOTE — Progress Notes (Signed)
Pt is having some severe constipation.  Was seen at [facility] this week for problem, enema recommended with some relief.

## 2015-08-07 NOTE — Progress Notes (Signed)
Subjective:    Amy Davies is a 30 y.o. female being seen today for her obstetrical visit. She is at [redacted]w[redacted]d gestation. Patient reports no bleeding, no contractions, no cramping, no leaking and constipation.  Homeopathic methods discussed for constipation.  Given ok for hair dye.  Fetal movement: normal.  Problem List Items Addressed This Visit    None    Visit Diagnoses    Encounter for supervision of other normal pregnancy in third trimester    -  Primary    Relevant Orders    POCT urinalysis dipstick (Completed)    Slow transit constipation        Relevant Medications    senna (SENOKOT) 8.6 MG TABS tablet      Patient Active Problem List   Diagnosis Date Noted  . Supervision of other normal pregnancy, antepartum 03/22/2015   Objective:    BP 107/71 mmHg  Pulse 98  Wt 189 lb (85.73 kg)  LMP 01/05/2015 FHT:  145 BPM  Uterine Size: size equals dates  Presentation: cephalic     Assessment:    Pregnancy @ [redacted]w[redacted]d weeks   Constipation  Plan:     labs reviewed, problem list updated Consent signed. GBS planning TDAP offered  Rhogam given for RH negative Pediatrician: discussed. Infant feeding: plans to breastfeed. Maternity leave: discussed. Cigarette smoking: never smoked. Orders Placed This Encounter  Procedures  . POCT urinalysis dipstick   Meds ordered this encounter  Medications  . senna (SENOKOT) 8.6 MG TABS tablet    Sig: Take 1 tablet (8.6 mg total) by mouth 2 (two) times daily as needed for mild constipation.    Dispense:  120 each    Refill:  2   Follow up in 2 Weeks.

## 2015-08-09 ENCOUNTER — Encounter (HOSPITAL_COMMUNITY): Payer: Self-pay

## 2015-08-09 ENCOUNTER — Ambulatory Visit (HOSPITAL_COMMUNITY)
Admission: RE | Admit: 2015-08-09 | Discharge: 2015-08-09 | Disposition: A | Discharge status: Home / Self Care | Payer: Medicaid Other | Source: Ambulatory Visit | Attending: Certified Nurse Midwife | Admitting: Certified Nurse Midwife

## 2015-08-09 VITALS — BP 112/76 | HR 96 | Wt 189.4 lb

## 2015-08-09 DIAGNOSIS — Z3A30 30 weeks gestation of pregnancy: Secondary | ICD-10-CM | POA: Diagnosis not present

## 2015-08-09 DIAGNOSIS — O0993 Supervision of high risk pregnancy, unspecified, third trimester: Secondary | ICD-10-CM

## 2015-08-09 DIAGNOSIS — O358XX0 Maternal care for other (suspected) fetal abnormality and damage, not applicable or unspecified: Secondary | ICD-10-CM | POA: Diagnosis not present

## 2015-08-09 DIAGNOSIS — O43123 Velamentous insertion of umbilical cord, third trimester: Secondary | ICD-10-CM | POA: Diagnosis not present

## 2015-08-09 DIAGNOSIS — O43193 Other malformation of placenta, third trimester: Secondary | ICD-10-CM | POA: Insufficient documentation

## 2015-08-09 DIAGNOSIS — O43129 Velamentous insertion of umbilical cord, unspecified trimester: Secondary | ICD-10-CM

## 2015-08-10 ENCOUNTER — Other Ambulatory Visit: Payer: Self-pay | Admitting: Certified Nurse Midwife

## 2015-08-14 ENCOUNTER — Ambulatory Visit (HOSPITAL_COMMUNITY): Payer: Medicaid Other

## 2015-08-15 ENCOUNTER — Ambulatory Visit (HOSPITAL_COMMUNITY)
Admission: RE | Admit: 2015-08-15 | Discharge: 2015-08-15 | Disposition: A | Discharge status: Home / Self Care | Payer: Medicaid Other | Source: Ambulatory Visit | Attending: Certified Nurse Midwife | Admitting: Certified Nurse Midwife

## 2015-08-15 ENCOUNTER — Other Ambulatory Visit (HOSPITAL_COMMUNITY): Payer: Self-pay | Admitting: Maternal and Fetal Medicine

## 2015-08-15 DIAGNOSIS — O43129 Velamentous insertion of umbilical cord, unspecified trimester: Secondary | ICD-10-CM | POA: Diagnosis not present

## 2015-08-15 DIAGNOSIS — O43123 Velamentous insertion of umbilical cord, third trimester: Secondary | ICD-10-CM

## 2015-08-15 DIAGNOSIS — Z3A31 31 weeks gestation of pregnancy: Secondary | ICD-10-CM

## 2015-08-15 DIAGNOSIS — O4100X0 Oligohydramnios, unspecified trimester, not applicable or unspecified: Secondary | ICD-10-CM | POA: Insufficient documentation

## 2015-08-16 ENCOUNTER — Other Ambulatory Visit: Payer: Self-pay | Admitting: Certified Nurse Midwife

## 2015-08-21 ENCOUNTER — Ambulatory Visit (INDEPENDENT_AMBULATORY_CARE_PROVIDER_SITE_OTHER): Payer: Medicaid Other | Admitting: Certified Nurse Midwife

## 2015-08-21 ENCOUNTER — Encounter: Payer: Self-pay | Admitting: *Deleted

## 2015-08-21 VITALS — BP 105/70 | HR 96 | Temp 98.6°F | Wt 192.0 lb

## 2015-08-21 DIAGNOSIS — N760 Acute vaginitis: Secondary | ICD-10-CM

## 2015-08-21 DIAGNOSIS — Z3483 Encounter for supervision of other normal pregnancy, third trimester: Secondary | ICD-10-CM

## 2015-08-21 LAB — POCT URINALYSIS DIPSTICK
Bilirubin, UA: NEGATIVE
Blood, UA: NEGATIVE
Glucose, UA: NEGATIVE
KETONES UA: NEGATIVE
LEUKOCYTES UA: NEGATIVE
Nitrite, UA: NEGATIVE
PROTEIN UA: NEGATIVE
Spec Grav, UA: 1.02
Urobilinogen, UA: NEGATIVE
pH, UA: 5

## 2015-08-21 MED ORDER — FLUCONAZOLE 100 MG PO TABS: 100.0000 mg | ORAL_TABLET | Freq: Once | ORAL | Status: DC

## 2015-08-21 NOTE — Progress Notes (Signed)
Subjective:    Amy Davies is a 30 y.o. female being seen today for her obstetrical visit. She is at [redacted]w[redacted]d gestation. Patient reports no bleeding, no contractions, no cramping, no leaking and vaginal irritation, had tried OTC Monistat, reports change in vaginal discharge for 1-2 weeks, declines any itching or odor. Fetal movement: normal.  Problem List Items Addressed This Visit    None     Patient Active Problem List   Diagnosis Date Noted  . Supervision of other normal pregnancy, antepartum 03/22/2015   Objective:    BP 105/70 mmHg  Pulse 96  Temp(Src) 98.6 F (37 C)  Wt 192 lb (87.091 kg)  LMP 01/05/2015 FHT:  135 BPM  Uterine Size: size equals dates  Presentation: unsure   Vaginal: + white chunky discharge, cervix: long, thick, closed and closed  Assessment:    Pregnancy @ [redacted]w[redacted]d weeks   Velamentous cord insertion, hx of frank breech position Plan:     labs reviewed, problem list updated Consent signed. GBS planing TDAP offered  Rhogam given for RH negative Pediatrician: discussed. Infant feeding: plans to breastfeed. Maternity leave: discussed. Cigarette smoking: never smoked. No orders of the defined types were placed in this encounter.   No orders of the defined types were placed in this encounter.   Follow up in 2 Weeks.

## 2015-08-21 NOTE — Addendum Note (Signed)
Addended by: Henriette Combs on: 08/21/2015 12:15 PM   Modules accepted: Orders

## 2015-08-24 LAB — SURESWAB, VAGINOSIS/VAGINITIS PLUS
Atopobium vaginae: NOT DETECTED Log (cells/mL)
C. GLABRATA, DNA: NOT DETECTED
C. albicans, DNA: NOT DETECTED
C. parapsilosis, DNA: NOT DETECTED
C. trachomatis RNA, TMA: NOT DETECTED
C. tropicalis, DNA: NOT DETECTED
Gardnerella vaginalis: NOT DETECTED Log (cells/mL)
LACTOBACILLUS SPECIES: 7 Log (cells/mL)
MEGASPHAERA SPECIES: NOT DETECTED Log (cells/mL)
N. GONORRHOEAE RNA, TMA: NOT DETECTED
T. vaginalis RNA, QL TMA: NOT DETECTED

## 2015-08-27 ENCOUNTER — Encounter (HOSPITAL_COMMUNITY): Payer: Self-pay | Admitting: *Deleted

## 2015-08-27 ENCOUNTER — Inpatient Hospital Stay (HOSPITAL_COMMUNITY)
Admission: AD | Admit: 2015-08-27 | Discharge: 2015-08-27 | Disposition: A | Discharge status: Home / Self Care | Payer: Medicaid Other | Source: Ambulatory Visit | Attending: Obstetrics | Admitting: Obstetrics

## 2015-08-27 DIAGNOSIS — O479 False labor, unspecified: Secondary | ICD-10-CM

## 2015-08-27 DIAGNOSIS — O4703 False labor before 37 completed weeks of gestation, third trimester: Secondary | ICD-10-CM

## 2015-08-27 DIAGNOSIS — Z87891 Personal history of nicotine dependence: Secondary | ICD-10-CM | POA: Insufficient documentation

## 2015-08-27 DIAGNOSIS — Z3A33 33 weeks gestation of pregnancy: Secondary | ICD-10-CM | POA: Insufficient documentation

## 2015-08-27 LAB — URINALYSIS, ROUTINE W REFLEX MICROSCOPIC
BILIRUBIN URINE: NEGATIVE
Glucose, UA: NEGATIVE mg/dL
Ketones, ur: NEGATIVE mg/dL
Leukocytes, UA: NEGATIVE
Nitrite: NEGATIVE
PROTEIN: NEGATIVE mg/dL
Specific Gravity, Urine: 1.01 (ref 1.005–1.030)
pH: 7 (ref 5.0–8.0)

## 2015-08-27 LAB — URINE MICROSCOPIC-ADD ON

## 2015-08-27 MED ORDER — ACETAMINOPHEN 500 MG PO TABS
1000.0000 mg | ORAL_TABLET | Freq: Once | ORAL | Status: AC
  Administered 2015-08-27: 1000 mg via ORAL
  Filled 2015-08-27: qty 2

## 2015-08-27 MED ORDER — NIFEDIPINE 10 MG PO CAPS
10.0000 mg | ORAL_CAPSULE | Freq: Once | ORAL | Status: AC
  Administered 2015-08-27: 10 mg via ORAL
  Filled 2015-08-27: qty 1

## 2015-08-27 NOTE — Discharge Instructions (Signed)
Braxton Hicks Contractions °Contractions of the uterus can occur throughout pregnancy. Contractions are not always a sign that you are in labor.  °WHAT ARE BRAXTON HICKS CONTRACTIONS?  °Contractions that occur before labor are called Braxton Hicks contractions, or false labor. Toward the end of pregnancy (32-34 weeks), these contractions can develop more often and may become more forceful. This is not true labor because these contractions do not result in opening (dilatation) and thinning of the cervix. They are sometimes difficult to tell apart from true labor because these contractions can be forceful and people have different pain tolerances. You should not feel embarrassed if you go to the hospital with false labor. Sometimes, the only way to tell if you are in true labor is for your health care provider to look for changes in the cervix. °If there are no prenatal problems or other health problems associated with the pregnancy, it is completely safe to be sent home with false labor and await the onset of true labor. °HOW CAN YOU TELL THE DIFFERENCE BETWEEN TRUE AND FALSE LABOR? °False Labor °· The contractions of false labor are usually shorter and not as hard as those of true labor.   °· The contractions are usually irregular.   °· The contractions are often felt in the front of the lower abdomen and in the groin.   °· The contractions may go away when you walk around or change positions while lying down.   °· The contractions get weaker and are shorter lasting as time goes on.   °· The contractions do not usually become progressively stronger, regular, and closer together as with true labor.   °True Labor °· Contractions in true labor last 30-70 seconds, become very regular, usually become more intense, and increase in frequency.   °· The contractions do not go away with walking.   °· The discomfort is usually felt in the top of the uterus and spreads to the lower abdomen and low back.   °· True labor can be  determined by your health care provider with an exam. This will show that the cervix is dilating and getting thinner.   °WHAT TO REMEMBER °· Keep up with your usual exercises and follow other instructions given by your health care provider.   °· Take medicines as directed by your health care provider.   °· Keep your regular prenatal appointments.   °· Eat and drink lightly if you think you are going into labor.   °· If Braxton Hicks contractions are making you uncomfortable:   °¨ Change your position from lying down or resting to walking, or from walking to resting.   °¨ Sit and rest in a tub of warm water.   °¨ Drink 2-3 glasses of water. Dehydration may cause these contractions.   °¨ Do slow and deep breathing several times an hour.   °WHEN SHOULD I SEEK IMMEDIATE MEDICAL CARE? °Seek immediate medical care if: °· Your contractions become stronger, more regular, and closer together.   °· You have fluid leaking or gushing from your vagina.   °· You have a fever.   °· You pass blood-tinged mucus.   °· You have vaginal bleeding.   °· You have continuous abdominal pain.   °· You have low back pain that you never had before.   °· You feel your baby's head pushing down and causing pelvic pressure.   °· Your baby is not moving as much as it used to.   °  °This information is not intended to replace advice given to you by your health care provider. Make sure you discuss any questions you have with your health care   provider. °  °Document Released: 06/30/2005 Document Revised: 07/05/2013 Document Reviewed: 04/11/2013 °Elsevier Interactive Patient Education ©2016 Elsevier Inc. ° °

## 2015-08-27 NOTE — MAU Note (Signed)
Been having pains all weekend, comes and goes.  abd gets tight. No bleeding or leaking. No hx of PTL

## 2015-08-27 NOTE — MAU Provider Note (Signed)
History     CSN: [id number]  Arrival date and time: 08/27/15 1749   First Provider Initiated Contact with Patient 08/27/15 1912      Chief Complaint  Patient presents with  . Contractions   HPI   [name] is a 30 y.o. female [phone] at [redacted]w[redacted]d presenting to MAU with contractions that started over the weekend. She is feeling the contractions every day. The contractions feel "uncomfortable due to the tightness".  She is feeling 5 contractions an hour.   When she has a contraction she rates her pain 7/10  She denies vaginal bleeding or leaking of fluid.  + fetal movement.   She has had intercourse in the last 24 hours.   OB History    Gravida Para Term Preterm AB TAB SAB Ectopic Multiple Living   0 0 0 2      Past Medical History  Diagnosis Date  . Irritable bowel syndrome     Past Surgical History  Procedure Laterality Date  . Induced abortion    . No past surgeries      Family History  Problem Relation Age of Onset  . Asthma Neg Hx   . Diabetes Neg Hx   . Heart disease Neg Hx   . Hypertension Neg Hx   . Kidney disease Neg Hx   . Stroke Neg Hx   . Hearing loss Neg Hx     Social History  Substance Use Topics  . Smoking status: Former Smoker    Types: Cigarettes  . Smokeless tobacco: Never Used     Comment: July 2016  . Alcohol Use: No     Comment: socially; not since +preg    Allergies: No Known Allergies  Prescriptions prior to admission  Medication Sig Dispense Refill Last Dose  . acetaminophen (TYLENOL) 325 MG tablet Take 650 mg by mouth every 6 (six) hours as needed for headache.   08/26/2015 at Unknown time  . docusate sodium (COLACE) 250 MG capsule Take 1 capsule (250 mg total) by mouth 2 (two) times daily. 60 capsule 11 Past Week at Unknown time  . polyethylene glycol powder (GLYCOLAX/MIRALAX) powder Take 17 grams/day. 500 g 4 Past Week at Unknown time  . Prenat-Fe Poly-Methfol-FA-DHA (VITAFOL ULTRA) 29-0.6-0.4-200 MG CAPS  Take 1 tablet by mouth daily at 10 pm. 30 capsule 12 08/26/2015 at Unknown time  . fluconazole (DIFLUCAN) 100 MG tablet Take 1 tablet (100 mg total) by mouth once. Repeat dose in 48-72 hour. (Patient not taking: Reported on 08/27/2015) 3 tablet 0   . senna (SENOKOT) 8.6 MG TABS tablet Take 1 tablet (8.6 mg total) by mouth 2 (two) times daily as needed for mild constipation. (Patient not taking: Reported on 08/09/2015) 120 each 2 Not Taking   Results for orders placed or performed during the hospital encounter of 08/27/15 (from the past 48 hour(s))  Urinalysis, Routine w reflex microscopic (not at [facility])     Status: Abnormal   Collection Time: 08/27/15  6:05 PM  Result Value Ref Range   Color, Urine YELLOW YELLOW   APPearance CLEAR CLEAR   Specific Gravity, Urine 1.010 1.005 - 1.030   pH 7.0 5.0 - 8.0   Glucose, UA NEGATIVE NEGATIVE mg/dL   Hgb urine dipstick TRACE (A) NEGATIVE   Bilirubin Urine NEGATIVE NEGATIVE   Ketones, ur NEGATIVE NEGATIVE mg/dL   Protein, ur NEGATIVE NEGATIVE mg/dL   Nitrite NEGATIVE NEGATIVE   Leukocytes, UA NEGATIVE NEGATIVE  Urine microscopic-add on     Status: Abnormal   Collection Time: 08/27/15  6:05 PM  Result Value Ref Range   Squamous Epithelial / LPF 0-5 (A) NONE SEEN   WBC, UA 0-5 0 - 5 WBC/hpf   RBC / HPF 0-5 0 - 5 RBC/hpf   Bacteria, UA RARE (A) NONE SEEN    Review of Systems  Constitutional: Negative for fever and chills.  Gastrointestinal: Negative for nausea and vomiting.  Musculoskeletal: Positive for back pain.   Physical Exam   Blood pressure 118/69, pulse 88, temperature 97.5 F (36.4 C), temperature source Oral, resp. rate 18, weight 194 lb 6.4 oz (88.179 kg), last menstrual period 01/05/2015.  Physical Exam  Constitutional: She is oriented to person, place, and time. She appears well-developed and well-nourished. No distress.  HENT:  Head: Normocephalic.  Eyes: Pupils are equal, round, and reactive to light.  GI: Soft. She exhibits no  distension. There is no tenderness. There is no rebound.  Genitourinary:  Cervix: closed, thick, posterior.   Musculoskeletal: Normal range of motion.  Neurological: She is alert and oriented to person, place, and time.  Skin: Skin is warm. She is not diaphoretic.  Psychiatric: Her behavior is normal.   Fetal Tracing: Baseline: 135 bpm  Variability: Moderate  Accelerations: 15x15 Decelerations: none Toco: 4 contractions with UI.    MAU Course  Procedures  None  MDM  Procardia 10 mg PO Tylenol 1 gram PO   Patient much better after procardia and tylenol.   Assessment and Plan    A:  1. Braxton Hicks contractions     P:  Discharge home in stable condition Return to MAU if symptoms worsen Preterm labor precautions  Call Dr. Clearance Coots tomorrow for follow up.  Kick counts     Amy Lope, NP 08/27/2015 8:16 PM

## 2015-08-27 NOTE — MAU Note (Signed)
Pt states that after dose of procardia and tylenol pain is much better

## 2015-08-29 ENCOUNTER — Telehealth: Payer: Self-pay | Admitting: *Deleted

## 2015-08-29 NOTE — Telephone Encounter (Signed)
Patient was seen in MAU. Patient called to see when she should follow up in the office. Patient states she is still having constipation with little relief. Per Dr.Harper patient to keep her appointment next week. Patient to see him instead of Rachelle next week. Will discuss constipation at that appointment. Patient verbalized understanding of the plan.

## 2015-08-31 ENCOUNTER — Encounter (HOSPITAL_COMMUNITY): Payer: Self-pay

## 2015-08-31 ENCOUNTER — Ambulatory Visit (HOSPITAL_COMMUNITY)
Admission: RE | Admit: 2015-08-31 | Discharge: 2015-08-31 | Disposition: A | Discharge status: Home / Self Care | Payer: Medicaid Other | Source: Ambulatory Visit | Attending: Certified Nurse Midwife | Admitting: Certified Nurse Midwife

## 2015-08-31 ENCOUNTER — Ambulatory Visit (HOSPITAL_COMMUNITY): Payer: Medicaid Other

## 2015-08-31 DIAGNOSIS — O321XX0 Maternal care for breech presentation, not applicable or unspecified: Secondary | ICD-10-CM | POA: Diagnosis not present

## 2015-08-31 DIAGNOSIS — O43123 Velamentous insertion of umbilical cord, third trimester: Secondary | ICD-10-CM | POA: Diagnosis not present

## 2015-08-31 DIAGNOSIS — Z3A34 34 weeks gestation of pregnancy: Secondary | ICD-10-CM | POA: Diagnosis not present

## 2015-08-31 DIAGNOSIS — O43129 Velamentous insertion of umbilical cord, unspecified trimester: Secondary | ICD-10-CM

## 2015-09-03 ENCOUNTER — Encounter (HOSPITAL_COMMUNITY): Payer: Self-pay | Admitting: *Deleted

## 2015-09-03 ENCOUNTER — Inpatient Hospital Stay (EMERGENCY_DEPARTMENT_HOSPITAL)
Admission: AD | Admit: 2015-09-03 | Discharge: 2015-09-03 | Disposition: A | Discharge status: Home / Self Care | Payer: Medicaid Other | Source: Ambulatory Visit | Attending: Obstetrics | Admitting: Obstetrics

## 2015-09-03 ENCOUNTER — Inpatient Hospital Stay (HOSPITAL_COMMUNITY)
Admission: AD | Admit: 2015-09-03 | Discharge: 2015-09-09 | DRG: 766 | Disposition: A | Discharge status: Home / Self Care | Payer: Medicaid Other | Source: Ambulatory Visit | Attending: Obstetrics | Admitting: Obstetrics

## 2015-09-03 DIAGNOSIS — Z3689 Encounter for other specified antenatal screening: Secondary | ICD-10-CM

## 2015-09-03 DIAGNOSIS — R109 Unspecified abdominal pain: Secondary | ICD-10-CM | POA: Diagnosis not present

## 2015-09-03 DIAGNOSIS — O43122 Velamentous insertion of umbilical cord, second trimester: Secondary | ICD-10-CM

## 2015-09-03 DIAGNOSIS — Z3490 Encounter for supervision of normal pregnancy, unspecified, unspecified trimester: Secondary | ICD-10-CM

## 2015-09-03 DIAGNOSIS — O321XX0 Maternal care for breech presentation, not applicable or unspecified: Secondary | ICD-10-CM | POA: Diagnosis present

## 2015-09-03 DIAGNOSIS — O42913 Preterm premature rupture of membranes, unspecified as to length of time between rupture and onset of labor, third trimester: Principal | ICD-10-CM | POA: Diagnosis present

## 2015-09-03 DIAGNOSIS — Z98891 History of uterine scar from previous surgery: Secondary | ICD-10-CM

## 2015-09-03 DIAGNOSIS — Z3A34 34 weeks gestation of pregnancy: Secondary | ICD-10-CM

## 2015-09-03 DIAGNOSIS — O328XX0 Maternal care for other malpresentation of fetus, not applicable or unspecified: Secondary | ICD-10-CM

## 2015-09-03 DIAGNOSIS — O26899 Other specified pregnancy related conditions, unspecified trimester: Secondary | ICD-10-CM

## 2015-09-03 DIAGNOSIS — O4290 Premature rupture of membranes, unspecified as to length of time between rupture and onset of labor, unspecified weeks of gestation: Secondary | ICD-10-CM | POA: Diagnosis present

## 2015-09-03 DIAGNOSIS — R32 Unspecified urinary incontinence: Secondary | ICD-10-CM

## 2015-09-03 DIAGNOSIS — O9989 Other specified diseases and conditions complicating pregnancy, childbirth and the puerperium: Secondary | ICD-10-CM

## 2015-09-03 DIAGNOSIS — O9902 Anemia complicating childbirth: Secondary | ICD-10-CM | POA: Diagnosis present

## 2015-09-03 LAB — URINE MICROSCOPIC-ADD ON

## 2015-09-03 LAB — URINALYSIS, ROUTINE W REFLEX MICROSCOPIC
BILIRUBIN URINE: NEGATIVE
GLUCOSE, UA: NEGATIVE mg/dL
KETONES UR: 15 mg/dL — AB
Leukocytes, UA: NEGATIVE
Nitrite: NEGATIVE
PH: 6 (ref 5.0–8.0)
Protein, ur: NEGATIVE mg/dL
SPECIFIC GRAVITY, URINE: 1.02 (ref 1.005–1.030)

## 2015-09-03 LAB — AMNISURE RUPTURE OF MEMBRANE (ROM) NOT AT ARMC: Amnisure ROM: NEGATIVE

## 2015-09-03 NOTE — MAU Note (Signed)
Pt reports ROM at 1900, contractions all day.

## 2015-09-03 NOTE — MAU Note (Signed)
Pt c/o LOF around 10pm. Denies vag bleeding. Having contractions all day that are now every 3-5 mins. Not painful, but "tight" +FM

## 2015-09-03 NOTE — Discharge Instructions (Signed)

## 2015-09-03 NOTE — MAU Provider Note (Signed)
History   [id number] @ 34.3 wks in with c/o feeling pop and gush of fluid with more gushing after that. States has had contractions all day. States fluid is clear. Fetus was breech several days ago.  CSN: [id number]  Arrival date & time 09/03/15  1937   None     No chief complaint on file.   HPI  Past Medical History  Diagnosis Date  . Irritable bowel syndrome     Past Surgical History  Procedure Laterality Date  . Induced abortion    . No past surgeries      Family History  Problem Relation Age of Onset  . Asthma Neg Hx   . Diabetes Neg Hx   . Heart disease Neg Hx   . Hypertension Neg Hx   . Kidney disease Neg Hx   . Stroke Neg Hx   . Hearing loss Neg Hx     Social History  Substance Use Topics  . Smoking status: Former Smoker    Types: Cigarettes  . Smokeless tobacco: Never Used     Comment: July 2016  . Alcohol Use: No     Comment: socially; not since +preg    OB History    Gravida Para Term Preterm AB TAB SAB Ectopic Multiple Living   0 0 0 2      Review of Systems  Constitutional: Negative.   HENT: Negative.   Eyes: Negative.   Respiratory: Negative.   Cardiovascular: Negative.   Gastrointestinal: Positive for abdominal pain.  Endocrine: Negative.   Genitourinary: Negative.   Musculoskeletal: Negative.   Skin: Negative.   Allergic/Immunologic: Negative.   Neurological: Negative.   Hematological: Negative.   Psychiatric/Behavioral: Negative.     Allergies  Review of patient's allergies indicates no known allergies.  Home Medications  No current outpatient prescriptions on file.  BP 139/87 mmHg  Pulse 98  Temp(Src) 98.3 F (36.8 C) (Oral)  Resp 16  Ht 5' 6.5" (1.689 m)  Wt 195 lb (88.451 kg)  BMI 31.01 kg/m2  SpO2 100%  LMP 01/05/2015  Physical Exam  Constitutional: She is oriented to person, place, and time. She appears well-developed and well-nourished.  HENT:  Head: Normocephalic.  Eyes: Pupils are equal, round,  and reactive to light.  Neck: Normal range of motion.  Cardiovascular: Normal rate, regular rhythm, normal heart sounds and intact distal pulses.   Pulmonary/Chest: Effort normal and breath sounds normal.  Abdominal: Soft. Bowel sounds are normal.  Genitourinary: Vagina normal and uterus normal.  Musculoskeletal: Normal range of motion.  Neurological: She is alert and oriented to person, place, and time. She has normal reflexes.  Skin: Skin is warm and dry.  Psychiatric: She has a normal mood and affect. Her behavior is normal. Judgment and thought content normal.    MAU Course  Procedures (including critical care time)  Labs Reviewed  URINALYSIS, ROUTINE W REFLEX MICROSCOPIC (NOT AT [facility]) - Abnormal; Notable for the following:    Hgb urine dipstick SMALL (*)    Ketones, ur 15 (*)    All other components within normal limits  URINE MICROSCOPIC-ADD ON - Abnormal; Notable for the following:    Squamous Epithelial / LPF 0-5 (*)    Bacteria, UA RARE (*)    All other components within normal limits  AMNISURE RUPTURE OF MEMBRANE (ROM) NOT AT [facility]  POCT FERN TEST   No results found.   No diagnosis found.    MDM  Sterile  spec exam, vagina very wet looking amnisure obtained and neg. SVE 2-3/th/post/high, presenting part ballots with exam and no loss of fluid. No further leaking with repeat exam will d/c home. Instructed pt if any more loss of fluid to return to hospital.

## 2015-09-04 ENCOUNTER — Encounter (HOSPITAL_COMMUNITY): Payer: Self-pay

## 2015-09-04 ENCOUNTER — Encounter: Payer: Medicaid Other | Admitting: Certified Nurse Midwife

## 2015-09-04 ENCOUNTER — Inpatient Hospital Stay (HOSPITAL_COMMUNITY): Payer: Medicaid Other

## 2015-09-04 ENCOUNTER — Ambulatory Visit (HOSPITAL_COMMUNITY): Payer: Medicaid Other

## 2015-09-04 DIAGNOSIS — Z3A34 34 weeks gestation of pregnancy: Secondary | ICD-10-CM

## 2015-09-04 DIAGNOSIS — O9989 Other specified diseases and conditions complicating pregnancy, childbirth and the puerperium: Secondary | ICD-10-CM | POA: Diagnosis not present

## 2015-09-04 DIAGNOSIS — O321XX0 Maternal care for breech presentation, not applicable or unspecified: Secondary | ICD-10-CM | POA: Diagnosis present

## 2015-09-04 DIAGNOSIS — R32 Unspecified urinary incontinence: Secondary | ICD-10-CM | POA: Diagnosis not present

## 2015-09-04 DIAGNOSIS — O9902 Anemia complicating childbirth: Secondary | ICD-10-CM | POA: Diagnosis present

## 2015-09-04 DIAGNOSIS — O42913 Preterm premature rupture of membranes, unspecified as to length of time between rupture and onset of labor, third trimester: Principal | ICD-10-CM

## 2015-09-04 DIAGNOSIS — O4290 Premature rupture of membranes, unspecified as to length of time between rupture and onset of labor, unspecified weeks of gestation: Secondary | ICD-10-CM | POA: Diagnosis present

## 2015-09-04 DIAGNOSIS — R109 Unspecified abdominal pain: Secondary | ICD-10-CM | POA: Diagnosis not present

## 2015-09-04 LAB — CBC WITH DIFFERENTIAL/PLATELET
Basophils Absolute: 0 10*3/uL (ref 0.0–0.1)
Basophils Relative: 0 %
Eosinophils Absolute: 0 10*3/uL (ref 0.0–0.7)
Eosinophils Relative: 0 %
HCT: 35.8 % — ABNORMAL LOW (ref 36.0–46.0)
Hemoglobin: 12.1 g/dL (ref 12.0–15.0)
Lymphocytes Relative: 34 %
Lymphs Abs: 3.1 10*3/uL (ref 0.7–4.0)
MCH: 31.2 pg (ref 26.0–34.0)
MCHC: 33.8 g/dL (ref 30.0–36.0)
MCV: 92.3 fL (ref 78.0–100.0)
Monocytes Absolute: 0.9 10*3/uL (ref 0.1–1.0)
Monocytes Relative: 10 %
Neutro Abs: 5.2 10*3/uL (ref 1.7–7.7)
Neutrophils Relative %: 56 %
Platelets: 279 10*3/uL (ref 150–400)
RBC: 3.88 MIL/uL (ref 3.87–5.11)
RDW: 13.4 % (ref 11.5–15.5)
WBC: 9.2 10*3/uL (ref 4.0–10.5)

## 2015-09-04 LAB — TYPE AND SCREEN
ABO/RH(D): O POS
Antibody Screen: NEGATIVE

## 2015-09-04 LAB — POCT FERN TEST
POCT FERN TEST: POSITIVE
POCT Fern Test: NEGATIVE

## 2015-09-04 LAB — ABO/RH: ABO/RH(D): O POS

## 2015-09-04 MED ORDER — ERYTHROMYCIN BASE 250 MG PO TABS
250.0000 mg | ORAL_TABLET | Freq: Four times a day (QID) | ORAL | Status: DC
  Administered 2015-09-04 – 2015-09-06 (×8): 250 mg via ORAL
  Filled 2015-09-04 (×10): qty 1

## 2015-09-04 MED ORDER — LACTATED RINGERS IV SOLN
INTRAVENOUS | Status: DC
  Administered 2015-09-04: 22:00:00 via INTRAVENOUS
  Administered 2015-09-04: 100 mL/h via INTRAVENOUS
  Administered 2015-09-04: 125 mL/h via INTRAVENOUS
  Administered 2015-09-05 – 2015-09-06 (×5): via INTRAVENOUS

## 2015-09-04 MED ORDER — ZOLPIDEM TARTRATE 5 MG PO TABS: 5.0000 mg | ORAL_TABLET | Freq: Every evening | ORAL | Status: DC | PRN

## 2015-09-04 MED ORDER — SODIUM CHLORIDE 0.9 % IV SOLN
2.0000 g | Freq: Four times a day (QID) | INTRAVENOUS | Status: DC
  Administered 2015-09-04 – 2015-09-06 (×10): 2 g via INTRAVENOUS
  Filled 2015-09-04 (×11): qty 2000

## 2015-09-04 MED ORDER — DEXAMETHASONE SODIUM PHOSPHATE 10 MG/ML IJ SOLN: 10.0000 mg | Freq: Four times a day (QID) | INTRAMUSCULAR | Status: DC | PRN

## 2015-09-04 MED ORDER — ERYTHROMYCIN LACTOBIONATE 500 MG IV SOLR
500.0000 mg | Freq: Once | INTRAVENOUS | Status: AC
  Administered 2015-09-04: 500 mg via INTRAVENOUS
  Filled 2015-09-04: qty 10

## 2015-09-04 MED ORDER — MAGNESIUM SULFATE 50 % IJ SOLN
2.0000 g/h | INTRAMUSCULAR | Status: DC
  Administered 2015-09-04: 2 g/h via INTRAVENOUS
  Filled 2015-09-04 (×2): qty 80

## 2015-09-04 MED ORDER — DOCUSATE SODIUM 100 MG PO CAPS
100.0000 mg | ORAL_CAPSULE | Freq: Every day | ORAL | Status: DC
  Administered 2015-09-04 – 2015-09-05 (×2): 100 mg via ORAL
  Filled 2015-09-04 (×3): qty 1

## 2015-09-04 MED ORDER — BETAMETHASONE SOD PHOS & ACET 6 (3-3) MG/ML IJ SUSP
12.0000 mg | INTRAMUSCULAR | Status: AC
  Administered 2015-09-04 – 2015-09-05 (×2): 12 mg via INTRAMUSCULAR
  Filled 2015-09-04 (×2): qty 2

## 2015-09-04 MED ORDER — PROMETHAZINE HCL 25 MG PO TABS: 12.5000 mg | ORAL_TABLET | Freq: Four times a day (QID) | ORAL | Status: DC | PRN

## 2015-09-04 MED ORDER — CALCIUM CARBONATE ANTACID 500 MG PO CHEW
2.0000 | CHEWABLE_TABLET | ORAL | Status: DC | PRN
  Filled 2015-09-04: qty 2

## 2015-09-04 MED ORDER — MAGNESIUM SULFATE BOLUS VIA INFUSION
4.0000 g | Freq: Once | INTRAVENOUS | Status: AC
  Administered 2015-09-04: 4 g via INTRAVENOUS
  Filled 2015-09-04: qty 500

## 2015-09-04 MED ORDER — DIPHENHYDRAMINE HCL 12.5 MG/5ML PO ELIX
12.5000 mg | ORAL_SOLUTION | Freq: Four times a day (QID) | ORAL | Status: DC | PRN
  Filled 2015-09-04: qty 5

## 2015-09-04 MED ORDER — ACETAMINOPHEN 325 MG PO TABS
650.0000 mg | ORAL_TABLET | Freq: Four times a day (QID) | ORAL | Status: DC | PRN
  Administered 2015-09-04 – 2015-09-05 (×3): 650 mg via ORAL
  Filled 2015-09-04 (×3): qty 2

## 2015-09-04 MED ORDER — PRENATAL MULTIVITAMIN CH
1.0000 | ORAL_TABLET | Freq: Every day | ORAL | Status: DC
  Filled 2015-09-04: qty 1

## 2015-09-04 MED ORDER — PROMETHAZINE HCL 25 MG/ML IJ SOLN
25.0000 mg | Freq: Four times a day (QID) | INTRAMUSCULAR | Status: DC | PRN
  Administered 2015-09-04: 25 mg via INTRAMUSCULAR
  Filled 2015-09-04: qty 1

## 2015-09-04 NOTE — Progress Notes (Signed)
MFM consult, Staff Note:   ?Amy Davies  been diagnosed and admitted for preterm premature rupture of membranes as established by her obstetrical provider. I was asked to evaluate her fetus and provide recommendations regarding the management of this diagnosis. I explained to her that in order for labor to occur either preterm or term, there has to be membrane activation, uterine contractility and cervical dilation, which of these manifest primarily varies from individual to individual and frequently it is a combination of synchronous and asynchronous activations that lead to eventual preterm delivery. Any number of precipitating events in combination may be the basis or pathologic activation of any of these pathways. Regardless, I explained to her that at this point our primary concern was for ascending intrauterine/intraamniotic infection, which poses risk to her as well as her fetus. She understood that inpatient management was essential with serial examinations and fetal heart rate tracings to screen for onset of infection and that evidence of such would prompt delivery.   I explained to her timing of delivery in absence of infection and in presence of reassuring fetal status has been debated historically and recently by many experts. Regardless, I cited to her that the best available and most well-accepted timing of delivery in context of otherwise reassuring maternal-fetus status affected by pPROM is at [redacted] weeks gestational age. This most fully balances the risk of prematurity against that of an occult intrauterine infection. Occult intrauterine infection is the second leading cause of cerebral palsy as opposed to the leading cause which is, of course, prematurity. She seemed to grasp the concept as well as our specialty's rationale.   Given recent evidence, it is reasonable to pursue expectant management to complete a corticosteroid course in the late preterm period (34-36 6/7 weeks) in which  delivery is delayed until steroid complete.  Given timing of the dosing, I would state it is reasonable to pursue delivery any time after completion of 2nd dose of BMZ tonight to time delivery on 10/03/15 or 10/04/15, but I would not recommend delaying delivery beyond that time interval.     All of your patient's questions were addressed to her satisfaction today. Although this was a lot of information to take in, she demonstrated excellent comprehension of my impression, recommendations, and the underlying rationale.   Summary of Recommendations:  1. I agree with latency antibiotics course until delivery;  2. Delivery by c/s for breech 3. Timing of delivery should be anticipated by steroid completion given that she has already achieved [redacted] weeks GA provided that testing remains reassuring and there is no evidence of chorioamnionitis.  4. I would deliver prior to steroid completion for nonreassuring fetal testing, evidence of chorioamnionitis, or other evidence of maternal or fetal deterioration.  5. The patient should be monitored by serial clinical exams, serial vital signs, daily EFM's/NST's (more often if clinically warranted), CBC only as clinically indicated (noting that WBC at or above 20,000 constitutes leukocytosis for pregnancy).  6. NICU consultation is recommended.  7. SCD's for DVT prophylaxis until fully ambulatory after cesarean section.  Time Spent:  I spent in excess of 40 minutes in consultation with this patient to review records, evaluate her case, and provide her with an adequate discussion and education. More than 50% of this time was spent in direct face-to-face counseling.   It was a pleasure seeing your patient in the office today. Thank you for consultation. Please do not hesitate to contact our service for any further questions.   Thank  you,   Louann Sjogren Gaynelle Arabian, Louann Sjogren, MD, MS, FACOG  Assistant Professor  Section of Maternal-Fetal Medicine  [facility]

## 2015-09-04 NOTE — Progress Notes (Signed)
Patient ID: Amy Davies, female   DOB: Jan 25, 1986, 30 y.o.   MRN: [id number]  Hospital Day: 2  S: Preterm labor symptoms: low back pain and pelvic pressure.  Was feeling contractions earlier.    O: Blood pressure 116/62, pulse 101, temperature 98.3 F (36.8 C), temperature source Oral, resp. rate 20, height 5' 6.5" (1.689 m), weight 194 lb (87.998 kg), last menstrual period 01/05/2015, SpO2 100 %.   WUJ:WJXBJYNW: 130 bpm, Variability: Good {> 6 bpm), Accelerations: Reactive and Decelerations: Absent Toco: irregular SVE:   A/P- 30 y.o. admitted with:  PROM  Present on Admission:  . PROM (premature rupture of membranes)  Pregnancy Complications: breech presentation  Preterm labor management: pelvic rest advised, modified bedrest advised, patient transported to hospital L & D and BMZ & Magnesium Drip Dating:  [redacted]w[redacted]d PNL Needed:  none FWB:  good PTL:  stable ROD: C-Section planned for 0730 09/06/15.

## 2015-09-04 NOTE — MAU Provider Note (Signed)
History     CSN: [id number]  Arrival date and time: 09/03/15 2333   First Provider Initiated Contact with Patient 09/04/15 0004      Chief Complaint  Patient presents with  . Rupture of Membranes   HPI Amy Davies is a 30 y.o. [id number] at [redacted]w[redacted]d who presents to MAU today with complaint of LOF. She was seen in MAU earlier today with the same complaint. She states that she went home and noted another large gush, more than before. She states that the liquid went through her underwear and her pants. She denies vaginal bleeding or complications with the pregnancy. She states only occasional, irregular contractions. She reports good fetal movement. She was breech on last US performed by MFM on 08/31/15.   OB History    Gravida Para Term Preterm AB TAB SAB Ectopic Multiple Living   0 0 0 2      Past Medical History  Diagnosis Date  . Irritable bowel syndrome     Past Surgical History  Procedure Laterality Date  . Induced abortion    . No past surgeries      Family History  Problem Relation Age of Onset  . Asthma Neg Hx   . Diabetes Neg Hx   . Heart disease Neg Hx   . Hypertension Neg Hx   . Kidney disease Neg Hx   . Stroke Neg Hx   . Hearing loss Neg Hx     Social History  Substance Use Topics  . Smoking status: Former Smoker    Types: Cigarettes  . Smokeless tobacco: Never Used     Comment: July 2016  . Alcohol Use: No     Comment: socially; not since +preg    Allergies: No Known Allergies  Prescriptions prior to admission  Medication Sig Dispense Refill Last Dose  . acetaminophen (TYLENOL) 325 MG tablet Take 650 mg by mouth every 6 (six) hours as needed for headache.   09/02/2015 at Unknown time  . polyethylene glycol powder (GLYCOLAX/MIRALAX) powder Take 17 grams/day. (Patient taking differently: Take 1 Container by mouth daily. Take 17 grams/day.) 500 g 4 09/03/2015 at Unknown time  . Prenat-Fe Poly-Methfol-FA-DHA (VITAFOL ULTRA) 29-0.6-0.4-200  MG CAPS Take 1 tablet by mouth daily at 10 pm. 30 capsule 12 09/02/2015 at Unknown time    Review of Systems  Constitutional: Negative for fever and malaise/fatigue.  Gastrointestinal: Negative for nausea, vomiting, abdominal pain, diarrhea and constipation.  Genitourinary:       + LOF Neg - vaginal bleeding   Physical Exam   Blood pressure 124/68, pulse 92, temperature 98.5 F (36.9 C), temperature source Oral, resp. rate 16, height 5' 6.5" (1.689 m), weight 195 lb (88.451 kg), last menstrual period 01/05/2015, SpO2 100 %.  Physical Exam  Nursing note and vitals reviewed. Constitutional: She is oriented to person, place, and time. She appears well-developed and well-nourished. No distress.  HENT:  Head: Normocephalic and atraumatic.  Cardiovascular: Normal rate.   Respiratory: Effort normal.  GI: Soft. She exhibits no distension and no mass. There is no tenderness. There is no rebound and no guarding.  Genitourinary: Uterus is enlarged (appropriate for GA). Cervix exhibits no discharge and no friability. No bleeding in the vagina. Vaginal discharge (large amount of mucus discharge noted) found.  Neurological: She is alert and oriented to person, place, and time.  Skin: Skin is warm and dry. No erythema.  Psychiatric: She has a normal mood and  affect.     Results for orders placed or performed during the hospital encounter of 09/03/15 (from the past 24 hour(s))  Fern Test     Status: None   Collection Time: 09/04/15 12:06 AM  Result Value Ref Range   POCT Fern Test Negative = intact amniotic membranes   Fern Test     Status: None   Collection Time: 09/04/15 12:15 AM  Result Value Ref Range   POCT Fern Test Positive = ruptured amniotic membanes     Fetal Monitoring: Baseline: 130 bpm Variability: moderate Accelerations: 15 x 15 Decelerations: none Contractions: few, irregular  MAU Course  Procedures None  MDM Reviewed note and results from earlier visit Discussed  patient with Dr. Gaynell Face. He will consult with NICU prior to decision about admission vs transfer.  Per Dr. Gaynell Face, NICU is ok with admission. Admit to Antenatal for BMZ, IV antibiotics and MgSO4. MFM consult and NPO.  Orders entered per Dr. Elsie Stain verbal orders.  Assessment and Plan  A: SIUP at [redacted]w[redacted]d PROM  P: Admit to Antenatal Orders in Epic per Dr. Pollyann Samples, PA-C  09/04/2015, 12:47 AM

## 2015-09-04 NOTE — Consult Note (Addendum)
[facility]  Consultation Service: Neonatology   Dr. Jodi Mourning has asked for consultation on Ms. Jean, GDJ#[id number] regarding the care of a premature infant at 92 4/[redacted] weeks EGA. Thank you for inviting [place] to see this patient.   Reason for consult:  Explain the possible complications, the prognosis, and the care of a premature infant at 39 4/7 weeks.   Chief Complaint: 30 y.o. [id number] at 55w4dwho presents with PROM. Started on BTMZ, IV mag and antibiotics.  She denies regular contractions or bleeding.  Endorses good fetal movement.  Breech on [place].   My key findings of this patient's HPI are:  I have reviewed the patient's chart and have met with her. The salient information is as follows:   Name:    DMary-Ann Pennella EDC-OB:   This patient's mother is not on file. Gestational Age (OB):  34 4/[redacted] weeks EGA  Prenatal labs:  GBS negative, O+/DAT-, HIV-, HepB-, RI, RPR- Prenatal care:   good Pregnancy complications:  PROM, breech, velamentous cord Maternal antibiotics: Ampicillin, Erythromycin Maternal Steroids: yes   Most recent dose:  09/04/15 at [place]  Next most recent dose: 09/05/15    My recommendations for this patient and my actions included:   1. In the presence of the mother, her 2 sons ((24and 569yrs old) and gradmother, I spent 25 minutes discussing the possible complications and outcomes of prematurity at this gestational age.  I discussed the potential need for resuscitation at birth, mechanical ventilation and surfactant administration for respiratory distress, IV fluids pending establishment of enteral feeds (encouraged breast milk feeding though they prefers to formula feed), antibiotics for possible sepsis, temperature support, and monitoring.  I also discussed the potential length of stay in the neonatal intensive care unit for about 4 weeks. I discussed this with parents in detail and they expressed an understanding of the risks and complications of  prematurity.   2. I informed her that the NICU team would be present at the delivery. She agreed that all appropriate medical measures could be taken to resuscitate her infant at the delivery.   Final Impression:  Ms. RWeinheimeris a 89yofemale with a 3254/7 week IUP who is threatening to deliver and who now understands the possible complications and prognosis of her infant.   _____________________________________________________________________  Thank you for asking [place] participate in the care of this patient. Please do not hesitate to contact [email or web address] if you are aware of any further ways we can be of assistance.   Sincerely,  DMonia Sabal EKatherina Mires MD Neonatologist  I spent ~40 minutes in consultation time, of which 25 minutes was spent in direct face to face counseling.

## 2015-09-04 NOTE — Consult Note (Signed)
MFM consult, Staff Note:   ?[name]  been diagnosed and admitted for preterm premature rupture of membranes as established by her obstetrical provider. I was asked to evaluate her fetus and provide recommendations regarding the management of this diagnosis. I explained to her that in order for labor to occur either preterm or term, there has to be membrane activation, uterine contractility and cervical dilation, which of these manifest primarily varies from individual to individual and frequently it is a combination of synchronous and asynchronous activations that lead to eventual preterm delivery. Any number of precipitating events in combination may be the basis or pathologic activation of any of these pathways. Regardless, I explained to her that at this point our primary concern was for ascending intrauterine/intraamniotic infection, which poses risk to her as well as her fetus. She understood that inpatient management was essential with serial examinations and fetal heart rate tracings to screen for onset of infection and that evidence of such would prompt delivery.   I explained to her timing of delivery in absence of infection and in presence of reassuring fetal status has been debated historically and recently by many experts. Regardless, I cited to her that the best available and most well-accepted timing of delivery in context of otherwise reassuring maternal-fetus status affected by pPROM is at [redacted] weeks gestational age. This most fully balances the risk of prematurity against that of an occult intrauterine infection. Occult intrauterine infection is the second leading cause of cerebral palsy as opposed to the leading cause which is, of course, prematurity. She seemed to grasp the concept as well as our specialty's rationale.   Given recent evidence, it is reasonable to pursue expectant management to complete a corticosteroid course in the late preterm period (34-36 6/7 weeks) in which  delivery is delayed until steroid complete.  Given timing of the dosing, I would state it is reasonable to pursue delivery any time after completion of 2nd dose of BMZ tonight to time delivery on 10/03/15 or 10/04/15, but I would not recommend delaying delivery beyond that time interval.     All of your patient's questions were addressed to her satisfaction today. Although this was a lot of information to take in, she demonstrated excellent comprehension of my impression, recommendations, and the underlying rationale.   Summary of Recommendations:  1. I agree with latency antibiotics course until delivery;  2. Delivery by c/s for breech 3. Timing of delivery should be anticipated by steroid completion given that she has already achieved [redacted] weeks GA provided that testing remains reassuring and there is no evidence of chorioamnionitis.  4. I would deliver prior to steroid completion for nonreassuring fetal testing, evidence of chorioamnionitis, or other evidence of maternal or fetal deterioration.  5. The patient should be monitored by serial clinical exams, serial vital signs, daily EFM's/NST's (more often if clinically warranted), CBC only as clinically indicated (noting that WBC at or above 20,000 constitutes leukocytosis for pregnancy).  6. NICU consultation is recommended.  7. SCD's for DVT prophylaxis until fully ambulatory after cesarean section.  Time Spent:  I spent in excess of 40 minutes in consultation with this patient to review records, evaluate her case, and provide her with an adequate discussion and education. More than 50% of this time was spent in direct face-to-face counseling.   It was a pleasure seeing your patient in the office today. Thank you for consultation. Please do not hesitate to contact our service for any further questions.   Thank  you,   Jeffrey Morgan Denney  Denney, Jeffrey Morgan, MD, MS, FACOG  Assistant Professor  Section of Maternal-Fetal Medicine  [employer]   

## 2015-09-04 NOTE — Anesthesia Preprocedure Evaluation (Deleted)
Anesthesia Evaluation    Reviewed: Allergy & Precautions, H&P , NPO status , Patient's Chart, lab work & pertinent test results  Airway Mallampati: II  TM Distance: >3 FB Neck ROM: full    Dental no notable dental hx. (+) Dental Advidsory Given   Pulmonary neg pulmonary ROS, former smoker,    breath sounds clear to auscultation       Cardiovascular negative cardio ROS   Rhythm:regular Rate:Normal     Neuro/Psych negative neurological ROS  negative psych ROS   GI/Hepatic negative GI ROS, Neg liver ROS,   Endo/Other  negative endocrine ROS  Renal/GU negative Renal ROS     Musculoskeletal   Abdominal   Peds  Hematology   Anesthesia Other Findings   Reproductive/Obstetrics negative OB ROS                             Anesthesia Physical Anesthesia Plan  ASA: II and emergent  Anesthesia Plan: Spinal   Post-op Pain Management:    Induction:   Airway Management Planned:   Additional Equipment:   Intra-op Plan:   Post-operative Plan:   Informed Consent:   Plan Discussed with: Anesthesiologist  Anesthesia Plan Comments:         Anesthesia Quick Evaluation

## 2015-09-05 ENCOUNTER — Ambulatory Visit (HOSPITAL_COMMUNITY): Payer: Medicaid Other

## 2015-09-05 ENCOUNTER — Encounter (HOSPITAL_COMMUNITY): Payer: Self-pay | Admitting: Anesthesiology

## 2015-09-05 ENCOUNTER — Encounter: Payer: Self-pay | Admitting: Obstetrics

## 2015-09-05 NOTE — Anesthesia Preprocedure Evaluation (Addendum)
Anesthesia Evaluation  Patient identified by MRN, date of birth, ID band Patient awake    Reviewed: Allergy & Precautions, H&P , NPO status , Patient's Chart, lab work & pertinent test results  Airway Mallampati: I  TM Distance: >3 FB Neck ROM: full    Dental no notable dental hx.    Pulmonary former smoker,    Pulmonary exam normal        Cardiovascular negative cardio ROS Normal cardiovascular exam     Neuro/Psych negative neurological ROS  negative psych ROS   GI/Hepatic negative GI ROS, Neg liver ROS,   Endo/Other  negative endocrine ROS  Renal/GU negative Renal ROS     Musculoskeletal   Abdominal Normal abdominal exam  (+)   Peds  Hematology negative hematology ROS (+)   Anesthesia Other Findings   Reproductive/Obstetrics (+) Pregnancy                             Anesthesia Physical Anesthesia Plan  ASA: II  Anesthesia Plan: Spinal   Post-op Pain Management:    Induction:   Airway Management Planned:   Additional Equipment:   Intra-op Plan:   Post-operative Plan:   Informed Consent: I have reviewed the patients History and Physical, chart, labs and discussed the procedure including the risks, benefits and alternatives for the proposed anesthesia with the patient or authorized representative who has indicated his/her understanding and acceptance.     Plan Discussed with: CRNA and Surgeon  Anesthesia Plan Comments:         Anesthesia Quick Evaluation  

## 2015-09-05 NOTE — H&P (Signed)
Amy Davies is a 30 y.o. female presenting for ROM. Maternal Medical History:  Reason for admission: Rupture of membranes.   Fetal activity: Perceived fetal activity is normal.   Last perceived fetal movement was within the past hour.    Prenatal complications: no prenatal complications Prenatal Complications - Diabetes: none.    OB History    Gravida Para Term Preterm AB TAB SAB Ectopic Multiple Living   0 0 0 2     Past Medical History  Diagnosis Date  . Irritable bowel syndrome    Past Surgical History  Procedure Laterality Date  . Induced abortion    . No past surgeries     Family History: family history is negative for Asthma, Diabetes, Heart disease, Hypertension, Kidney disease, Stroke, and Hearing loss. Social History:  reports that she has quit smoking. Her smoking use included Cigarettes. She has never used smokeless tobacco. She reports that she does not drink alcohol or use illicit drugs.   Prenatal Transfer Tool  Maternal Diabetes: No Genetic Screening: Normal Maternal Ultrasounds/Referrals: Normal Fetal Ultrasounds or other Referrals:  None Maternal Substance Abuse:  No Significant Maternal Medications:  None Significant Maternal Lab Results:  None Other Comments:  None  Review of Systems  All other systems reviewed and are negative.     Blood pressure 110/51, pulse 99, temperature 98 F (36.7 C), temperature source Oral, resp. rate 18, height 5' 6.5" (1.689 m), weight 194 lb (87.998 kg), last menstrual period 01/05/2015, SpO2 100 %. Maternal Exam:  Abdomen: Patient reports no abdominal tenderness. Fetal presentation: breech     Physical Exam  Nursing note and vitals reviewed. Constitutional: She is oriented to person, place, and time. She appears well-developed and well-nourished.  HENT:  Head: Normocephalic and atraumatic.  Eyes: Conjunctivae are normal. Pupils are equal, round, and reactive to light.  Neck: Normal range of  motion. Neck supple.  Cardiovascular: Normal rate and regular rhythm.   Respiratory: Effort normal and breath sounds normal.  GI: Soft.  Genitourinary: Vagina normal and uterus normal.  Musculoskeletal: Normal range of motion.  Neurological: She is alert and oriented to person, place, and time.  Skin: Skin is warm and dry.  Psychiatric: She has a normal mood and affect. Her behavior is normal. Judgment and thought content normal.    Prenatal labs: ABO, Rh: --/--/O POS, O POS (02/21 0040) Antibody: NEG (02/21 0040) Rubella: 1.99 (09/08 0921) RPR: NON REAC (12/27 1303)  HBsAg: NEGATIVE (09/08 0921)  HIV: NONREACTIVE (12/27 1303)  GBS:     Assessment/Plan: 34 weeks.  PPROM.  Breech.  Stable.  Admit.   HARPER,CHARLES A 09/05/2015, 8:15 AM

## 2015-09-05 NOTE — Plan of Care (Signed)
Problem: Coping: Goal: Ability to identify and develop effective coping behavior will improve Outcome: Completed/Met Date Met:  09/05/15 Strong family support system Goal: Participation in decision-making will improve Outcome: Completed/Met Date Met:  09/05/15 Verbalizes concerns and discusses measures to support coping skills

## 2015-09-06 ENCOUNTER — Encounter (HOSPITAL_COMMUNITY)
Admission: AD | Disposition: A | Discharge status: Home / Self Care | Payer: Self-pay | Source: Ambulatory Visit | Attending: Obstetrics

## 2015-09-06 ENCOUNTER — Inpatient Hospital Stay (HOSPITAL_COMMUNITY): Payer: Medicaid Other | Admitting: Anesthesiology

## 2015-09-06 ENCOUNTER — Encounter: Payer: Self-pay | Admitting: Obstetrics

## 2015-09-06 ENCOUNTER — Encounter (HOSPITAL_COMMUNITY): Payer: Self-pay | Admitting: Anesthesiology

## 2015-09-06 ENCOUNTER — Inpatient Hospital Stay (HOSPITAL_COMMUNITY): Payer: Medicaid Other

## 2015-09-06 DIAGNOSIS — Z98891 History of uterine scar from previous surgery: Secondary | ICD-10-CM

## 2015-09-06 SURGERY — Surgical Case
Anesthesia: Spinal

## 2015-09-06 MED ORDER — TRIAMCINOLONE ACETONIDE 40 MG/ML IJ SUSP
INTRAMUSCULAR | Status: DC | PRN
  Administered 2015-09-06: 40 mg via INTRADERMAL

## 2015-09-06 MED ORDER — BUPIVACAINE HCL (PF) 0.25 % IJ SOLN
INTRAMUSCULAR | Status: AC
  Filled 2015-09-06: qty 30

## 2015-09-06 MED ORDER — LACTATED RINGERS IV SOLN
40.0000 [IU] | INTRAVENOUS | Status: DC | PRN
  Administered 2015-09-06: 40 [IU] via INTRAVENOUS

## 2015-09-06 MED ORDER — TETANUS-DIPHTH-ACELL PERTUSSIS 5-2.5-18.5 LF-MCG/0.5 IM SUSP: 0.5000 mL | Freq: Once | INTRAMUSCULAR | Status: DC

## 2015-09-06 MED ORDER — OXYCODONE-ACETAMINOPHEN 5-325 MG PO TABS
2.0000 | ORAL_TABLET | ORAL | Status: DC | PRN
  Administered 2015-09-06 – 2015-09-09 (×13): 2 via ORAL
  Filled 2015-09-06 (×13): qty 2

## 2015-09-06 MED ORDER — SCOPOLAMINE 1 MG/3DAYS TD PT72
MEDICATED_PATCH | TRANSDERMAL | Status: DC | PRN
  Administered 2015-09-06: 1 via TRANSDERMAL

## 2015-09-06 MED ORDER — SODIUM CHLORIDE 0.9 % IR SOLN
Status: DC | PRN
  Administered 2015-09-06: 1000 mL

## 2015-09-06 MED ORDER — ZOLPIDEM TARTRATE 5 MG PO TABS: 5.0000 mg | ORAL_TABLET | Freq: Every evening | ORAL | Status: DC | PRN

## 2015-09-06 MED ORDER — MORPHINE SULFATE (PF) 0.5 MG/ML IJ SOLN
INTRAMUSCULAR | Status: DC | PRN
  Administered 2015-09-06: .2 mg via INTRATHECAL

## 2015-09-06 MED ORDER — LANOLIN HYDROUS EX OINT: 1.0000 "application " | TOPICAL_OINTMENT | CUTANEOUS | Status: DC | PRN

## 2015-09-06 MED ORDER — PRENATAL MULTIVITAMIN CH
1.0000 | ORAL_TABLET | Freq: Every day | ORAL | Status: DC
  Filled 2015-09-06 (×2): qty 1

## 2015-09-06 MED ORDER — NALBUPHINE HCL 10 MG/ML IJ SOLN
5.0000 mg | Freq: Once | INTRAMUSCULAR | Status: AC | PRN
  Administered 2015-09-06: 5 mg via INTRAVENOUS

## 2015-09-06 MED ORDER — DEXTROSE 5 % IV SOLN
1.0000 ug/kg/h | INTRAVENOUS | Status: DC | PRN
  Filled 2015-09-06: qty 2

## 2015-09-06 MED ORDER — MENTHOL 3 MG MT LOZG: 1.0000 | LOZENGE | OROMUCOSAL | Status: DC | PRN

## 2015-09-06 MED ORDER — SENNOSIDES-DOCUSATE SODIUM 8.6-50 MG PO TABS
2.0000 | ORAL_TABLET | ORAL | Status: DC
  Administered 2015-09-06 – 2015-09-08 (×3): 2 via ORAL
  Filled 2015-09-06 (×3): qty 2

## 2015-09-06 MED ORDER — BUPIVACAINE IN DEXTROSE 0.75-8.25 % IT SOLN
INTRATHECAL | Status: DC | PRN
  Administered 2015-09-06: 2 mL via INTRATHECAL

## 2015-09-06 MED ORDER — SIMETHICONE 80 MG PO CHEW
80.0000 mg | CHEWABLE_TABLET | ORAL | Status: DC
  Administered 2015-09-06 – 2015-09-08 (×3): 80 mg via ORAL
  Filled 2015-09-06 (×3): qty 1

## 2015-09-06 MED ORDER — FENTANYL CITRATE (PF) 100 MCG/2ML IJ SOLN
INTRAMUSCULAR | Status: AC
  Filled 2015-09-06: qty 2

## 2015-09-06 MED ORDER — NALBUPHINE HCL 10 MG/ML IJ SOLN
5.0000 mg | INTRAMUSCULAR | Status: DC | PRN
  Filled 2015-09-06 (×2): qty 1

## 2015-09-06 MED ORDER — IBUPROFEN 600 MG PO TABS
600.0000 mg | ORAL_TABLET | Freq: Four times a day (QID) | ORAL | Status: DC
  Administered 2015-09-06 – 2015-09-09 (×12): 600 mg via ORAL
  Filled 2015-09-06 (×11): qty 1

## 2015-09-06 MED ORDER — CITRIC ACID-SODIUM CITRATE 334-500 MG/5ML PO SOLN
ORAL | Status: AC
  Administered 2015-09-06: 30 mL
  Filled 2015-09-06: qty 15

## 2015-09-06 MED ORDER — MEPERIDINE HCL 25 MG/ML IJ SOLN: 6.2500 mg | INTRAMUSCULAR | Status: DC | PRN

## 2015-09-06 MED ORDER — TRIAMCINOLONE ACETONIDE 40 MG/ML IJ SUSP
40.0000 mg | Freq: Once | INTRAMUSCULAR | Status: DC
  Filled 2015-09-06: qty 1

## 2015-09-06 MED ORDER — PHENYLEPHRINE 8 MG IN D5W 100 ML (0.08MG/ML) PREMIX OPTIME
INJECTION | INTRAVENOUS | Status: DC | PRN
  Administered 2015-09-06: 60 ug/min via INTRAVENOUS

## 2015-09-06 MED ORDER — HYDROMORPHONE HCL 1 MG/ML IJ SOLN: 0.2500 mg | INTRAMUSCULAR | Status: DC | PRN

## 2015-09-06 MED ORDER — LACTATED RINGERS IV SOLN
INTRAVENOUS | Status: DC
  Administered 2015-09-06: 20:00:00 via INTRAVENOUS

## 2015-09-06 MED ORDER — BUPIVACAINE HCL (PF) 0.25 % IJ SOLN
INTRAMUSCULAR | Status: DC | PRN
  Administered 2015-09-06: 30 mL

## 2015-09-06 MED ORDER — KETOROLAC TROMETHAMINE 30 MG/ML IJ SOLN: 30.0000 mg | Freq: Four times a day (QID) | INTRAMUSCULAR | Status: DC | PRN

## 2015-09-06 MED ORDER — DIPHENHYDRAMINE HCL 25 MG PO CAPS
25.0000 mg | ORAL_CAPSULE | ORAL | Status: DC | PRN
  Administered 2015-09-06 – 2015-09-08 (×6): 25 mg via ORAL
  Filled 2015-09-06 (×5): qty 1

## 2015-09-06 MED ORDER — PHENYLEPHRINE 8 MG IN D5W 100 ML (0.08MG/ML) PREMIX OPTIME
INJECTION | INTRAVENOUS | Status: AC
  Filled 2015-09-06: qty 100

## 2015-09-06 MED ORDER — SODIUM CHLORIDE 0.9% FLUSH: 3.0000 mL | INTRAVENOUS | Status: DC | PRN

## 2015-09-06 MED ORDER — ONDANSETRON HCL 4 MG/2ML IJ SOLN
INTRAMUSCULAR | Status: DC | PRN
  Administered 2015-09-06: 4 mg via INTRAVENOUS

## 2015-09-06 MED ORDER — WITCH HAZEL-GLYCERIN EX PADS: 1.0000 "application " | MEDICATED_PAD | CUTANEOUS | Status: DC | PRN

## 2015-09-06 MED ORDER — SIMETHICONE 80 MG PO CHEW: 80.0000 mg | CHEWABLE_TABLET | ORAL | Status: DC | PRN

## 2015-09-06 MED ORDER — MORPHINE SULFATE (PF) 0.5 MG/ML IJ SOLN
INTRAMUSCULAR | Status: AC
  Filled 2015-09-06: qty 10

## 2015-09-06 MED ORDER — DIBUCAINE 1 % RE OINT: 1.0000 "application " | TOPICAL_OINTMENT | RECTAL | Status: DC | PRN

## 2015-09-06 MED ORDER — OXYTOCIN 10 UNIT/ML IJ SOLN: 2.5000 [IU]/h | INTRAMUSCULAR | Status: AC

## 2015-09-06 MED ORDER — PROMETHAZINE HCL 25 MG/ML IJ SOLN: 6.2500 mg | INTRAMUSCULAR | Status: DC | PRN

## 2015-09-06 MED ORDER — DIPHENHYDRAMINE HCL 50 MG/ML IJ SOLN
12.5000 mg | INTRAMUSCULAR | Status: DC | PRN
  Administered 2015-09-06: 50 mg via INTRAVENOUS
  Filled 2015-09-06: qty 1

## 2015-09-06 MED ORDER — NALBUPHINE HCL 10 MG/ML IJ SOLN
5.0000 mg | INTRAMUSCULAR | Status: DC | PRN
  Administered 2015-09-07: 5 mg via SUBCUTANEOUS

## 2015-09-06 MED ORDER — ACETAMINOPHEN 325 MG PO TABS: 650.0000 mg | ORAL_TABLET | ORAL | Status: DC | PRN

## 2015-09-06 MED ORDER — FENTANYL CITRATE (PF) 100 MCG/2ML IJ SOLN
INTRAMUSCULAR | Status: DC | PRN
  Administered 2015-09-06: 10 ug via INTRATHECAL

## 2015-09-06 MED ORDER — NALBUPHINE HCL 10 MG/ML IJ SOLN: 5.0000 mg | Freq: Once | INTRAMUSCULAR | Status: AC | PRN

## 2015-09-06 MED ORDER — ONDANSETRON HCL 4 MG/2ML IJ SOLN
INTRAMUSCULAR | Status: AC
  Filled 2015-09-06: qty 2

## 2015-09-06 MED ORDER — KETOROLAC TROMETHAMINE 30 MG/ML IJ SOLN
INTRAMUSCULAR | Status: AC
  Administered 2015-09-06: 30 mg via INTRAMUSCULAR
  Filled 2015-09-06: qty 1

## 2015-09-06 MED ORDER — OXYCODONE-ACETAMINOPHEN 5-325 MG PO TABS
1.0000 | ORAL_TABLET | ORAL | Status: DC | PRN
  Administered 2015-09-09 (×2): 1 via ORAL
  Filled 2015-09-06 (×5): qty 1

## 2015-09-06 MED ORDER — ONDANSETRON HCL 4 MG/2ML IJ SOLN: 4.0000 mg | Freq: Three times a day (TID) | INTRAMUSCULAR | Status: DC | PRN

## 2015-09-06 MED ORDER — DIPHENHYDRAMINE HCL 25 MG PO CAPS
25.0000 mg | ORAL_CAPSULE | Freq: Four times a day (QID) | ORAL | Status: DC | PRN
  Filled 2015-09-06 (×2): qty 1

## 2015-09-06 MED ORDER — OXYTOCIN 10 UNIT/ML IJ SOLN
INTRAMUSCULAR | Status: AC
  Filled 2015-09-06: qty 4

## 2015-09-06 MED ORDER — LACTATED RINGERS IV SOLN
INTRAVENOUS | Status: DC | PRN
  Administered 2015-09-06: 08:00:00 via INTRAVENOUS

## 2015-09-06 MED ORDER — SIMETHICONE 80 MG PO CHEW
80.0000 mg | CHEWABLE_TABLET | Freq: Three times a day (TID) | ORAL | Status: DC
Start: 2015-09-06 — End: 2015-09-09
  Administered 2015-09-07 – 2015-09-08 (×5): 80 mg via ORAL
  Filled 2015-09-06 (×5): qty 1

## 2015-09-06 MED ORDER — KETOROLAC TROMETHAMINE 30 MG/ML IJ SOLN
30.0000 mg | Freq: Four times a day (QID) | INTRAMUSCULAR | Status: DC | PRN
  Administered 2015-09-06: 30 mg via INTRAMUSCULAR

## 2015-09-06 MED ORDER — SCOPOLAMINE 1 MG/3DAYS TD PT72: 1.0000 | MEDICATED_PATCH | Freq: Once | TRANSDERMAL | Status: DC

## 2015-09-06 MED ORDER — NALOXONE HCL 0.4 MG/ML IJ SOLN: 0.4000 mg | INTRAMUSCULAR | Status: DC | PRN

## 2015-09-06 SURGICAL SUPPLY — 34 items
CLAMP CORD UMBIL (MISCELLANEOUS) IMPLANT
CLOTH BEACON ORANGE TIMEOUT ST (SAFETY) ×2 IMPLANT
CONTAINER PREFILL 10% NBF 15ML (MISCELLANEOUS) ×4 IMPLANT
DRSG OPSITE POSTOP 4X10 (GAUZE/BANDAGES/DRESSINGS) ×2 IMPLANT
DURAPREP 26ML APPLICATOR (WOUND CARE) ×2 IMPLANT
ELECT REM PT RETURN 9FT ADLT (ELECTROSURGICAL) ×2
ELECTRODE REM PT RTRN 9FT ADLT (ELECTROSURGICAL) ×1 IMPLANT
EXTRACTOR VACUUM M CUP 4 TUBE (SUCTIONS) IMPLANT
GLOVE BIO SURGEON STRL SZ8 (GLOVE) ×2 IMPLANT
GLOVE BIOGEL PI IND STRL 7.0 (GLOVE) ×1 IMPLANT
GLOVE BIOGEL PI INDICATOR 7.0 (GLOVE) ×1
GOWN STRL REUS W/TWL LRG LVL3 (GOWN DISPOSABLE) ×4 IMPLANT
KIT ABG SYR 3ML LUER SLIP (SYRINGE) IMPLANT
NEEDLE HYPO 22GX1.5 SAFETY (NEEDLE) ×2 IMPLANT
NEEDLE HYPO 25X5/8 SAFETYGLIDE (NEEDLE) ×2 IMPLANT
NS IRRIG 1000ML POUR BTL (IV SOLUTION) ×2 IMPLANT
PACK C SECTION WH (CUSTOM PROCEDURE TRAY) ×2 IMPLANT
PAD OB MATERNITY 4.3X12.25 (PERSONAL CARE ITEMS) ×2 IMPLANT
PENCIL SMOKE EVAC W/HOLSTER (ELECTROSURGICAL) ×2 IMPLANT
RTRCTR C-SECT PINK 25CM LRG (MISCELLANEOUS) ×2 IMPLANT
STAPLER VISISTAT 35W (STAPLE) ×2 IMPLANT
SUT GUT PLAIN 0 CT-3 TAN 27 (SUTURE) IMPLANT
SUT MNCRL 0 VIOLET CTX 36 (SUTURE) ×3 IMPLANT
SUT MNCRL AB 4-0 PS2 18 (SUTURE) IMPLANT
SUT MON AB 2-0 CT1 27 (SUTURE) ×2 IMPLANT
SUT MON AB 3-0 SH 27 (SUTURE)
SUT MON AB 3-0 SH27 (SUTURE) IMPLANT
SUT MONOCRYL 0 CTX 36 (SUTURE) ×3
SUT PLAIN 2 0 XLH (SUTURE) IMPLANT
SUT VIC AB 0 CTX 36 (SUTURE) ×4
SUT VIC AB 0 CTX36XBRD ANBCTRL (SUTURE) ×2 IMPLANT
SYR CONTROL 10ML LL (SYRINGE) ×2 IMPLANT
TOWEL OR 17X24 6PK STRL BLUE (TOWEL DISPOSABLE) ×2 IMPLANT
TRAY FOLEY CATH SILVER 14FR (SET/KITS/TRAYS/PACK) ×2 IMPLANT

## 2015-09-06 NOTE — Anesthesia Procedure Notes (Signed)
Spinal Patient location during procedure: OR Start time: 09/06/2015 7:52 AM End time: 09/06/2015 7:00 AM Staffing Anesthesiologist: Leilani Able Performed by: anesthesiologist  Preanesthetic Checklist Completed: patient identified, surgical consent, pre-op evaluation, timeout performed, IV checked, risks and benefits discussed and monitors and equipment checked Spinal Block Patient position: sitting Prep: site prepped and draped and DuraPrep Patient monitoring: heart rate, cardiac monitor, continuous pulse ox and blood pressure Approach: midline Location: L3-4 Injection technique: single-shot Needle Needle type: Sprotte  Needle gauge: 24 G Needle length: 9 cm Needle insertion depth: 5 cm Assessment Sensory level: T4

## 2015-09-06 NOTE — OB Triage Provider Note (Addendum)
Addendum: No change in status.    A/P:  34.6 weeks. Breech.  PPROM.  For C/S.  Coral Ceo MD

## 2015-09-06 NOTE — Anesthesia Postprocedure Evaluation (Signed)
Anesthesia Post Note  Patient: [name]  Procedure(s) Performed: Procedure(s) (LRB): CESAREAN SECTION (N/A)  Patient location during evaluation: Mother Baby Anesthesia Type: Spinal Level of consciousness: awake and alert and oriented Pain management: satisfactory to patient Vital Signs Assessment: post-procedure vital signs reviewed and stable Respiratory status: respiratory function stable and spontaneous breathing Cardiovascular status: blood pressure returned to baseline Postop Assessment: no headache, no backache, spinal receding, patient able to bend at knees and adequate PO intake Anesthetic complications: no    Last Vitals:  Filed Vitals:   09/06/15 1145 09/06/15 1245  BP: 126/66 117/70  Pulse: 67 67  Temp: 36.4 C 36.7 C  Resp: 18 18    Last Pain:  Filed Vitals:   09/06/15 1318  PainSc: 4                  Ahonesty Woodfin

## 2015-09-06 NOTE — Transfer of Care (Signed)
Immediate Anesthesia Transfer of Care Note  Patient: Amy Davies  Procedure(s) Performed: Procedure(s): CESAREAN SECTION (N/A)  Patient Location: PACU  Anesthesia Type:Spinal  Level of Consciousness: awake, alert  and oriented  Airway & Oxygen Therapy: Patient Spontanous Breathing  Post-op Assessment: Report given to RN and Post -op Vital signs reviewed and stable  Post vital signs: Reviewed and stable  Last Vitals:  Filed Vitals:   09/06/15 0052 09/06/15 0734  BP: 113/54 114/58  Pulse: 102 104  Temp: 36.8 C 37.1 C  Resp: 18 18    Complications: No apparent anesthesia complications

## 2015-09-06 NOTE — Anesthesia Postprocedure Evaluation (Signed)
Anesthesia Post Note  Patient: Amy Davies  Procedure(s) Performed: Procedure(s) (LRB): CESAREAN SECTION (N/A)  Patient location during evaluation: PACU Anesthesia Type: Spinal Level of consciousness: awake Pain management: pain level controlled Vital Signs Assessment: post-procedure vital signs reviewed and stable Respiratory status: spontaneous breathing Cardiovascular status: stable Postop Assessment: no headache, no backache, spinal receding, patient able to bend at knees and no signs of nausea or vomiting Anesthetic complications: no    Last Vitals:  Filed Vitals:   09/06/15 0734 09/06/15 0900  BP: 114/58 103/75  Pulse: 104 70  Temp: 37.1 C 36.7 C  Resp: 18 20    Last Pain:  Filed Vitals:   09/06/15 0912  PainSc: 0-No pain                 Amy Davies,Amy Davies

## 2015-09-06 NOTE — Op Note (Signed)
Cesarean Section Procedure Note   [name]   09/03/2015 - 09/06/2015  Indications: Breech Presentation and 34 weeks.  PPROM.   Pre-operative Diagnosis: Primary Cesarean Section (breech presentation) Preterm Rupture of Membranes.   Post-operative Diagnosis: Same   Surgeon: Coral Ceo A  Assistants: Kathreen Cosier.  Anesthesia: spinal  Procedure Details:  The patient was seen in the Holding Room. The risks, benefits, complications, treatment options, and expected outcomes were discussed with the patient. The patient concurred with the proposed plan, giving informed consent. The patient was identified as Amy Davies and the procedure verified as C-Section Delivery. A Time Out was held and the above information confirmed.  After induction of anesthesia, the patient was draped and prepped in the usual sterile manner. A transverse incision was made and carried down through the subcutaneous tissue to the fascia. The fascial incision was made and extended transversely. The fascia was separated from the underlying rectus tissue superiorly and inferiorly. The peritoneum was identified and entered. The peritoneal incision was extended longitudinally. The utero-vesical peritoneal reflection was incised transversely and the bladder flap was bluntly freed from the lower uterine segment. A low transverse uterine incision was made. Delivered from cephalic presentation was a 2170 gram living newborn female infant(s). APGAR (1 MIN):   APGAR (5 MINS):   APGAR (10 MINS):    A cord ph was not sent. The umbilical cord was clamped and cut cord. A sample was obtained for evaluation. The placenta was removed Intact and appeared normal.  The uterine incision was closed with running locked sutures of 1-0 Monocryl. A second imbricating layer of the same suture was placed.  Hemostasis was observed. The paracolic gutters were irrigated. The parieto peritoneum was closed in a running fashion with 2-0 Vicryl.   The fascia was then reapproximated with running sutures of 0 Vicryl.  The skin was closed with staples.  Instrument, sponge, and needle counts were correct prior the abdominal closure and were correct at the conclusion of the case.    Findings: Normal uterus, ovaries and tubes   Estimated Blood Loss:   Total IV Fluids:   Urine Output: 300CC OF clear urine  Specimens: Placenta to pathology   Complications: no complications  Disposition: PACU - hemodynamically stable.  Maternal Condition: stable   Baby condition / location:  Couplet care / Skin to Skin    Signed: Surgeon(s): Brock Bad, MD Kathreen Cosier, MD

## 2015-09-06 NOTE — Addendum Note (Signed)
Addendum  created 09/06/15 1353 by Graciela Husbands, CRNA   Modules edited: Notes Section   Notes Section:  File: [id number]

## 2015-09-07 ENCOUNTER — Encounter (HOSPITAL_COMMUNITY): Payer: Self-pay | Admitting: Obstetrics

## 2015-09-07 LAB — CBC
HEMATOCRIT: 27 % — AB (ref 36.0–46.0)
HEMOGLOBIN: 8.9 g/dL — AB (ref 12.0–15.0)
MCH: 30.8 pg (ref 26.0–34.0)
MCHC: 33 g/dL (ref 30.0–36.0)
MCV: 93.4 fL (ref 78.0–100.0)
Platelets: 222 10*3/uL (ref 150–400)
RBC: 2.89 MIL/uL — AB (ref 3.87–5.11)
RDW: 13.3 % (ref 11.5–15.5)
WBC: 17.1 10*3/uL — AB (ref 4.0–10.5)

## 2015-09-07 LAB — RPR: RPR: NONREACTIVE

## 2015-09-07 MED ORDER — INFLUENZA VAC SPLIT QUAD 0.5 ML IM SUSY: 0.5000 mL | PREFILLED_SYRINGE | INTRAMUSCULAR | Status: DC

## 2015-09-07 MED ORDER — NALBUPHINE HCL 10 MG/ML IJ SOLN: 10.0000 mg | INTRAMUSCULAR | Status: DC | PRN

## 2015-09-07 MED ORDER — FERROUS SULFATE 325 (65 FE) MG PO TABS
325.0000 mg | ORAL_TABLET | Freq: Three times a day (TID) | ORAL | Status: DC
  Administered 2015-09-07 – 2015-09-08 (×6): 325 mg via ORAL
  Filled 2015-09-07 (×5): qty 1

## 2015-09-07 NOTE — Progress Notes (Signed)
CSW acknowledges NICU admission.    Patient screened out for psychosocial assessment since none of the following apply:  Psychosocial stressors documented in mother or baby's chart  Gestation less than 32 weeks  Code at delivery   Infant with anomalies  Please contact the Clinical Social Worker if specific needs arise, or by MOB's request.       

## 2015-09-07 NOTE — Progress Notes (Signed)
Subjective: Postpartum Day #1: Cesarean Delivery Patient reports incisional pain, tolerating PO and no problems voiding.  Patient reports attempting breast pump a few times, encouraged to pump every 3 hours.  Patient would like discharge Monday or later if possible d/t infant in NICU.    Objective: Vital signs in last 24 hours: Temp:  [97.5 F (36.4 C)-98.6 F (37 C)] 97.7 F (36.5 C) (02/24 0550) Pulse Rate:  [56-93] 70 (02/24 0550) Resp:  [12-20] 18 (02/24 0550) BP: (104-126)/(55-83) 104/56 mmHg (02/24 0550) SpO2:  [95 %-100 %] 100 % (02/24 0550)  Physical Exam:  General: alert, cooperative and no distress Lochia: appropriate Uterine Fundus: firm Incision: no significant drainage DVT Evaluation: No evidence of DVT seen on physical exam. No cords or calf tenderness. No significant calf/ankle edema.   Recent Labs  09/07/15 0546  HGB 8.9*  HCT 27.0*    Assessment/Plan: Status post Cesarean section. Doing well postoperatively.  Continue current care.  Infant in NICU.  Anemia.  Hx of keloids.    Roe Coombs, CNM 09/07/2015, 9:08 AM

## 2015-09-07 NOTE — Lactation Note (Addendum)
This note was copied from a baby's chart. Lactation Consultation Note  Patient Name: Amy Davies [id number] Date: 09/07/2015 Reason for consult: Initial assessment  With this mom of a NICU baby, now 82 hours old, and [redacted] weeks gestation CGA. Mom originally was formula feeding, but since she was taught about the benefits of breast milk, she decided to begin pumping. i assisted her with latching her baby in cross cradle hold for the first time. The baby latched shallow, 2 suckles, then asleep. Mom held baby then in cradle hold, skin to skin. I briefly explained that the baby was LPI, and small ( 4 lbs 14.7 oz), and latching was more nuzzling at this time, but good for the baby and mom's milk supply.The baby was ng fed during this feeding.  Mom is pumping every 3 hours, and seems motivated at this time to provide EBM for her baby. Mom knows to call for lactation as needed. WIC fax sent for appointment and DEP   Maternal Data Formula Feeding for Exclusion: Yes Reason for exclusion: Mother's choice to formula feed on admision Has patient been taught Hand Expression?: Yes Does the patient have breastfeeding experience prior to this delivery?: No  Feeding Feeding Type: Formula Nipple Type: Slow - flow Length of feed: 5 min  LATCH Score/Interventions Latch: Too sleepy or reluctant, no latch achieved, no sucking elicited. (baby sleepy, 2 suckles then asleep, very shallow)  Audible Swallowing: None  Type of Nipple: Everted at rest and after stimulation  Comfort (Breast/Nipple): Soft / non-tender     Hold (Positioning): Assistance needed to correctly position infant at breast and maintain latch. Intervention(s): Breastfeeding basics reviewed;Support Pillows;Position options;Skin to skin  LATCH Score: 5  Lactation Tools Discussed/Used Pump Review: Setup, frequency, and cleaning   Consult Status Consult Status: Follow-up Date: 09/08/15 Follow-up type: In-patient    Alfred Levins 09/07/2015, 3:23 PM

## 2015-09-08 NOTE — Lactation Note (Signed)
This note was copied from a baby's chart. Lactation Consultation Note  Patient Name: Amy Davies [id number] Date: 09/08/2015 Reason for consult: Follow-up assessment;NICU baby;Infant < 6lbs;Late preterm infant Infant is 95 hours old and seen by [facility] for follow-up assessment. Mom had just come back from NICU where mom did some skin-to-skin. Mom reports she has been trying to pump q 3hrs on initiate setting and gets drops. Mom reports that she didn't pump during the night due to not waking up. Mom plans to do both breastmilk & formula. Discussed WIC pump with mom - may do loaner DEBP for 12 days and will plan to talk with [facility] about pump with formula. Pt reports she may buy a pump as well since she is planning on getting formula from [facility]. Encouraged mom to ask for prescription for formula if infant will be discharged on special formula. Pt stated she feels comfortable with hand expression and has no questions at this time. Encouraged pt to ask for [facility] if she has further BF/ pumping questions.  Maternal Data    Feeding Feeding Type: Formula Nipple Type: Slow - flow Length of feed: 10 min  LATCH Score/Interventions                      Lactation Tools Discussed/Used WIC Program: Yes   Consult Status Consult Status: Follow-up Date: 09/09/15 Follow-up type: In-patient    Amy Davies 09/08/2015, 11:55 AM

## 2015-09-08 NOTE — Progress Notes (Signed)
Patient ID: Amy Davies, female   DOB: [birth year], 30 y.o.   MRN: [id number] Postop day 2 Blood pressure 07/18/2005 pulse 79 respiration 16 Fundus firm Dressing dry Legs negative lochia negative doing well

## 2015-09-08 NOTE — Lactation Note (Signed)
This note was copied from a baby's chart. Lactation Consultation Note  Patient Name: Amy Davies [id number] Date: 09/08/2015 Reason for consult: Follow-up assessment;NICU baby Infant is 46 hours old & seen by [facility] for follow-up. Mom stopped LC at the nurses station to say she was going down to the NICU to try to BF & would LC come down to help. LC met mom in NICU. Mom tried BF in cross-cradle on left breast; encouraged mom to do some hand expression first and mom had a drop. Baby had nipple in her mouth but had no seal & baby did not suck. Mom again hand expressed some milk and baby again had nipple in her mouth but did not open wide or latch. Mom stated she wanted to stop for now & give the bottle. Praised mom on hand expression & encouraged mom to do this after pumping. Encouraged mom to continue to do skin-to-skin and try to BF as often as she can/ NICU staff allow her to. Mom reported no questions at this time.   Maternal Data    Feeding Feeding Type: Breast Fed Nipple Type: Slow - flow Length of feed: 10 min  LATCH Score/Interventions Latch: Too sleepy or reluctant, no latch achieved, no sucking elicited.  Audible Swallowing: None Intervention(s): Hand expression  Type of Nipple: Everted at rest and after stimulation  Comfort (Breast/Nipple): Soft / non-tender     Hold (Positioning): Assistance needed to correctly position infant at breast and maintain latch. Intervention(s): Support Pillows;Position options  LATCH Score: 5  Lactation Tools Discussed/Used WIC Program: Yes   Consult Status Consult Status: Follow-up Date: 09/09/15 Follow-up type: In-patient    Amy Davies 09/08/2015, 12:58 PM

## 2015-09-09 NOTE — Lactation Note (Signed)
This note was copied from a baby's chart. Lactation Consultation Note  Patient Name: Amy Davies [id number] Date: 09/09/2015  Visited with Mom on day of discharge.  Baby in NICU, 59 hrs old.  WIC loaner rented.  Basic questions answered. Reminded her to call for assistance or questions prn.         [name] 09/09/2015, 11:35 AM

## 2015-09-09 NOTE — Progress Notes (Signed)

## 2015-09-09 NOTE — Discharge Instructions (Signed)
Discharge instructions ° °· You can wash your hair °· Shower °· Eat what you want °· Drink what you want °· See me in 6 weeks °· Your ankles are going to swell more in the next 2 weeks than when pregnant °· No sex for 6 weeks ° ° °Amy Vida A, MD 09/09/2015 ° ° °

## 2015-09-09 NOTE — Discharge Summary (Signed)
Obstetric Discharge Summary Reason for Admission: cesarean section Prenatal Procedures: none Intrapartum Procedures: cesarean: low cervical, transverse Postpartum Procedures: none Complications-Operative and Postpartum: none HEMOGLOBIN  Date Value Ref Range Status  09/07/2015 8.9* 12.0 - 15.0 g/dL Final   HCT  Date Value Ref Range Status  09/07/2015 27.0* 36.0 - 46.0 % Final    Physical Exam:  General: alert Lochia: appropriate Uterine Fundus: firm Incision: healing well DVT Evaluation: No evidence of DVT seen on physical exam.  Discharge Diagnoses: Term Pregnancy-delivered  Discharge Information: Date: 09/09/2015 Activity: pelvic rest Diet: routine Medications: Percocet Condition: improved Instructions: refer to practice specific booklet Discharge to: home Follow-up Information    Follow up with HARPER,CHARLES A, MD.   Specialty:  Obstetrics and Gynecology   Contact information:   [address] [place] [address] [phone]       Newborn Data: Live born female  Birth Weight: 4 lb 12.5 oz (2170 g) APGAR: 8, 8  Home with mother.  Jerardo Costabile A 09/09/2015, 7:01 AM

## 2015-09-09 NOTE — Progress Notes (Signed)
Patient ID: Amy Davies, female   DOB: 08-12-1985, 30 y.o.   MRN: [id number] Postop day 3 Blood pressure 113/71 pulse 95 respiration 18 Abdomen soft dressing dry legs negative doing well home today

## 2015-09-19 NOTE — Addendum Note (Signed)
Addendum  created 09/19/15 [id number] by Leilani AbleFranklin Emillie Chasen, MD   Modules edited: Anesthesia Responsible Staff

## 2015-09-20 ENCOUNTER — Ambulatory Visit: Payer: Medicaid Other | Admitting: Obstetrics

## 2015-09-20 ENCOUNTER — Encounter: Payer: Self-pay | Admitting: Obstetrics

## 2015-09-20 ENCOUNTER — Ambulatory Visit (INDEPENDENT_AMBULATORY_CARE_PROVIDER_SITE_OTHER): Payer: Medicaid Other | Admitting: Obstetrics

## 2015-09-20 DIAGNOSIS — Z3009 Encounter for other general counseling and advice on contraception: Secondary | ICD-10-CM

## 2015-09-20 DIAGNOSIS — G8918 Other acute postprocedural pain: Secondary | ICD-10-CM

## 2015-09-20 DIAGNOSIS — R109 Unspecified abdominal pain: Secondary | ICD-10-CM

## 2015-09-20 MED ORDER — OXYCODONE-ACETAMINOPHEN 10-325 MG PO TABS: 1.0000 | ORAL_TABLET | ORAL | Status: DC | PRN

## 2015-09-20 MED ORDER — IBUPROFEN 800 MG PO TABS: 800.0000 mg | ORAL_TABLET | Freq: Three times a day (TID) | ORAL | Status: DC | PRN

## 2015-09-21 ENCOUNTER — Encounter: Payer: Self-pay | Admitting: Obstetrics

## 2015-09-21 NOTE — Progress Notes (Signed)
Subjective:     Amy Davies is a 30 y.o. female who presents for a postpartum visit. She is 2 weeks postpartum following a low cervical transverse Cesarean section. I have fully reviewed the prenatal and intrapartum course. The delivery was at 34.6 gestational weeks. Outcome: primary cesarean section, low transverse incision. Anesthesia: spinal. Postpartum course has been normal. Baby's course has been in NICU. Baby is feeding by bottle - Similac Advance. Bleeding thin lochia. Bowel function is normal. Bladder function is normal. Patient is not sexually active. Contraception method is abstinence. Postpartum depression screening: negative.  Tobacco, alcohol and substance abuse history reviewed.  Adult immunizations reviewed including TDAP, rubella and varicella.  The following portions of the patient's history were reviewed and updated as appropriate: allergies, current medications, past family history, past medical history, past social history, past surgical history and problem list.  Review of Systems A comprehensive review of systems was negative.   Objective:    BP 120/79 mmHg  Pulse 60  Temp(Src) 98.1 F (36.7 C)  Wt 183 lb (83.008 kg)  LMP 01/05/2015  Breastfeeding? No         PE:      General:  Alert and no distress      Abdomen:  Soft, nontender.  Incision C, D, I.      Extremities:  No C, C, E.    Assessment:    2 weeks post op C/S.  Doing well.  Contraceptive counseling and advice  Plan:    1. Contraception: considering options.  IUD recommended. 2. Continue PNV's 3. Follow up in: 6 weeks or as needed.   Healthy lifestyle practices reviewed

## 2015-09-25 ENCOUNTER — Telehealth: Payer: Self-pay | Admitting: *Deleted

## 2015-09-25 ENCOUNTER — Ambulatory Visit (INDEPENDENT_AMBULATORY_CARE_PROVIDER_SITE_OTHER): Payer: Medicaid Other | Admitting: *Deleted

## 2015-09-25 ENCOUNTER — Ambulatory Visit: Payer: Medicaid Other

## 2015-09-25 VITALS — BP 121/83 | HR 65 | Wt 180.4 lb

## 2015-09-25 DIAGNOSIS — Z30013 Encounter for initial prescription of injectable contraceptive: Secondary | ICD-10-CM

## 2015-09-25 DIAGNOSIS — Z3202 Encounter for pregnancy test, result negative: Secondary | ICD-10-CM

## 2015-09-25 DIAGNOSIS — Z3042 Encounter for surveillance of injectable contraceptive: Secondary | ICD-10-CM | POA: Diagnosis not present

## 2015-09-25 LAB — POCT URINE PREGNANCY: PREG TEST UR: NEGATIVE

## 2015-09-25 MED ORDER — MEDROXYPROGESTERONE ACETATE 150 MG/ML IM SUSP: 150.0000 mg | INTRAMUSCULAR | Status: DC

## 2015-09-25 MED ORDER — MEDROXYPROGESTERONE ACETATE 150 MG/ML IM SUSP
150.0000 mg | Freq: Once | INTRAMUSCULAR | Status: AC
  Administered 2015-09-25: 150 mg via INTRAMUSCULAR

## 2015-09-25 NOTE — Telephone Encounter (Signed)
Patient wants to use the Depo until she can get the Paragard placed.

## 2015-09-25 NOTE — Progress Notes (Signed)
Pt is in office to start depo injection after her recent delivery.  Pt states that she is still having some light bleeding and has not had intercourse. UPT today is negative.  Reviewed with R.Denney, depo is approved for today.  Pt tolerated injection well.   Pt states that she would like a Paragard IUD, information was left for scheduling.  Pt was advised to RTO on 12-17-15 for depo if she did not get IUD. Pt states understanding.  BP 121/83 mmHg  Pulse 65  Wt 180 lb 6.4 oz (81.829 kg)  LMP   Administrations This Visit    medroxyPROGESTERone (DEPO-PROVERA) injection 150 mg    Admin Date Action Dose Route Administered By         09/25/2015 Given 150 mg Intramuscular Lanney GinsSuzanne D Foster, CMA

## 2015-10-24 ENCOUNTER — Ambulatory Visit: Payer: Medicaid Other | Admitting: Obstetrics

## 2015-10-30 ENCOUNTER — Ambulatory Visit (INDEPENDENT_AMBULATORY_CARE_PROVIDER_SITE_OTHER): Payer: Medicaid Other | Admitting: Certified Nurse Midwife

## 2015-10-30 ENCOUNTER — Telehealth: Payer: Self-pay

## 2015-10-30 DIAGNOSIS — Z3202 Encounter for pregnancy test, result negative: Secondary | ICD-10-CM | POA: Diagnosis not present

## 2015-10-30 DIAGNOSIS — Z3009 Encounter for other general counseling and advice on contraception: Secondary | ICD-10-CM

## 2015-10-30 LAB — POCT URINE PREGNANCY: Preg Test, Ur: NEGATIVE

## 2015-10-30 NOTE — Progress Notes (Signed)
Patient ID: Amy Davies, female   DOB: Nov 21, 1985, 30 y.o.   MRN: [id number]  Subjective:     Amy Davies is a 30 y.o. female who presents for a postpartum visit. She is 6 weeks postpartum following a low cervical transverse Cesarean section. I have fully reviewed the prenatal and intrapartum course. The delivery was at 34 gestational weeks d/t PPROM and breech presentation. Outcome: primary cesarean section, low transverse incision. Anesthesia: spinal. Postpartum course has been normal. Baby's course has been normal. Baby is feeding by both breast and bottle - Similac Advance. Bleeding no bleeding. Bowel function is normal. Bladder function is normal. Patient is not sexually active. Contraception method is Depo-Provera injections. Postpartum depression screening: negative.  Tobacco, alcohol and substance abuse history reviewed.  Adult immunizations reviewed including TDAP, rubella and varicella.  The following portions of the patient's history were reviewed and updated as appropriate: allergies, current medications, past family history, past medical history, past social history, past surgical history and problem list.  Review of Systems Pertinent items noted in HPI and remainder of comprehensive ROS otherwise negative.   Objective:    There were no vitals taken for this visit.  General:  alert, cooperative and no distress   Breasts:  inspection negative, no nipple discharge or bleeding, no masses or nodularity palpable  Lungs: clear to auscultation bilaterally  Heart:  regular rate and rhythm, S1, S2 normal, no murmur, click, rub or gallop  Abdomen: soft, non-tender; bowel sounds normal; no masses,  no organomegaly   Vulva:  not evaluated  Vagina: not evaluated  Cervix:  not evaluated  Corpus: normal  Adnexa:  not evaluated  Rectal Exam: Not performed.          50% of 15 min visit spent on counseling and coordination of care.  Assessment:     Normal 6 week postpartum exam. Pap  smear not done at today's visit.  Plan:    1. Contraception: abstinence, condoms and Depo-Provera injections 2.  Desires BTL, referral made 3. Follow up in: 6 months for annual exam or as needed.  2hr GTT for h/o GDM/screening for DM q 3 yrs per ADA recommendations Preconception counseling provided Healthy lifestyle practices reviewed

## 2015-10-30 NOTE — Telephone Encounter (Signed)
[facility] HAS REFERRAL FOR PATIENT - THEIR DOCTOR [facility] REVIEW [facility] - LET PATIENT KNOW THAT 10/30/15

## 2015-10-30 NOTE — Addendum Note (Signed)
Addended by: Elby BeckPAUL, Mearl Harewood F on: 10/30/2015 03:14 PM   Modules accepted: Orders

## 2015-10-31 ENCOUNTER — Encounter: Payer: Self-pay | Admitting: Obstetrics & Gynecology

## 2015-11-07 ENCOUNTER — Ambulatory Visit (INDEPENDENT_AMBULATORY_CARE_PROVIDER_SITE_OTHER): Payer: Medicaid Other | Admitting: Obstetrics

## 2015-11-07 ENCOUNTER — Encounter: Payer: Self-pay | Admitting: Obstetrics

## 2015-11-07 VITALS — BP 117/81 | HR 83 | Temp 97.7°F | Wt 181.0 lb

## 2015-11-07 DIAGNOSIS — R21 Rash and other nonspecific skin eruption: Secondary | ICD-10-CM

## 2015-11-07 MED ORDER — PREDNISONE 10 MG (21) PO TBPK: ORAL_TABLET | ORAL | Status: DC

## 2015-11-09 ENCOUNTER — Encounter: Payer: Self-pay | Admitting: Obstetrics

## 2015-11-09 NOTE — Progress Notes (Signed)
Patient ID: Amy Davies, female   DOB: 1985-07-30, 30 y.o.   MRN: [id number]  Chief Complaint  Patient presents with  . Rash    HPI [name] is a 30 y.o. female.  Papular, pruritic rash on back, breast and arms.  HPI  Past Medical History  Diagnosis Date  . Irritable bowel syndrome     Past Surgical History  Procedure Laterality Date  . Induced abortion    . No past surgeries    . Cesarean section N/A 09/06/2015    Procedure: CESAREAN SECTION;  Surgeon: Brock Bad, MD;  Location: [facility];  Service: Obstetrics;  Laterality: N/A;    Family History  Problem Relation Age of Onset  . Asthma Neg Hx   . Diabetes Neg Hx   . Heart disease Neg Hx   . Hypertension Neg Hx   . Kidney disease Neg Hx   . Stroke Neg Hx   . Hearing loss Neg Hx     Social History Social History  Substance Use Topics  . Smoking status: Former Smoker    Types: Cigarettes  . Smokeless tobacco: Never Used     Comment: July 2016  . Alcohol Use: No     Comment: socially; not since +preg    No Known Allergies  Current Outpatient Prescriptions  Medication Sig Dispense Refill  . ibuprofen (ADVIL,MOTRIN) 800 MG tablet Take 1 tablet (800 mg total) by mouth every 8 (eight) hours as needed for mild pain or moderate pain. 60 tablet 5  . medroxyPROGESTERone (DEPO-PROVERA) 150 MG/ML injection Inject 1 mL (150 mg total) into the muscle every 3 (three) months. 1 mL 0  . predniSONE (STERAPRED UNI-PAK 21 TAB) 10 MG (21) TBPK tablet Take as directed. 21 tablet 0   No current facility-administered medications for this visit.    Review of Systems Review of Systems Constitutional: negative for fatigue and weight loss Respiratory: negative for cough and wheezing Cardiovascular: negative for chest pain, fatigue and palpitations Gastrointestinal: negative for abdominal pain and change in bowel habits Genitourinary:negative Integument/breast: negative for nipple discharge Musculoskeletal:negative for  myalgias Neurological: negative for gait problems and tremors Behavioral/Psych: negative for abusive relationship, depression Endocrine: negative for temperature intolerance   Skin:  Papular, pruritic rash on back, breasts and arms   Blood pressure 117/81, pulse 83, temperature 97.7 F (36.5 C), weight 181 lb (82.101 kg), not currently breastfeeding.  Physical Exam Physical Exam General:   alert and no distress  Skin:   papular rash on back, arms and breasts  Lungs:   clear to auscultation bilaterally  Heart:   regular rate and rhythm, S1, S2 normal, no murmur, click, rub or gallop  Breasts:   normal without suspicious masses, nipple changes or axillary nodes  Abdomen:  normal findings: no organomegaly, soft, non-tender and no hernia    Data Reviewed Labs   Assessment     Pruritic, papular rash     Plan    Prednisone steroid taper Rx F/U 2 weeks   Orders Placed This Encounter  Procedures  . Ambulatory referral to Dermatology    Referral Priority:  Routine    Referral Type:  Consultation    Referral Reason:  Specialty Services Required    Requested Specialty:  Dermatology    Number of Visits Requested:  1   Meds ordered this encounter  Medications  . predniSONE (STERAPRED UNI-PAK 21 TAB) 10 MG (21) TBPK tablet    Sig: Take as directed.  Dispense:  21 tablet    Refill:  0

## 2015-11-12 ENCOUNTER — Encounter (HOSPITAL_COMMUNITY): Payer: Self-pay | Admitting: Emergency Medicine

## 2015-11-12 ENCOUNTER — Emergency Department (HOSPITAL_COMMUNITY)
Admission: EM | Admit: 2015-11-12 | Discharge: 2015-11-12 | Disposition: A | Discharge status: Home / Self Care | Payer: Medicaid Other | Attending: Emergency Medicine | Admitting: Emergency Medicine

## 2015-11-12 DIAGNOSIS — B059 Measles without complication: Secondary | ICD-10-CM | POA: Insufficient documentation

## 2015-11-12 DIAGNOSIS — Z791 Long term (current) use of non-steroidal anti-inflammatories (NSAID): Secondary | ICD-10-CM | POA: Insufficient documentation

## 2015-11-12 DIAGNOSIS — Z7952 Long term (current) use of systemic steroids: Secondary | ICD-10-CM | POA: Insufficient documentation

## 2015-11-12 DIAGNOSIS — R21 Rash and other nonspecific skin eruption: Secondary | ICD-10-CM | POA: Insufficient documentation

## 2015-11-12 DIAGNOSIS — Z79899 Other long term (current) drug therapy: Secondary | ICD-10-CM | POA: Insufficient documentation

## 2015-11-12 DIAGNOSIS — Z87891 Personal history of nicotine dependence: Secondary | ICD-10-CM | POA: Insufficient documentation

## 2015-11-12 MED ORDER — DIPHENHYDRAMINE HCL 25 MG PO TABS: 25.0000 mg | ORAL_TABLET | Freq: Four times a day (QID) | ORAL | Status: DC | PRN

## 2015-11-12 MED ORDER — BETAMETHASONE DIPROPIONATE 0.05 % EX OINT: TOPICAL_OINTMENT | Freq: Two times a day (BID) | CUTANEOUS | Status: DC

## 2015-11-12 NOTE — Discharge Instructions (Signed)
Please read and follow all provided instructions.  Your diagnoses today include:  1. Morbilliform rash    Tests performed today include:  Vital signs. See below for your results today.   Medications prescribed:   Take as prescribed   Home care instructions:  Follow any educational materials contained in this packet.  Follow-up instructions: Please follow-up with Dermatology for further evaluation of symptoms and treatment   Return instructions:   Please return to the Emergency Department if you do not get better, if you get worse, or new symptoms OR  - Fever (temperature greater than 101.19F)  - Bleeding that does not stop with holding pressure to the area    -Severe pain (please note that you may be more sore the day after your accident)  - Chest Pain  - Difficulty breathing  - Severe nausea or vomiting  - Inability to tolerate food and liquids  - Passing out  - Skin becoming red around your wounds  - Change in mental status (confusion or lethargy)  - New numbness or weakness     Please return if you have any other emergent concerns.  Additional Information:  Your vital signs today were: BP 111/77 mmHg   Pulse 83   Temp(Src) 97.9 F (36.6 C) (Oral)   Resp 18   SpO2 100% If your blood pressure (BP) was elevated above 135/85 this visit, please have this repeated by your doctor within one month. ---------------

## 2015-11-12 NOTE — ED Notes (Signed)
Per pt, states rash for 2 weeks-has been using topical creams with no relief-raised patches, itching and burning

## 2015-11-12 NOTE — ED Provider Notes (Signed)
CSN: [id number]     Arrival date & time 11/12/15  [id number] History   First MD Initiated Contact with Patient 11/12/15 1056     Chief Complaint  Patient presents with  . Rash   (Consider location/radiation/quality/duration/timing/severity/associated sxs/prior Treatment) HPI 30 y.o. female presents to the Emergency Department today complaining of rash on upper left torso. Pt states that she had dry skin patches on her underarm and legs x 2 weeks ago. She saw her OBGYN for her follow up after pregnancy and was given a prednisone taper. This past Friday, the patient took the prednisone and developed a rash on her torso. Raised. Itchy with mild burning sensation. Pt states she has not taken prednisone before. Started using topical steroid without relief over the area. Notes itchiness and burning is a 6/10. No fevers. No CP/SOB/ABD pain. No N/V/D. No other symptoms noted.    Past Medical History  Diagnosis Date  . Irritable bowel syndrome    Past Surgical History  Procedure Laterality Date  . Induced abortion    . No past surgeries    . Cesarean section N/A 09/06/2015    Procedure: CESAREAN SECTION;  Surgeon: Brock Badharles A Harper, MD;  Location: [facility];  Service: Obstetrics;  Laterality: N/A;   Family History  Problem Relation Age of Onset  . Asthma Neg Hx   . Diabetes Neg Hx   . Heart disease Neg Hx   . Hypertension Neg Hx   . Kidney disease Neg Hx   . Stroke Neg Hx   . Hearing loss Neg Hx    Social History  Substance Use Topics  . Smoking status: Former Smoker    Types: Cigarettes  . Smokeless tobacco: Never Used     Comment: July 2016  . Alcohol Use: No     Comment: socially; not since +preg   OB History    Gravida Para Term Preterm AB TAB SAB Ectopic Multiple Living   6 3 2 1 3 2 1  0 0 3     Review of Systems  Constitutional: Negative for fever.  HENT: Negative for trouble swallowing.   Skin: Positive for rash.   Allergies  Review of patient's allergies indicates no known  allergies.  Home Medications   Prior to Admission medications   Medication Sig Start Date End Date Taking? Authorizing Provider  ibuprofen (ADVIL,MOTRIN) 800 MG tablet Take 1 tablet (800 mg total) by mouth every 8 (eight) hours as needed for mild pain or moderate pain. 09/20/15   Brock Badharles A Harper, MD  medroxyPROGESTERone (DEPO-PROVERA) 150 MG/ML injection Inject 1 mL (150 mg total) into the muscle every 3 (three) months. 09/25/15   Brock Badharles A Harper, MD  predniSONE (STERAPRED UNI-PAK 21 TAB) 10 MG (21) TBPK tablet Take as directed. 11/07/15   Brock Badharles A Harper, MD   BP 111/77 mmHg  Pulse 83  Temp(Src) 97.9 F (36.6 C) (Oral)  Resp 18  SpO2 100% Physical Exam  Constitutional: She is oriented to person, place, and time. She appears well-developed and well-nourished.  HENT:  Head: Normocephalic and atraumatic.  Eyes: EOM are normal.  Cardiovascular: Normal rate and regular rhythm.   Pulmonary/Chest: Effort normal.  Abdominal: Soft.  Musculoskeletal: Normal range of motion.  Urticarial rash noted on upper left chest. Macular papular. Blanchable. Raised  Neurological: She is alert and oriented to person, place, and time.  Skin: Skin is warm and dry.  Psychiatric: She has a normal mood and affect. Her behavior is normal. Thought content normal.  Nursing note and vitals reviewed.  ED Course  Procedures (including critical care time) Labs Review Labs Reviewed - No data to display  Imaging Review No results found. I have personally reviewed and evaluated these images and lab results as part of my medical decision-making.   EKG Interpretation None      MDM  I have reviewed the relevant previous healthcare records. I obtained HPI from historian.  ED Course:  Assessment: Pt is a 29yF who presents with rash from drug eruption most likely from prednisone usage since Friday. On exam, pt in NAD. Nontoxic/nonseptic appearing. VSS. Afebrile. Rash located on left upper torso. Plan is to DC  home with steroid cream and antihistamine. Follow up with Dermatology. At time of discharge, Patient is in no acute distress. Vital Signs are stable. Patient is able to ambulate. Patient able to tolerate PO.    Disposition/Plan:  DC Home Additional Verbal discharge instructions given and discussed with patient.  Pt Instructed to f/u with Dermatology in the next week for evaluation and treatment of symptoms. Return precautions given Pt acknowledges and agrees with plan  Supervising Physician Jacalyn Lefevre, MD   Final diagnoses:  Morbilliform rash       Audry Pili, PA-C 11/12/15 1115

## 2015-11-19 ENCOUNTER — Encounter: Payer: Medicaid Other | Admitting: Obstetrics & Gynecology

## 2015-11-20 ENCOUNTER — Telehealth: Payer: Self-pay | Admitting: *Deleted

## 2015-11-20 DIAGNOSIS — N939 Abnormal uterine and vaginal bleeding, unspecified: Secondary | ICD-10-CM

## 2015-11-20 MED ORDER — NORETHINDRONE-ETH ESTRADIOL 1-35 MG-MCG PO TABS: ORAL_TABLET | ORAL | Status: DC

## 2015-11-20 NOTE — Telephone Encounter (Signed)
Patient requested call back. 10:29 Patient is still having brown discharge- she is not due next Depo until June. She wants the bleeding to stop. Per Dr Clearance CootsHarper- can call in OCP to stop bleeding. Note: Patient states she still has rash. She was seen at ED because her symptoms got worse. She does have an appointment on Friday with the Dermatologist. She will let us know what she find out.

## 2015-12-09 ENCOUNTER — Other Ambulatory Visit: Payer: Self-pay | Admitting: Certified Nurse Midwife

## 2015-12-28 ENCOUNTER — Ambulatory Visit (INDEPENDENT_AMBULATORY_CARE_PROVIDER_SITE_OTHER): Payer: Medicaid Other | Admitting: *Deleted

## 2015-12-28 VITALS — BP 122/88 | HR 76 | Wt 180.0 lb

## 2015-12-28 DIAGNOSIS — Z30013 Encounter for initial prescription of injectable contraceptive: Secondary | ICD-10-CM

## 2015-12-28 DIAGNOSIS — Z3049 Encounter for surveillance of other contraceptives: Secondary | ICD-10-CM | POA: Diagnosis not present

## 2015-12-28 DIAGNOSIS — Z308 Encounter for other contraceptive management: Secondary | ICD-10-CM

## 2015-12-28 LAB — POCT URINE PREGNANCY: Preg Test, Ur: NEGATIVE

## 2015-12-28 MED ORDER — MEDROXYPROGESTERONE ACETATE 150 MG/ML IM SUSP
150.0000 mg | Freq: Once | INTRAMUSCULAR | Status: AC
  Administered 2015-12-28: 150 mg via INTRAMUSCULAR

## 2015-12-28 MED ORDER — MEDROXYPROGESTERONE ACETATE 150 MG/ML IM SUSP: 150.0000 mg | INTRAMUSCULAR | Status: DC

## 2015-12-28 NOTE — Progress Notes (Signed)
Pt is in office for depo injection.  Pt is 4 days past cut off to receive depo.  Pt state that she had trouble with staff getting her scheduled for her depo.  Pt states that she had intercourse a week ago.  Pt was made aware review with R.Denney would be made due to mix up in scheduling. UPT in office today is negative. Per R.Denney, CNM pt is approved to receive her depo injection today. Injection was given, pt tolerated well.  Pt was advised to RTO on 03-20-16 for next injection.  Pt is also due for her annual exam after 03-21-16.  I scheduled pt for 03-25-16 for annual/depo appt. Pt was advised to keep appt in order to stay on time for her depo injection.  Pt states that she is still thinking of having an IUD placed, pt advised to make office aware if she would like to change methods.  Pt states all understanding.

## 2016-01-05 ENCOUNTER — Ambulatory Visit (HOSPITAL_COMMUNITY)
Admission: EM | Admit: 2016-01-05 | Discharge: 2016-01-05 | Disposition: A | Discharge status: Home / Self Care | Payer: Self-pay | Attending: Family Medicine | Admitting: Family Medicine

## 2016-01-05 ENCOUNTER — Encounter (HOSPITAL_COMMUNITY): Payer: Self-pay

## 2016-01-05 DIAGNOSIS — K0889 Other specified disorders of teeth and supporting structures: Secondary | ICD-10-CM

## 2016-01-05 MED ORDER — OXYCODONE-ACETAMINOPHEN 5-325 MG PO TABS: 2.0000 | ORAL_TABLET | ORAL | Status: DC | PRN

## 2016-01-05 MED ORDER — PENICILLIN V POTASSIUM 500 MG PO TABS: 500.0000 mg | ORAL_TABLET | Freq: Two times a day (BID) | ORAL | Status: AC

## 2016-01-05 MED ORDER — FLUCONAZOLE 200 MG PO TABS: 200.0000 mg | ORAL_TABLET | Freq: Once | ORAL | Status: AC

## 2016-01-05 NOTE — ED Provider Notes (Signed)
CSN: [id number]     Arrival date & time 01/05/16  1504 History   None    No chief complaint on file.  (Consider location/radiation/quality/duration/timing/severity/associated sxs/prior Treatment) Patient is a 30 y.o. female presenting with tooth pain. The history is provided by the patient. No language interpreter was used.  Dental Pain Toothache location: Left upper tooth x 2 months, however when she was chewing chicken nugget today she felf her tooth broke. Quality:  Throbbing and sharp Severity:  Moderate Onset quality:  Gradual (worsened today. pain is about 8/10 in severity) Duration:  2 months Timing:  Constant Progression:  Worsening Chronicity:  Recurrent (She had been seen by a dentist in the past and was supposed to get her wisdom tooth pulled  5 months ago but she did not follow up.) Context: dental fracture   Context: not abscess, not malocclusion, normal dentition and not trauma   Previous work-up:  Dental exam Relieved by:  NSAIDs ( percocet 5 mg from left over from her recent C-section surgery) Worsened by:  Nothing tried Ineffective treatments:  NSAIDs (800 mg Ibuprofen) Associated symptoms: facial pain, gum swelling and headaches   Associated symptoms: no difficulty swallowing, no facial swelling, no fever, no neck swelling and no oral lesions   Risk factors: smoking   Risk factors: no alcohol problem, no chewing tobacco use and sufficient dental care   Risk factors comment:  Ocassional alcohol drinking and smoking   Past Medical History  Diagnosis Date  . Irritable bowel syndrome    Past Surgical History  Procedure Laterality Date  . Induced abortion    . No past surgeries    . Cesarean section N/A 09/06/2015    Procedure: CESAREAN SECTION;  Surgeon: Brock Badharles A Harper, MD;  Location: [facility];  Service: Obstetrics;  Laterality: N/A;   Family History  Problem Relation Age of Onset  . Asthma Neg Hx   . Diabetes Neg Hx   . Heart disease Neg Hx   . Hypertension  Neg Hx   . Kidney disease Neg Hx   . Stroke Neg Hx   . Hearing loss Neg Hx    Social History  Substance Use Topics  . Smoking status: Former Smoker    Types: Cigarettes  . Smokeless tobacco: Never Used     Comment: July 2016  . Alcohol Use: No     Comment: socially; not since +preg   OB History    Gravida Para Term Preterm AB TAB SAB Ectopic Multiple Living   6 3 2 1 3 2 1  0 0 3     Review of Systems  Constitutional: Negative for fever.  HENT: Positive for dental problem. Negative for facial swelling, mouth sores and trouble swallowing.   Respiratory: Negative.   Cardiovascular: Negative.   Gastrointestinal: Negative.   Neurological: Positive for headaches.  All other systems reviewed and are negative.   Allergies  Review of patient's allergies indicates no known allergies.  Home Medications   Prior to Admission medications   Medication Sig Start Date End Date Taking? Authorizing Provider  ibuprofen (ADVIL,MOTRIN) 800 MG tablet Take 1 tablet (800 mg total) by mouth every 8 (eight) hours as needed for mild pain or moderate pain. 09/20/15  Yes Brock Badharles A Harper, MD  betamethasone dipropionate (DIPROLENE) 0.05 % ointment Apply topically 2 (two) times daily. For 2 weeks or until rash has diminished 11/12/15   Audry Piliyler Mohr, PA-C  diphenhydrAMINE (BENADRYL) 25 MG tablet Take 1 tablet (25 mg total) by mouth  every 6 (six) hours as needed. 11/12/15   Audry Piliyler Mohr, PA-C  fluconazole (DIFLUCAN) 100 MG tablet TAKE 1 TABLET (100 MG TOTAL) BY MOUTH ONCE. REPEAT DOSE IN 48-72 HOUR. 12/09/15   Brock Badharles A Harper, MD  medroxyPROGESTERone (DEPO-PROVERA) 150 MG/ML injection Inject 1 mL (150 mg total) into the muscle every 3 (three) months. 12/28/15   Roe Coombsachelle A Denney, CNM  norethindrone-ethinyl estradiol 1/35 (ORTHO-NOVUM 1/35, 28,) tablet Take only active pills 11/20/15   Brock Badharles A Harper, MD  predniSONE (STERAPRED UNI-PAK 21 TAB) 10 MG (21) TBPK tablet Take as directed. 11/07/15   Brock Badharles A Harper, MD    Meds Ordered and Administered this Visit  Medications - No data to display  BP 121/83 mmHg  Pulse 86  Temp(Src) 98 F (36.7 C) (Oral)  Resp 17  SpO2 99% No data found.   Physical Exam  Constitutional: She appears well-developed. No distress.  HENT:  Head: Normocephalic and atraumatic.  Mouth/Throat:    Cardiovascular: Normal rate, regular rhythm and normal heart sounds.   No murmur heard. Pulmonary/Chest: Effort normal and breath sounds normal. No respiratory distress.  Nursing note and vitals reviewed.   ED Course  Procedures (including critical care time)  Labs Review Labs Reviewed - No data to display  Imaging Review No results found.   Visual Acuity Review  Right Eye Distance:   Left Eye Distance:   Bilateral Distance:    Right Eye Near:   Left Eye Near:    Bilateral Near:         MDM  No diagnosis found. Pain, dental  Likely dental fracture. Percocet prescribed for pain. Penicillin for prophylaxis. Diflucan prescribed given hx of yeast infection following any antibiotic treatment per patient. I instructed her to call dental office to schedule follow up as soon as possible. She verbalized understanding.    Doreene ElandKehinde T Ramiyah Mcclenahan, MD 01/05/16 1550

## 2016-01-05 NOTE — ED Notes (Signed)
Patient presents with toothache x2 months she was eating a chicken nugget and when she bit it a tooth broke pt not sure if the broken tooth is on the top or bottom she is in pain. Pt has taken 800 mg of ibuprofen and percocet 5 mg for pain   No acute distress

## 2016-01-05 NOTE — Discharge Instructions (Signed)
Please call dental office as soon as possible to schedule follow up[ appointment.Dental Pain Dental pain may be caused by many things, including:  Tooth decay (cavities or caries). Cavities cause the nerve of your tooth to be open to air and hot or cold temperatures. This can cause pain or discomfort.  Abscess or infection. A dental abscess is an area that is full of infected pus from a bacterial infection in the inner part of the tooth (pulp). It usually happens at the end of the tooth's root.  Injury.  An unknown reason (idiopathic). Your pain may be mild or severe. It may only happen when:  You are chewing.  You are exposed to hot or cold temperature.  You are eating or drinking sugary foods or beverages, such as:  Soda.  Candy. Your pain may also be there all of the time. HOME CARE Watch your dental pain for any changes. Do these things to lessen your discomfort:  Take medicines only as told by your dentist.  If your dentist tells you to take an antibiotic medicine, finish all of it even if you start to feel better.  Keep all follow-up visits as told by your dentist. This is important.  Do not apply heat to the outside of your face.  Rinse your mouth or gargle with salt water if told by your dentist. This helps with pain and swelling.  You can make salt water by adding  tsp of salt to 1 cup of warm water.  Apply ice to the painful area of your face:  Put ice in a plastic bag.  Place a towel between your skin and the bag.  Leave the ice on for 20 minutes, 2-3 times per day.  Avoid foods or drinks that cause you pain, such as:  Very hot or very cold foods or drinks.  Sweet or sugary foods or drinks. GET HELP IF:  Your pain is not helped with medicines.  Your symptoms are worse.  You have new symptoms. GET HELP RIGHT AWAY IF:  You cannot open your mouth.  You are having trouble breathing or swallowing.  You have a fever.  Your face, neck, or jaw is  puffy (swollen).   This information is not intended to replace advice given to you by your health care provider. Make sure you discuss any questions you have with your health care provider.   Document Released: 12/17/2007 Document Revised: 11/14/2014 Document Reviewed: 06/26/2014 [employer] Interactive Patient Education [employer]2016 [employer]

## 2016-03-25 ENCOUNTER — Ambulatory Visit (INDEPENDENT_AMBULATORY_CARE_PROVIDER_SITE_OTHER): Payer: Medicaid Other | Admitting: Certified Nurse Midwife

## 2016-03-25 ENCOUNTER — Encounter: Payer: Self-pay | Admitting: Certified Nurse Midwife

## 2016-03-25 VITALS — BP 110/78 | HR 90 | Ht 67.0 in | Wt 184.0 lb

## 2016-03-25 DIAGNOSIS — Z3049 Encounter for surveillance of other contraceptives: Secondary | ICD-10-CM | POA: Diagnosis not present

## 2016-03-25 DIAGNOSIS — B9689 Other specified bacterial agents as the cause of diseases classified elsewhere: Secondary | ICD-10-CM

## 2016-03-25 DIAGNOSIS — Z113 Encounter for screening for infections with a predominantly sexual mode of transmission: Secondary | ICD-10-CM

## 2016-03-25 DIAGNOSIS — B373 Candidiasis of vulva and vagina: Secondary | ICD-10-CM

## 2016-03-25 DIAGNOSIS — N760 Acute vaginitis: Secondary | ICD-10-CM

## 2016-03-25 DIAGNOSIS — B3731 Acute candidiasis of vulva and vagina: Secondary | ICD-10-CM

## 2016-03-25 DIAGNOSIS — Z01419 Encounter for gynecological examination (general) (routine) without abnormal findings: Secondary | ICD-10-CM

## 2016-03-25 MED ORDER — MEDROXYPROGESTERONE ACETATE 150 MG/ML IM SUSP
150.0000 mg | Freq: Once | INTRAMUSCULAR | Status: AC
  Administered 2016-03-25: 150 mg via INTRAMUSCULAR

## 2016-03-25 MED ORDER — METRONIDAZOLE 500 MG PO TABS: 500.0000 mg | ORAL_TABLET | Freq: Two times a day (BID) | ORAL | 0 refills | Status: DC

## 2016-03-25 MED ORDER — FLUCONAZOLE 200 MG PO TABS: 200.0000 mg | ORAL_TABLET | Freq: Once | ORAL | 0 refills | Status: DC

## 2016-03-25 NOTE — Progress Notes (Signed)
Subjective:        Amy Davies is a 30 y.o. female here for a routine exam.  Current complaints: none.    Personal health questionnaire:  Is patient [name] Jewish, have a family history of breast and/or ovarian cancer: no Is there a family history of uterine cancer diagnosed at age < 26, gastrointestinal cancer, urinary tract cancer, family member who is a Personnel officer syndrome-associated carrier: no Is the patient overweight and hypertensive, family history of diabetes, personal history of gestational diabetes, preeclampsia or PCOS: no Is patient over 45, have PCOS,  family history of premature CHD under age 64, diabetes, smoke, have hypertension or peripheral artery disease:  no At any time, has a partner hit, kicked or otherwise hurt or frightened you?: no Over the past 2 weeks, have you felt down, depressed or hopeless?: no Over the past 2 weeks, have you felt little interest or pleasure in doing things?:no   Gynecologic History No LMP recorded. Patient has had an injection. Contraception: Depo-Provera injections Last Pap: 03/22/15. Results were: normal Last mammogram: N/A.    Obstetric History OB History  Gravida Para Term Preterm AB Living  6 3 2 1 3 3   SAB TAB Ectopic Multiple Live Births  1 2 0 0 3    # Outcome Date GA Lbr Len/2nd Weight Sex Delivery Anes PTL Lv  6 Preterm 09/06/15 [redacted]w[redacted]d  4 lb 12.5 oz (2.17 kg) F CS-LTranv Spinal  LIV  5 Term 07/17/10 [redacted]w[redacted]d  8 lb (3.629 kg) M Vag-Spont EPI N LIV  4 SAB 2009 [redacted]w[redacted]d    SAB     3 Term 04/12/03 [redacted]w[redacted]d  7 lb 9 oz (3.43 kg) M Vag-Spont EPI N LIV  2 TAB           1 TAB               Past Medical History:  Diagnosis Date  . Irritable bowel syndrome     Past Surgical History:  Procedure Laterality Date  . CESAREAN SECTION N/A 09/06/2015   Procedure: CESAREAN SECTION;  Surgeon: Brock Bad, MD;  Location: [facility];  Service: Obstetrics;  Laterality: N/A;  . INDUCED ABORTION    . NO PAST SURGERIES       Current  Outpatient Prescriptions:  .  medroxyPROGESTERone (DEPO-PROVERA) 150 MG/ML injection, Inject 1 mL (150 mg total) into the muscle every 3 (three) months., Disp: 1 mL, Rfl: 2 .  betamethasone dipropionate (DIPROLENE) 0.05 % ointment, Apply topically 2 (two) times daily. For 2 weeks or until rash has diminished (Patient not taking: Reported on 03/25/2016), Disp: 30 g, Rfl: 0 .  diphenhydrAMINE (BENADRYL) 25 MG tablet, Take 1 tablet (25 mg total) by mouth every 6 (six) hours as needed. (Patient not taking: Reported on 03/25/2016), Disp: 20 tablet, Rfl: 0 .  fluconazole (DIFLUCAN) 200 MG tablet, Take 1 tablet (200 mg total) by mouth once. Repeat dose in 48-72 hours., Disp: 3 tablet, Rfl: 0 .  ibuprofen (ADVIL,MOTRIN) 800 MG tablet, Take 1 tablet (800 mg total) by mouth every 8 (eight) hours as needed for mild pain or moderate pain. (Patient not taking: Reported on 03/25/2016), Disp: 60 tablet, Rfl: 5 .  metroNIDAZOLE (FLAGYL) 500 MG tablet, Take 1 tablet (500 mg total) by mouth 2 (two) times daily., Disp: 14 tablet, Rfl: 0 .  norethindrone-ethinyl estradiol 1/35 (ORTHO-NOVUM 1/35, 28,) tablet, Take only active pills (Patient not taking: Reported on 03/25/2016), Disp: 1 Package, Rfl: 0 .  oxyCODONE-acetaminophen (  PERCOCET/ROXICET) 5-325 MG tablet, Take 2 tablets by mouth every 4 (four) hours as needed for severe pain. (Patient not taking: Reported on 03/25/2016), Disp: 10 tablet, Rfl: 0 .  predniSONE (STERAPRED UNI-PAK 21 TAB) 10 MG (21) TBPK tablet, Take as directed. (Patient not taking: Reported on 03/25/2016), Disp: 21 tablet, Rfl: 0 No Known Allergies  Social History  Substance Use Topics  . Smoking status: Former Smoker    Types: Cigarettes  . Smokeless tobacco: Never Used     Comment: July 2016  . Alcohol use 0.0 oz/week     Comment: socially; not since +preg    Family History  Problem Relation Age of Onset  . Asthma Neg Hx   . Diabetes Neg Hx   . Heart disease Neg Hx   . Hypertension Neg Hx   .  Kidney disease Neg Hx   . Stroke Neg Hx   . Hearing loss Neg Hx       Review of Systems  Constitutional: negative for fatigue and weight loss Respiratory: negative for cough and wheezing Cardiovascular: negative for chest pain, fatigue and palpitations Gastrointestinal: negative for abdominal pain and change in bowel habits Musculoskeletal:negative for myalgias Neurological: negative for gait problems and tremors Behavioral/Psych: negative for abusive relationship, depression Endocrine: negative for temperature intolerance   Genitourinary:negative for abnormal menstrual periods, genital lesions, hot flashes, sexual problems and vaginal discharge Integument/breast: negative for breast lump, breast tenderness, nipple discharge and skin lesion(s)    Objective:       BP 110/78   Pulse 90   Ht 5\' 7"  (1.702 m)   Wt 184 lb (83.5 kg)   BMI 28.82 kg/m  General:   alert  Skin:   no rash or abnormalities  Lungs:   clear to auscultation bilaterally  Heart:   regular rate and rhythm, S1, S2 normal, no murmur, click, rub or gallop  Breasts:   normal without suspicious masses, skin or nipple changes or axillary nodes  Abdomen:  normal findings: no organomegaly, soft, non-tender and no hernia  Pelvis:  External genitalia: normal general appearance Urinary system: urethral meatus normal and bladder without fullness, nontender Vaginal: normal without tenderness, induration or masses Cervix: normal appearance Adnexa: normal bimanual exam Uterus: anteverted and non-tender, normal size   Lab Review Urine pregnancy test Labs reviewed yes Radiologic studies reviewed no  50% of 30 min visit spent on counseling and coordination of care.   Assessment:    Healthy female exam.   Contraception management  yeast vaginitis  BV  STD screening exam  Plan:    Education reviewed: calcium supplements, depression evaluation, low fat, low cholesterol diet, safe sex/STD prevention, self breast  exams, skin cancer screening and weight bearing exercise. Contraception: Depo-Provera injections. Follow up in: 1 year.   Meds ordered this encounter  Medications  . metroNIDAZOLE (FLAGYL) 500 MG tablet    Sig: Take 1 tablet (500 mg total) by mouth 2 (two) times daily.    Dispense:  14 tablet    Refill:  0  . fluconazole (DIFLUCAN) 200 MG tablet    Sig: Take 1 tablet (200 mg total) by mouth once. Repeat dose in 48-72 hours.    Dispense:  3 tablet    Refill:  0  . medroxyPROGESTERone (DEPO-PROVERA) injection 150 mg   Orders Placed This Encounter  Procedures  . CBC with Differential/Platelet  . Comprehensive metabolic panel  . TSH  . Hepatitis B surface antigen  . RPR  . Hepatitis C antibody  .  HIV antibody    Possible management options include: [facility] Follow up as needed.

## 2016-03-25 NOTE — Progress Notes (Signed)
Pt was given depo injection at today's visit since due. Pt tolerated injection well. Pt advised to RTO on 06/16/16 for next injection.  Administrations This Visit    medroxyPROGESTERone (DEPO-PROVERA) injection 150 mg    Admin Date 03/25/2016 Action Given Dose 150 mg Route Intramuscular Administered By Lanney GinsSuzanne D Bowie Delia, CMA

## 2016-03-26 LAB — COMPREHENSIVE METABOLIC PANEL
ALT: 10 IU/L (ref 0–32)
AST: 17 IU/L (ref 0–40)
Albumin/Globulin Ratio: 1.4 (ref 1.2–2.2)
Albumin: 4.2 g/dL (ref 3.5–5.5)
Alkaline Phosphatase: 52 IU/L (ref 39–117)
BUN / CREAT RATIO: 11 (ref 9–23)
BUN: 11 mg/dL (ref 6–20)
CALCIUM: 9.3 mg/dL (ref 8.7–10.2)
CO2: 23 mmol/L (ref 18–29)
Chloride: 104 mmol/L (ref 96–106)
Creatinine, Ser: 0.97 mg/dL (ref 0.57–1.00)
GFR calc Af Amer: 89 mL/min/{1.73_m2} (ref >59)
GFR, EST NON AFRICAN AMERICAN: 78 mL/min/{1.73_m2} (ref >59)
GLUCOSE: 79 mg/dL (ref 65–99)
Globulin, Total: 2.9 g/dL (ref 1.5–4.5)
Potassium: 4.1 mmol/L (ref 3.5–5.2)
SODIUM: 142 mmol/L (ref 134–144)
TOTAL PROTEIN: 7.1 g/dL (ref 6.0–8.5)

## 2016-03-26 LAB — CBC WITH DIFFERENTIAL/PLATELET
BASOS ABS: 0 10*3/uL (ref 0.0–0.2)
Basos: 0 %
EOS (ABSOLUTE): 0 10*3/uL (ref 0.0–0.4)
Eos: 0 %
Hematocrit: 43.3 % (ref 34.0–46.6)
Hemoglobin: 14.3 g/dL (ref 11.1–15.9)
IMMATURE GRANS (ABS): 0 10*3/uL (ref 0.0–0.1)
IMMATURE GRANULOCYTES: 0 %
LYMPHS: 42 %
Lymphocytes Absolute: 3.5 10*3/uL — ABNORMAL HIGH (ref 0.7–3.1)
MCH: 31 pg (ref 26.6–33.0)
MCHC: 33 g/dL (ref 31.5–35.7)
MCV: 94 fL (ref 79–97)
MONOS ABS: 0.6 10*3/uL (ref 0.1–0.9)
Monocytes: 7 %
NEUTROS PCT: 51 %
Neutrophils Absolute: 4.1 10*3/uL (ref 1.4–7.0)
PLATELETS: 282 10*3/uL (ref 150–379)
RBC: 4.61 x10E6/uL (ref 3.77–5.28)
RDW: 13.4 % (ref 12.3–15.4)
WBC: 8.2 10*3/uL (ref 3.4–10.8)

## 2016-03-26 LAB — TSH: TSH: 1.14 u[IU]/mL (ref 0.450–4.500)

## 2016-03-26 LAB — HEPATITIS B SURFACE ANTIGEN: Hepatitis B Surface Ag: NEGATIVE

## 2016-03-26 LAB — RPR: RPR Ser Ql: NONREACTIVE

## 2016-03-26 LAB — HEPATITIS C ANTIBODY: Hep C Virus Ab: <0.1 s/co ratio (ref 0.0–0.9)

## 2016-03-26 LAB — HIV ANTIBODY (ROUTINE TESTING W REFLEX): HIV SCREEN 4TH GENERATION: NONREACTIVE

## 2016-03-28 LAB — PAP IG AND HPV HIGH-RISK
HPV, HIGH-RISK: NEGATIVE
PAP Smear Comment: 0

## 2016-03-30 LAB — NUSWAB VG+, CANDIDA 6SP
CANDIDA LUSITANIAE, NAA: NEGATIVE
CANDIDA PARAPSILOSIS, NAA: NEGATIVE
CANDIDA TROPICALIS, NAA: NEGATIVE
Candida albicans, NAA: NEGATIVE
Candida glabrata, NAA: NEGATIVE
Candida krusei, NAA: NEGATIVE
Chlamydia trachomatis, NAA: NEGATIVE
NEISSERIA GONORRHOEAE, NAA: NEGATIVE
Trich vag by NAA: NEGATIVE

## 2016-04-16 ENCOUNTER — Other Ambulatory Visit: Payer: Self-pay | Admitting: *Deleted

## 2016-04-16 DIAGNOSIS — B373 Candidiasis of vulva and vagina: Secondary | ICD-10-CM

## 2016-04-16 DIAGNOSIS — B3731 Acute candidiasis of vulva and vagina: Secondary | ICD-10-CM

## 2016-04-16 MED ORDER — FLUCONAZOLE 200 MG PO TABS: 200.0000 mg | ORAL_TABLET | Freq: Once | ORAL | 0 refills | Status: DC

## 2016-05-26 IMAGING — US US OB TRANSVAGINAL
1 series · 13 of 28 positions shown · non-contrast
Comparison: None.

CLINICAL DATA: 29-year-old pregnant female with uncertain LMP,
presenting for assessment of fetal dating and viability.

EXAM:
OBSTETRIC <14 WK US AND TRANSVAGINAL OB US
TECHNIQUE: Both transabdominal and transvaginal ultrasound examinations were
performed for complete evaluation of the gestation as well as the
maternal uterus, adnexal regions, and pelvic cul-de-sac.
Transvaginal technique was performed to assess early pregnancy.

[Series 1: us ob comp less 14 wk · 13 of 55 slices shown]
[im 3/55]
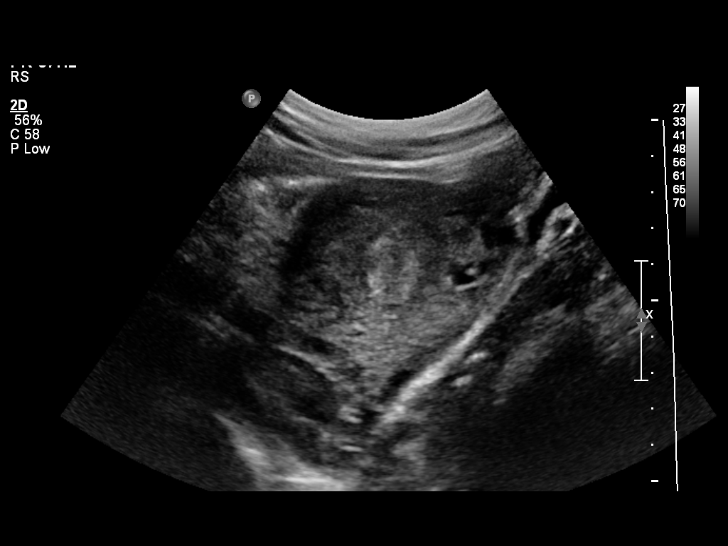
[im 7/55]
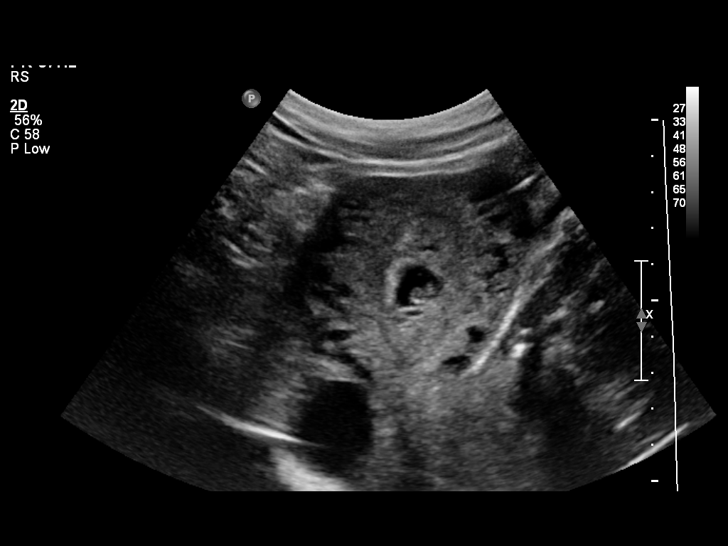
[im 11/55]
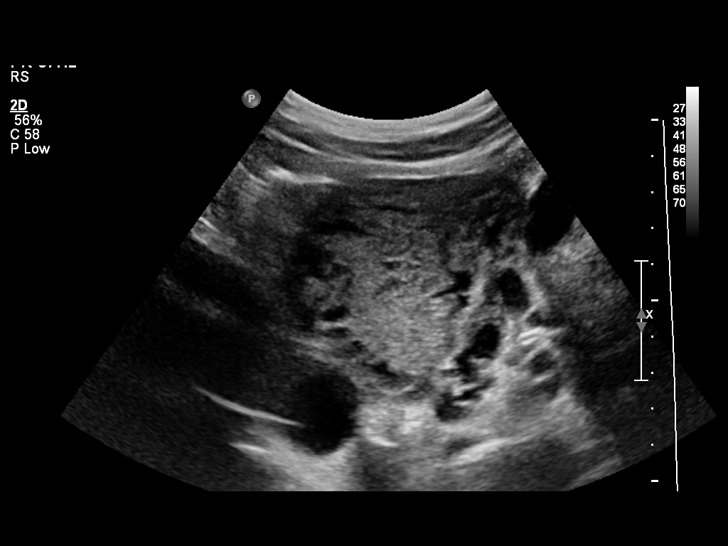
[im 15/55]
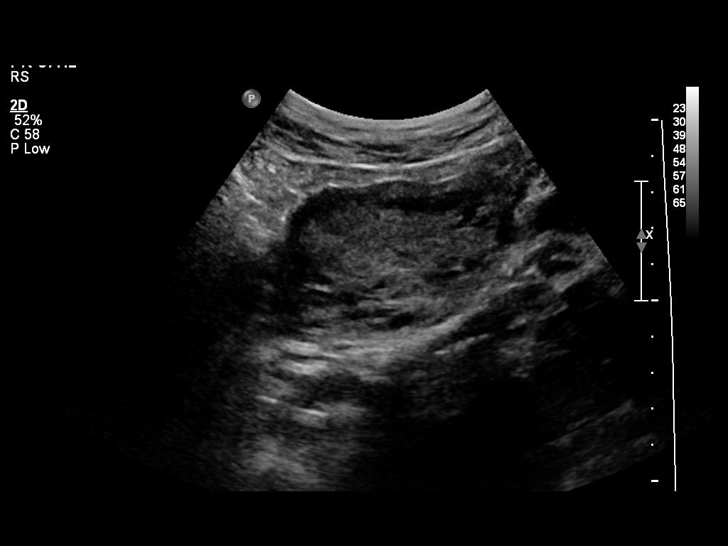
[im 19/55]
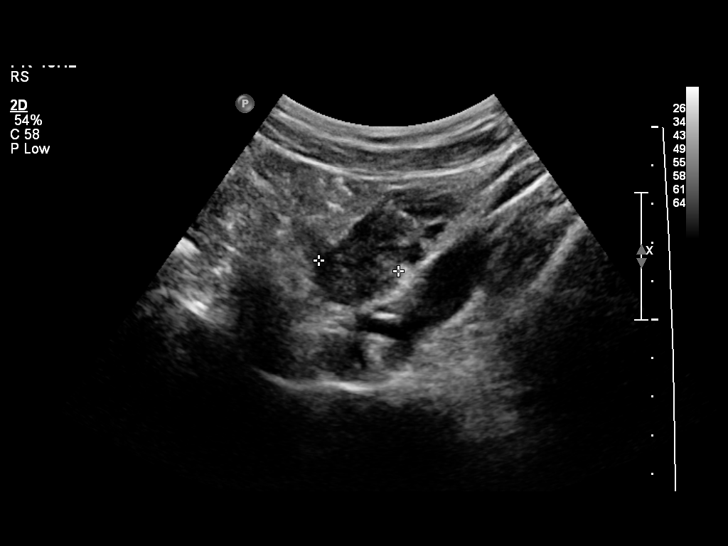
[im 23/55]
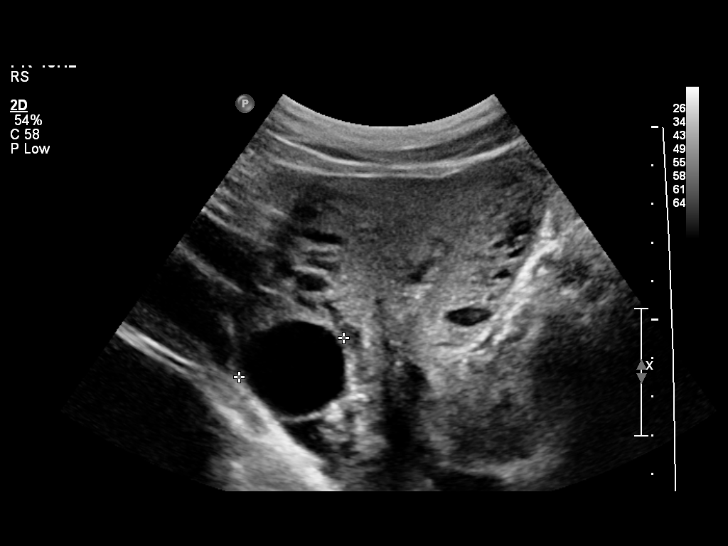
[im 29/55]
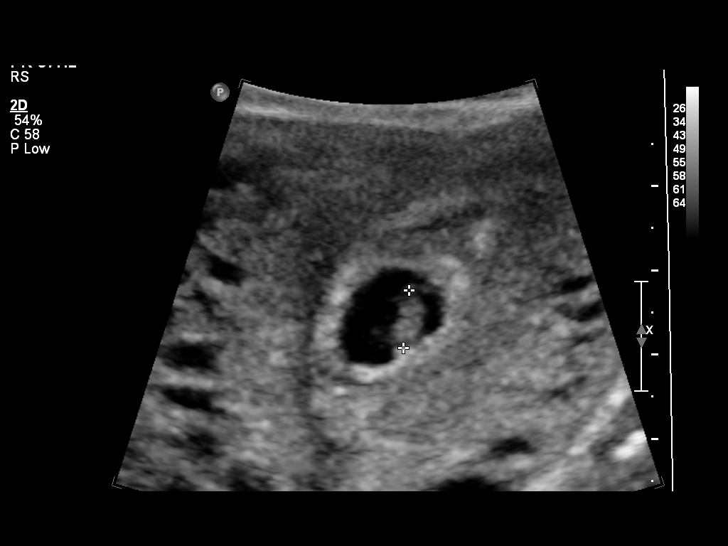
[im 33/55]
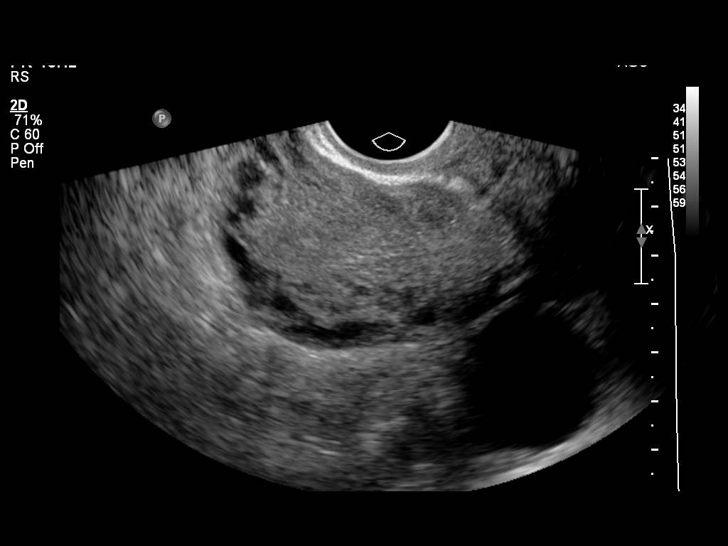
[im 37/55]
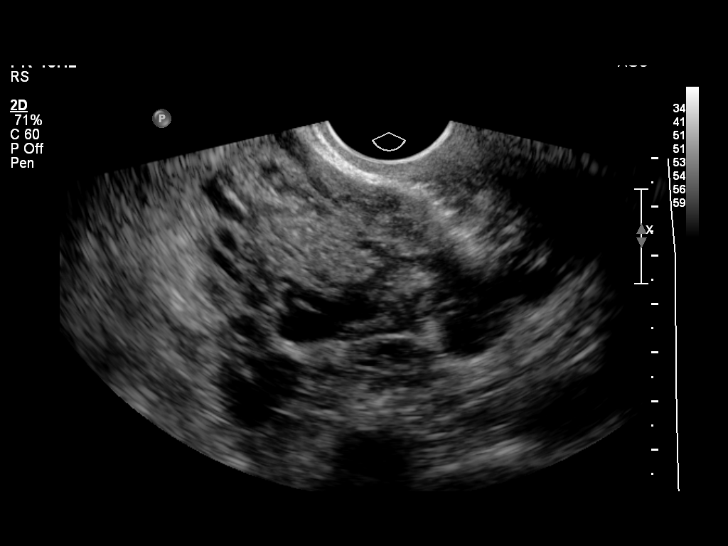
[im 41/55]
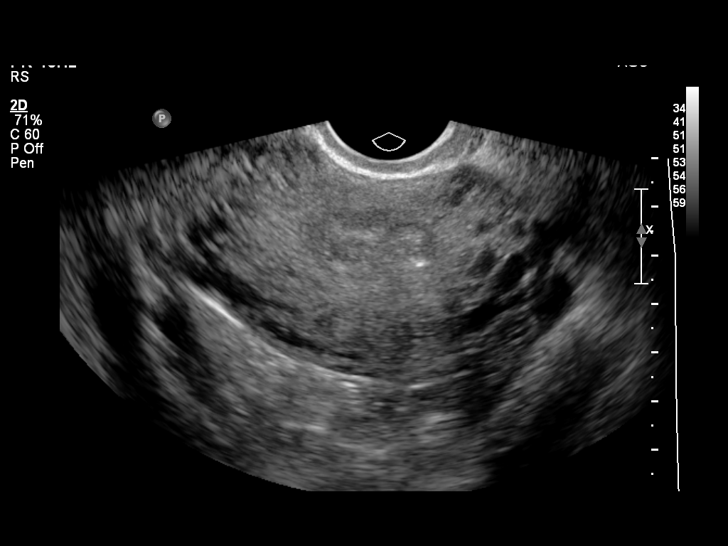
[im 45/55]
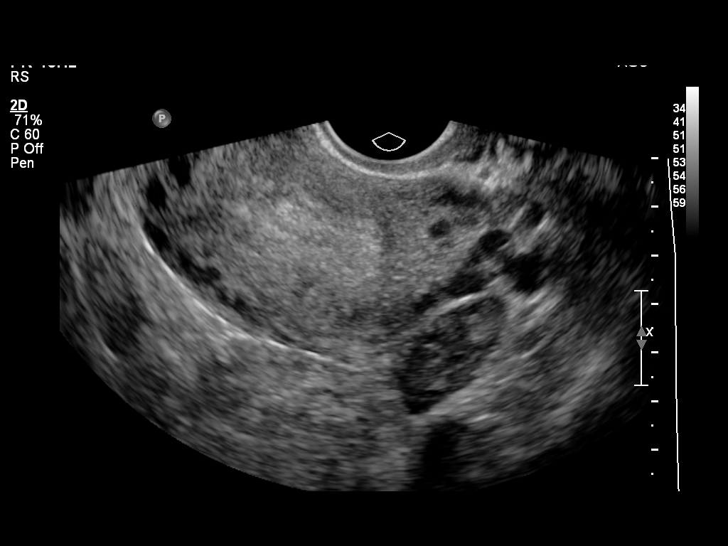
[im 49/55]
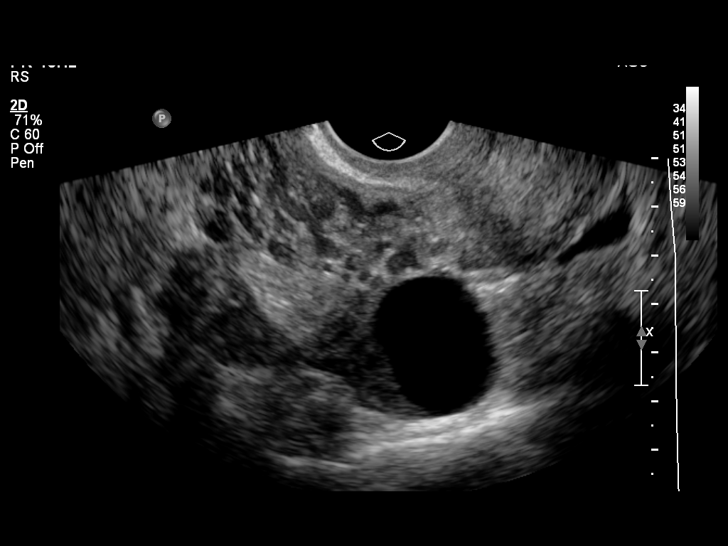
[im 53/55]
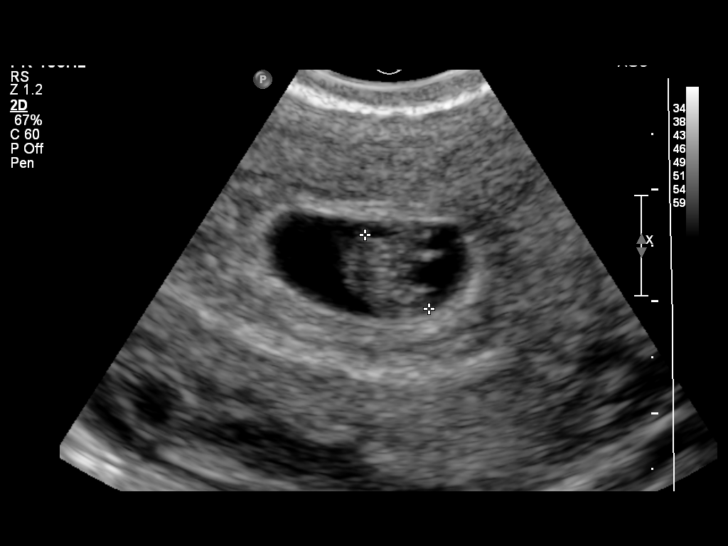

[13 of 28 positions shown; findings below may reference images not displayed]

FINDINGS: Intrauterine gestational sac: Visualized/normal in shape. No
perigestational bleed. No uterine fibroids.

Yolk sac:  Present and normal.

Embryo:  Present.

Cardiac Activity: Regular rate and rhythm.

Heart Rate: 134  bpm

CRL:  8  mm   6 w   6 d                  US EDC: 10/18/2015

Maternal uterus/adnexae: Maternal left ovary measures 2.8 x 1.4 x
1.3 cm and is normal. Maternal right ovary measures 3.5 x 3.2 x
cm and contains a simple 3.0 cm cyst likely representing a corpus
luteum. No suspicious ovarian or adnexal masses. No abnormal free
fluid in the pelvis.
IMPRESSION: 1. Single living intrauterine gestation at 6 weeks 6 days by
crown-rump length.
2. Normal embryonic cardiac activity.
3. No first-trimester gestational abnormality detected.

## 2016-06-16 ENCOUNTER — Ambulatory Visit: Payer: Medicaid Other

## 2016-06-19 ENCOUNTER — Ambulatory Visit (INDEPENDENT_AMBULATORY_CARE_PROVIDER_SITE_OTHER): Payer: Medicaid Other

## 2016-06-19 VITALS — BP 113/70 | HR 85 | Wt 177.4 lb

## 2016-06-19 DIAGNOSIS — Z3042 Encounter for surveillance of injectable contraceptive: Secondary | ICD-10-CM

## 2016-06-19 MED ORDER — MEDROXYPROGESTERONE ACETATE 150 MG/ML IM SUSP
150.0000 mg | Freq: Once | INTRAMUSCULAR | Status: AC
  Administered 2016-06-19: 150 mg via INTRAMUSCULAR

## 2016-06-19 NOTE — Progress Notes (Signed)
Patient is in the office for depo injection, administered and pt tolerated well .. Administrations This Visit    medroxyPROGESTERone (DEPO-PROVERA) injection 150 mg    Admin Date 06/19/2016 Action Given Dose 150 mg Route Intramuscular Administered By Brittany D Stalling, RN         

## 2016-09-08 ENCOUNTER — Other Ambulatory Visit: Payer: Self-pay | Admitting: Certified Nurse Midwife

## 2016-09-08 ENCOUNTER — Telehealth: Payer: Self-pay

## 2016-09-08 ENCOUNTER — Ambulatory Visit: Payer: Medicaid Other

## 2016-09-08 DIAGNOSIS — Z30013 Encounter for initial prescription of injectable contraceptive: Secondary | ICD-10-CM

## 2016-09-08 NOTE — Telephone Encounter (Signed)
Returned call, and advised patient of pharmacy where rx was sent, patient stated that she is now using [facility], advised how to transfer rx and to call back if any issues.

## 2016-09-12 ENCOUNTER — Other Ambulatory Visit: Payer: Self-pay | Admitting: Certified Nurse Midwife

## 2016-09-12 DIAGNOSIS — B373 Candidiasis of vulva and vagina: Secondary | ICD-10-CM

## 2016-09-12 DIAGNOSIS — B3731 Acute candidiasis of vulva and vagina: Secondary | ICD-10-CM

## 2016-09-13 ENCOUNTER — Other Ambulatory Visit: Payer: Self-pay | Admitting: Obstetrics

## 2016-09-13 DIAGNOSIS — B373 Candidiasis of vulva and vagina: Secondary | ICD-10-CM

## 2016-09-13 DIAGNOSIS — B3731 Acute candidiasis of vulva and vagina: Secondary | ICD-10-CM

## 2016-09-13 MED ORDER — FLUCONAZOLE 200 MG PO TABS: 200.0000 mg | ORAL_TABLET | ORAL | 0 refills | Status: DC

## 2016-09-15 ENCOUNTER — Ambulatory Visit: Payer: Medicaid Other

## 2016-09-16 ENCOUNTER — Telehealth: Payer: Self-pay

## 2016-09-16 DIAGNOSIS — Z30013 Encounter for initial prescription of injectable contraceptive: Secondary | ICD-10-CM

## 2016-09-16 NOTE — Telephone Encounter (Signed)
Spoke with patient and advised of rx sent, patient wanted to get depo rx transferred to [facility] on [address], contacted pharmacy for transfer.

## 2016-09-17 ENCOUNTER — Ambulatory Visit (INDEPENDENT_AMBULATORY_CARE_PROVIDER_SITE_OTHER): Payer: Medicaid Other

## 2016-09-17 VITALS — BP 137/92 | HR 90 | Wt 182.0 lb

## 2016-09-17 DIAGNOSIS — Z309 Encounter for contraceptive management, unspecified: Secondary | ICD-10-CM | POA: Diagnosis not present

## 2016-09-17 DIAGNOSIS — Z308 Encounter for other contraceptive management: Secondary | ICD-10-CM

## 2016-09-17 DIAGNOSIS — Z30013 Encounter for initial prescription of injectable contraceptive: Secondary | ICD-10-CM

## 2016-09-17 MED ORDER — MEDROXYPROGESTERONE ACETATE 150 MG/ML IM SUSP: INTRAMUSCULAR | 2 refills | Status: DC

## 2016-09-17 MED ORDER — MEDROXYPROGESTERONE ACETATE 150 MG/ML IM SUSP
150.0000 mg | Freq: Once | INTRAMUSCULAR | Status: AC
  Administered 2016-09-17: 150 mg via INTRAMUSCULAR

## 2016-09-17 NOTE — Addendum Note (Signed)
Addended by: Maretta BeesMCGLASHAN, Deborh Pense J on: 09/17/2016 02:47 PM   Modules accepted: Orders

## 2016-09-17 NOTE — Progress Notes (Signed)
Patient presents for DEPO Injection. Shot given in Right Deltoid. Administrations This Visit    medroxyPROGESTERone (DEPO-PROVERA) injection 150 mg    Admin Date 09/17/2016 Action Given Dose 150 mg Route Intramuscular Administered By Maretta Beesarol J Victorine Mcnee, RMA

## 2016-11-04 IMAGING — US US MFM OB FOLLOW-UP
1 series · 14 of 28 positions shown · non-contrast
Comparison: none

[Series 1: us mfm ob follow-up · 14 of 69 slices shown]
[im 3/69]
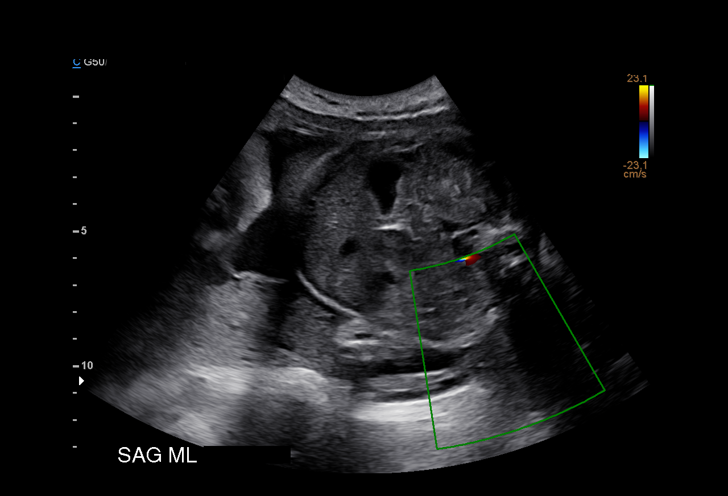
[im 8/69]
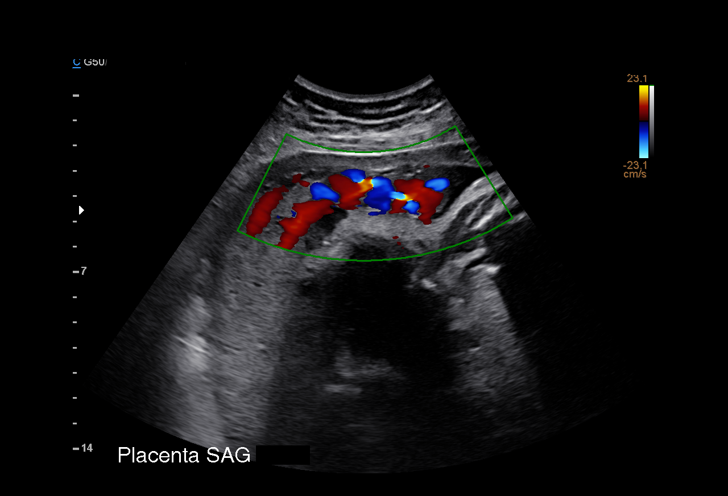
[im 13/69]
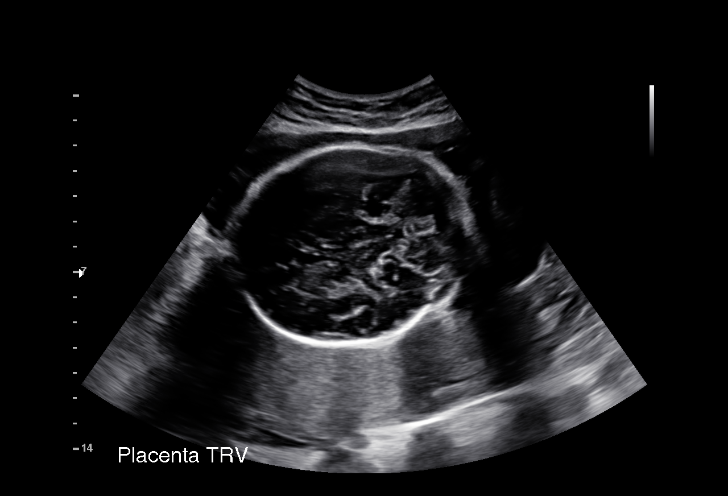
[im 18/69]
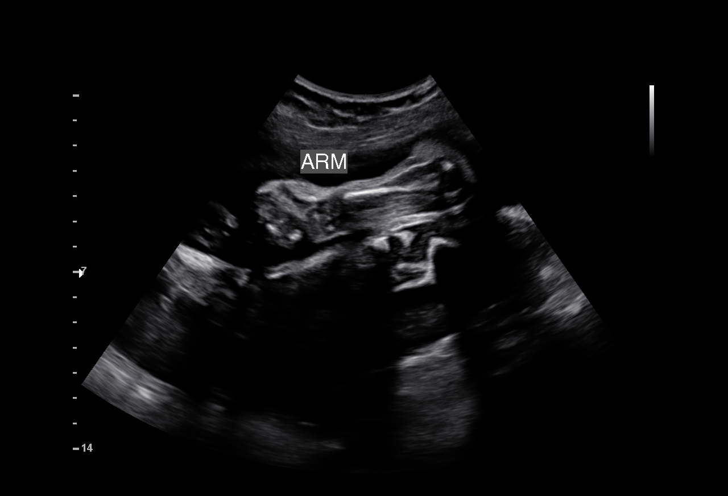
[im 23/69]
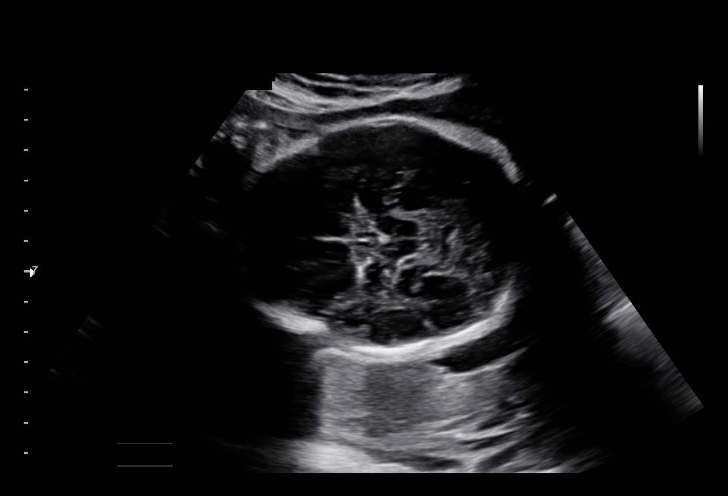
[im 28/69]
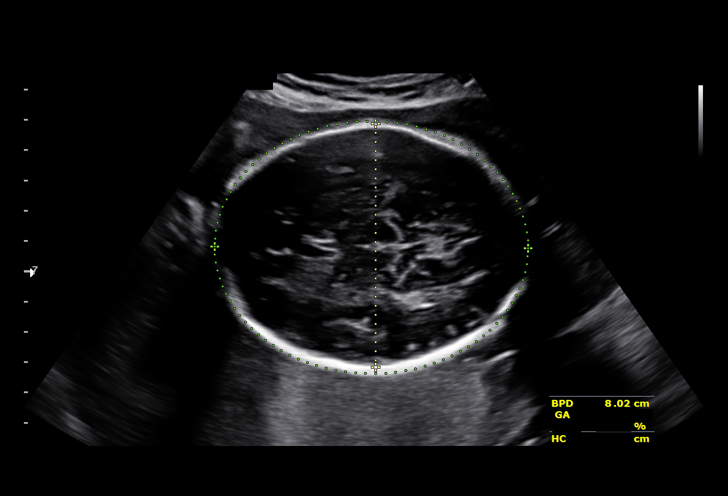
[im 33/69]
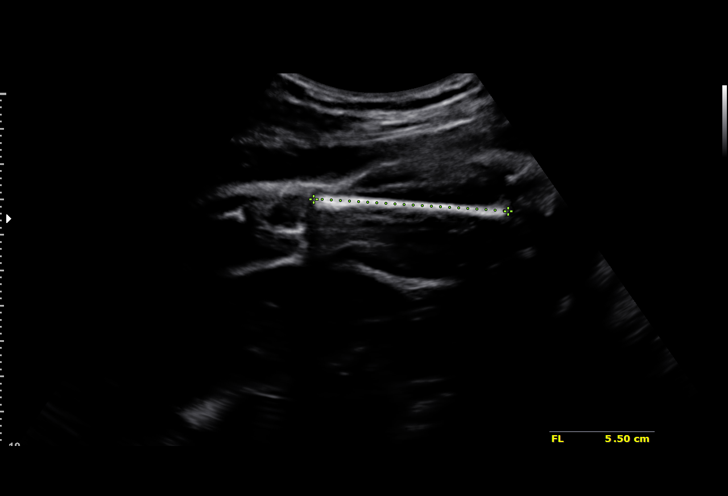
[im 38/69]
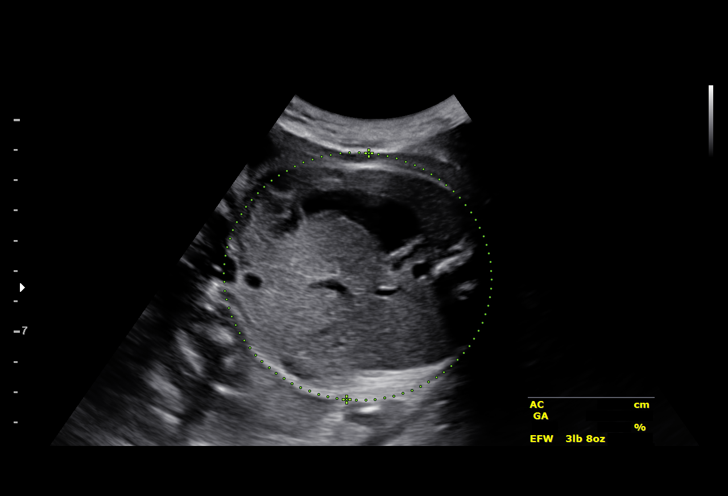
[im 43/69]
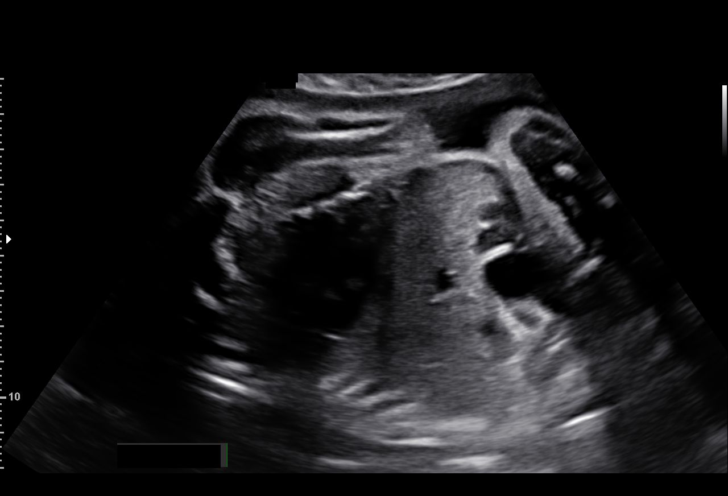
[im 48/69]
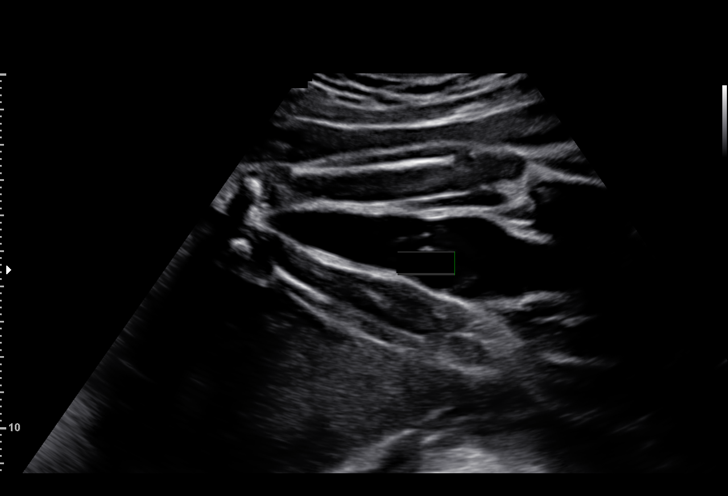
[im 53/69]
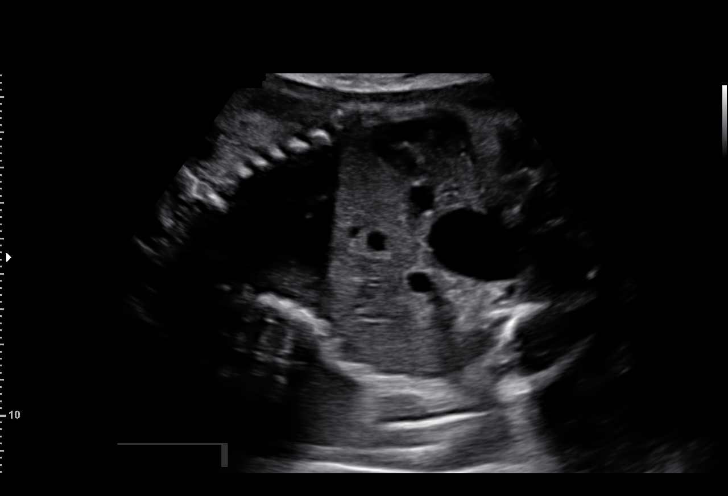
[im 58/69]
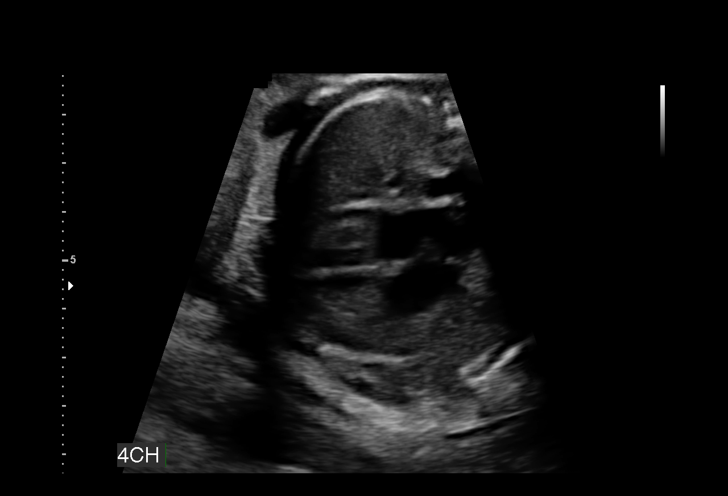
[im 63/69]
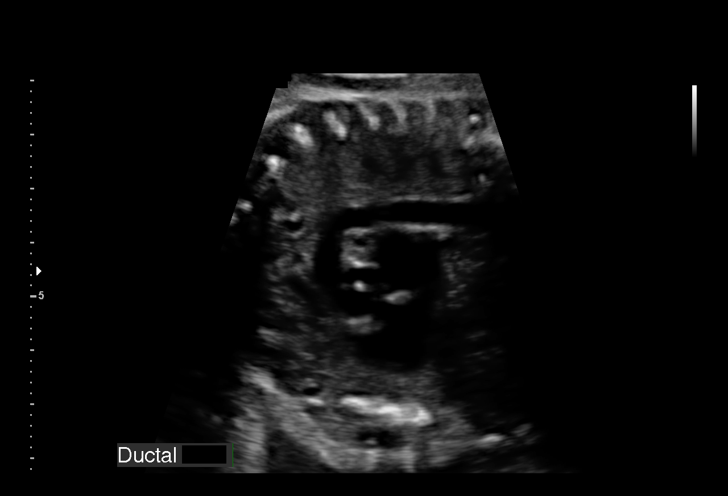
[im 69/69]
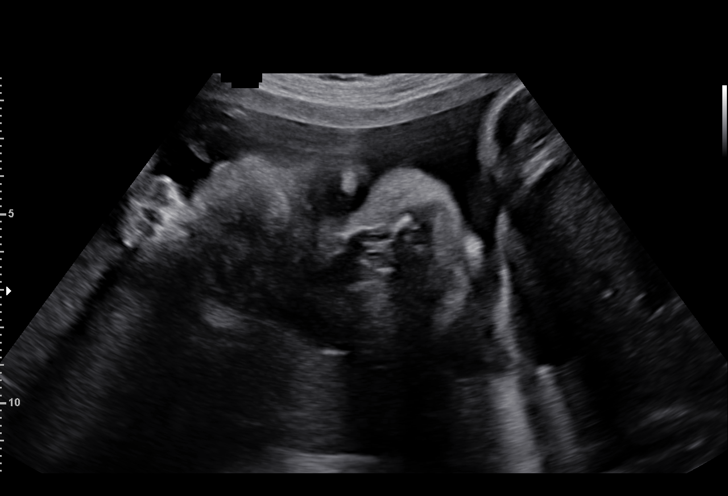

[14 of 28 positions shown; findings below may reference images not displayed]

am)

Date:

Indications

Velamentous insertion of umbilical cord
Maternal care for suspected fetal
abnormality and damage
Placental abnormality: Other
30 weeks gestation of pregnancy
OB History

Gravidity:     6         Term:  2        Prem:    0        SAB:   1
TOP:           2       Ectopic  0        Living:  2
:
Fetal Evaluation

Num Of Fetuses:      1
Fetal Heart          141
Rate(bpm):
Cardiac Activity:    Observed
Presentation:        Breech
Placenta:            Posterior Fundal, above cervical os
P. Cord Insertion:   Velamentous insertion at fund

Amniotic Fluid
AFI FV:      Subjectively low-normal
AFI Sum:     8.24     cm       4  %Tile     Larg Pckt:    3.18   cm
RUQ:   2.01    cm    LUQ:   3.18    cm   LLQ:    3.05    cm
Biometry

BPD:      79.6  mm     G. Age:   32w 0d                  CI:        72.39   %    70 - 86
FL/HC:      18.5   %    19.3 -
HC:      297.6  mm     G. Age:   33w 0d        72   %    HC/AC:      1.14        0.96 -
AC:      260.5  mm     G. Age:   30w 1d        27   %    FL/BPD      69.2   %    71 - 87
:
FL:       55.1  mm     G. Age:   29w 0d          5  %    FL/AC:      21.2   %    20 - 24
HUM:      49.8  mm     G. Age:   29w 2d        17   %
CER:      37.7  mm     G. Age:   32w 2d        71   %
LV:        4.9  mm

Est.        9607   gm    3 lb 6 oz      42   %
FW:
Gestational Age

LMP:           30w 6d        Date:  01/05/15                  EDD:   10/12/15
U/S Today:     31w 0d                                         EDD:   10/11/15
Best:          30w 6d    Det. By:   LMP  (01/05/15)           EDD:   10/12/15
Anatomy

Cranium:          Appears normal         Aortic Arch:       Appears normal
Fetal Cavum:      Appears normal         Ductal Arch:       Appears normal
Ventricles:       Appears normal         Diaphragm:         Appears normal
Choroid Plexus:   Previously seen        Stomach:           Appears normal,
left sided
Cerebellum:       Appears normal         Abdomen:           Appears normal
Posterior         Previously seen        Abdominal          Appears nml (cord
Fossa:                                   Wall:              insert, abd wall)
Nuchal Fold:      Not applicable (>20    Cord Vessels:      Appears normal (3
wks GA)                                   vessel cord)
Face:             Orbits and profile     Kidneys:           Appear normal
previously seen
Lips:             Appears normal         Bladder:           Appears normal
Fetal Thoracic:   Appears normal         Spine:             Previously seen
Heart:            Appears normal         Upper              Previously seen
(4CH, axis, and        Extremities:
situs)
RVOT:             Appears normal         Lower              Previously seen
Extremities:
LVOT:             Appears normal

Other:   Female gender previously seen. Heels and 5th digit previously
visualized. Nasal bone previously visualized.
Cervix Uterus Adnexa

Cervix
Length:             2.8  cm.
Normal appearance by transabdominal scan.

Left Ovary
Previously seen.
Right Ovary
Previously seen
Impression

Single IUP at 30w 6d
Follow up due to velamentous cord insertion (posterior
fundal placenta)
Interval growth is appropriate (42nd %tile)
The FL measures < 5th %tile, but appears normal
morphologically - likely constitutional
The amniotic fluid volume is low-normal (AFI 8.2 cm)
Recommendations

Recommend follow up ultrasound in 4 weeks for growth
Limited ultrasound next week for AFI

## 2016-11-10 IMAGING — US US MFM OB LIMITED
1 series · 13 of 19 positions shown · non-contrast
Comparison: none

[Series 1: us mfm ob limited · 0.23mm/px · 13 of 19 slices shown]
[im 1/19]
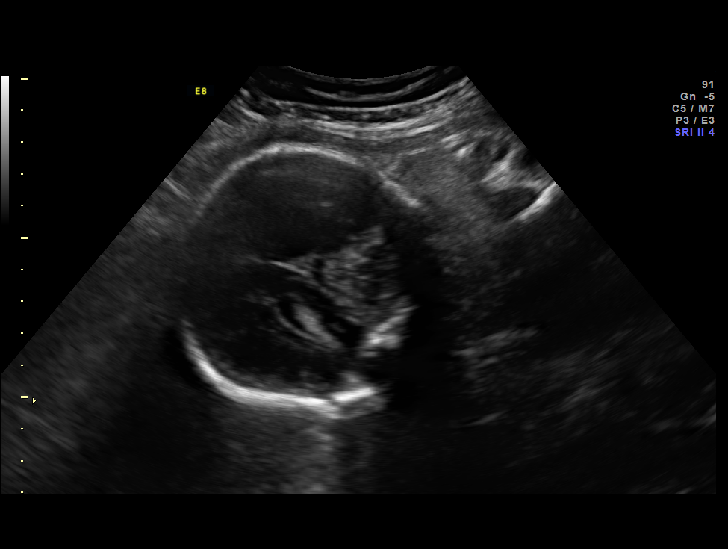
[im 3/19]
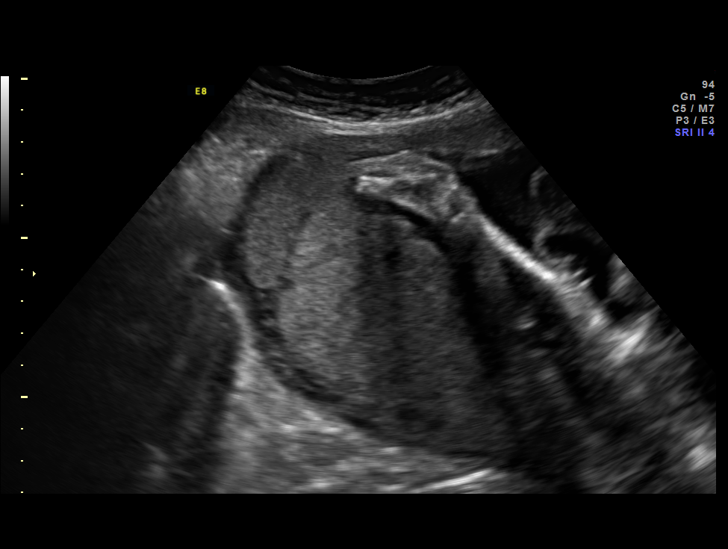
[im 4/19]
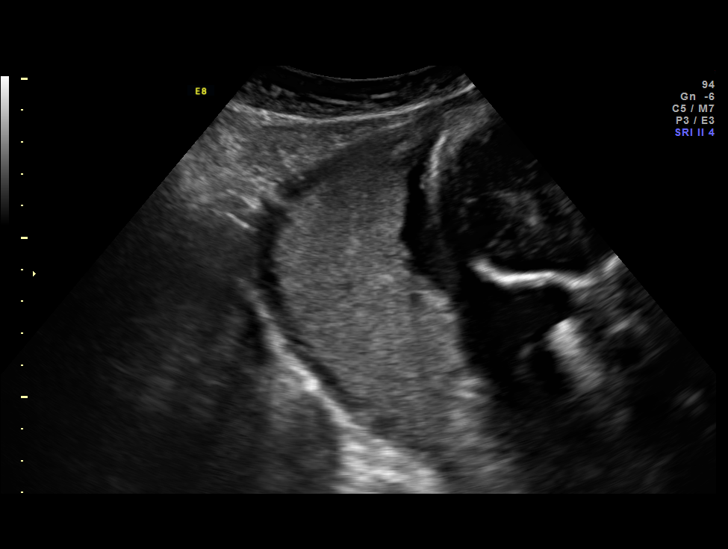
[im 6/19]
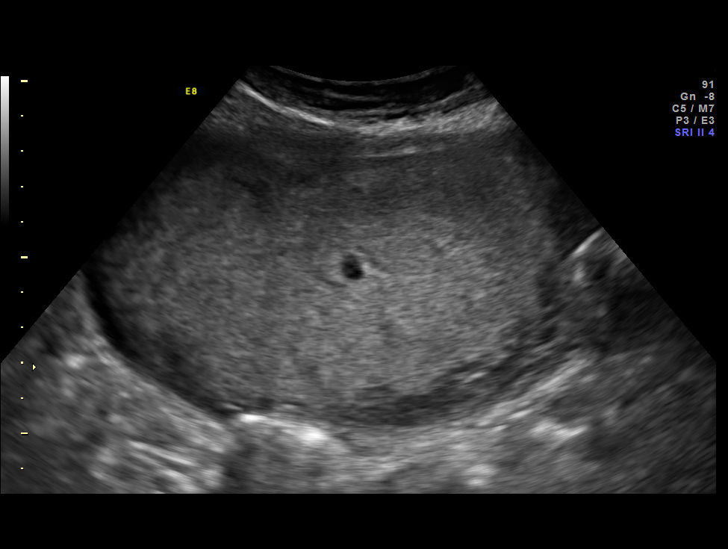
[im 7/19]
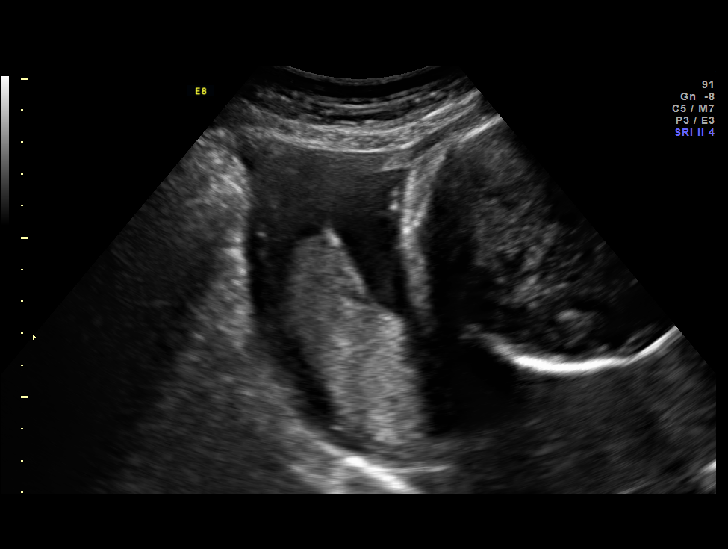
[im 9/19]
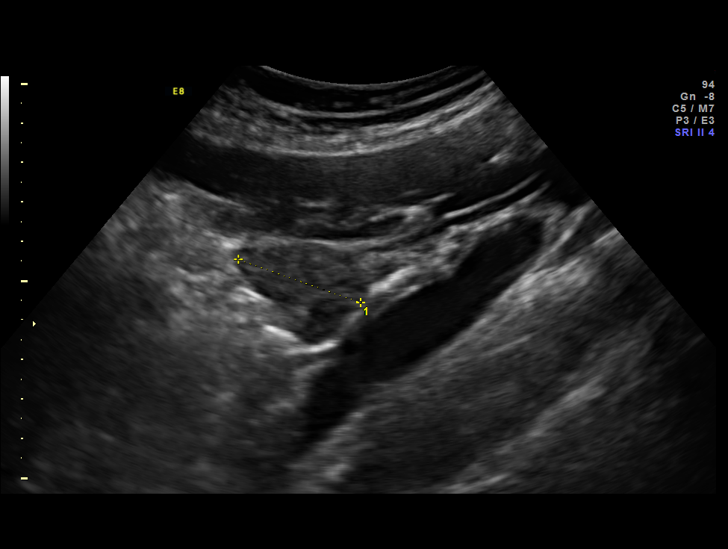
[im 10/19]
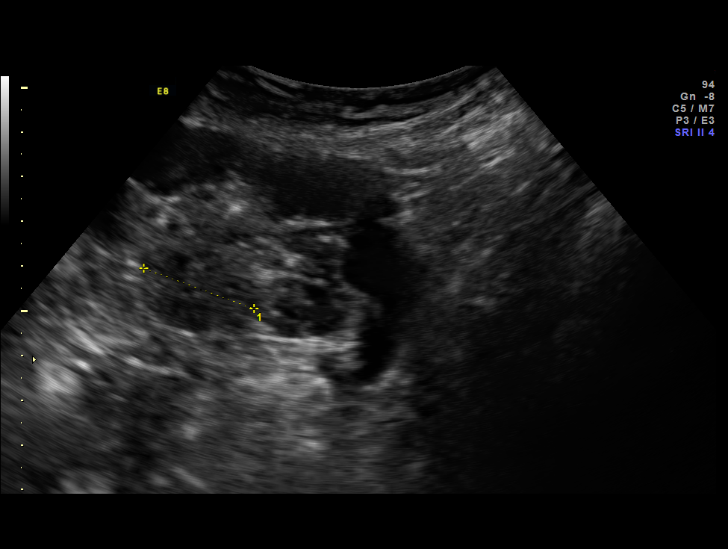
[im 11/19]
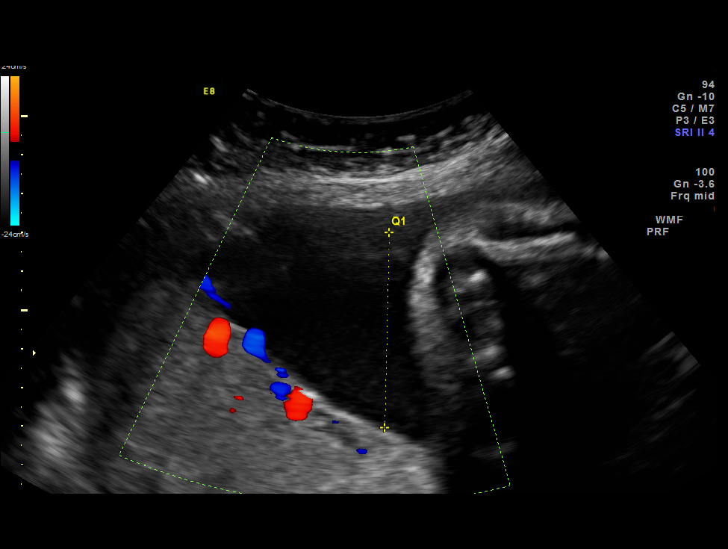
[im 13/19]
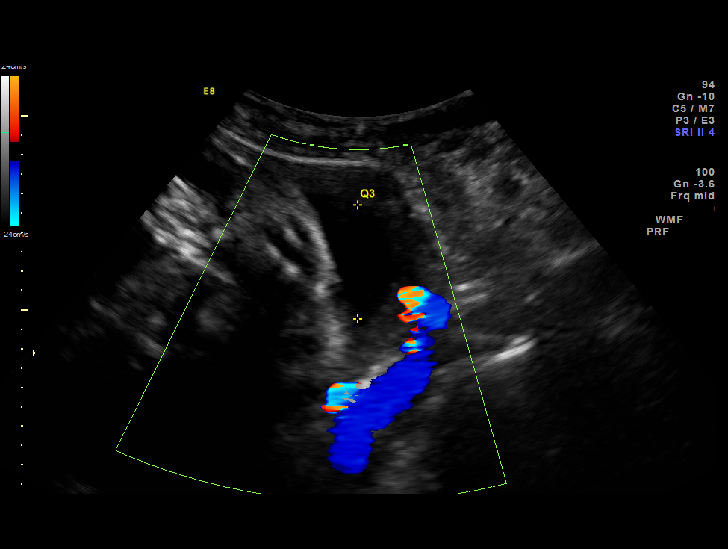
[im 14/19]
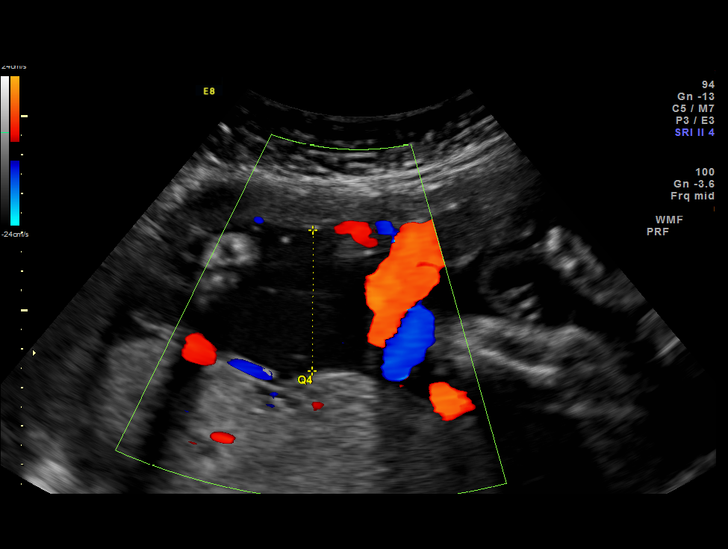
[im 16/19]
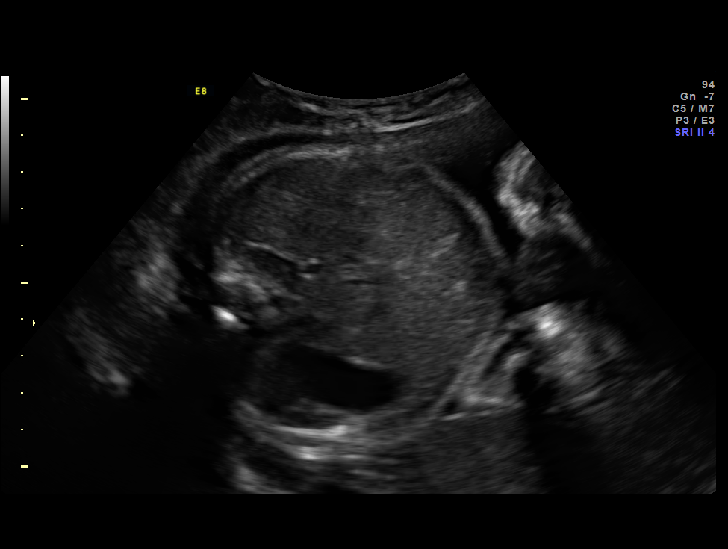
[im 17/19]
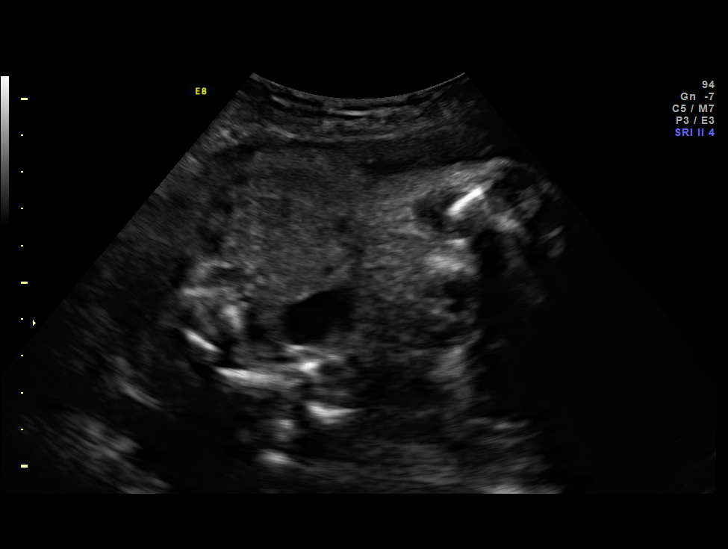
[im 19/19]
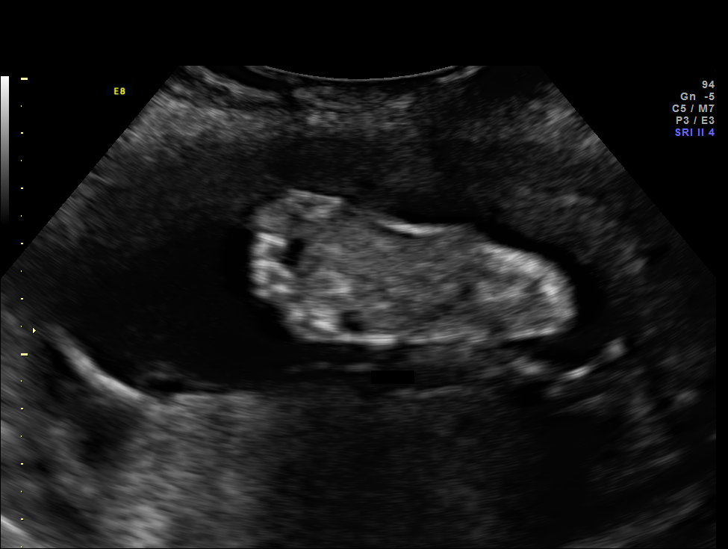

[13 of 19 positions shown; findings below may reference images not displayed]

Road [HOSPITAL]

[address]  MUZZIL KILANAS            [id number]      [id number]     [id number]
Indications

31 weeks gestation of pregnancy
Velamentous insertion of umbilical cord
Decreased amniotic fluid volume
OB History

Gravidity:    6         Term:   2        Prem:   0         SAB:   1
TOP:          2       Ectopic:  0        Living: 2
Fetal Evaluation

Num Of Fetuses:     1
Fetal Heart         153
Rate(bpm):
Cardiac Activity:   Observed
Presentation:       Frank breech
Placenta:           Posterior, above cervical os
P. Cord Insertion:  Velamentous ins (ant/fundal)

Amniotic Fluid
AFI FV:      Subjectively within normal limits
AFI Sum:     12.89    cm      38  %Tile      Larg Pckt:   5.04  cm
RUQ:   5.04    cm   RLQ:    3.64   cm    LUQ:   1.27    cm    LLQ:   2.94    cm
Gestational Age

LMP:           31w 5d        Date:  01/05/15                 EDD:    10/12/15
Best:          31w 5d     Det. By:  LMP  (01/05/15)          EDD:    10/12/15
Cervix Uterus Adnexa
Cervix
Not visualized (advanced GA >45wks)

Left Ovary
Within normal limits.

Right Ovary
Within normal limits.

Adnexa:       No abnormality visualized.
Impression

SIUP at 31+5 weeks
Normal amniotic fluid volume

Recommendations

Follow-up ultrasound for growth in 3 weeks

## 2016-12-02 ENCOUNTER — Other Ambulatory Visit: Payer: Self-pay | Admitting: *Deleted

## 2016-12-02 DIAGNOSIS — Z3042 Encounter for surveillance of injectable contraceptive: Secondary | ICD-10-CM

## 2016-12-02 MED ORDER — MEDROXYPROGESTERONE ACETATE 150 MG/ML IM SUSP: 150.0000 mg | INTRAMUSCULAR | 1 refills | Status: DC

## 2016-12-03 ENCOUNTER — Ambulatory Visit: Payer: Medicaid Other

## 2016-12-15 ENCOUNTER — Ambulatory Visit (INDEPENDENT_AMBULATORY_CARE_PROVIDER_SITE_OTHER): Payer: Medicaid Other

## 2016-12-15 DIAGNOSIS — Z3042 Encounter for surveillance of injectable contraceptive: Secondary | ICD-10-CM

## 2016-12-15 DIAGNOSIS — Z308 Encounter for other contraceptive management: Secondary | ICD-10-CM

## 2016-12-15 MED ORDER — MEDROXYPROGESTERONE ACETATE 150 MG/ML IM SUSP
150.0000 mg | Freq: Once | INTRAMUSCULAR | Status: AC
  Administered 2016-12-15: 150 mg via INTRAMUSCULAR

## 2016-12-15 NOTE — Progress Notes (Signed)
Pt present for Depo. Injection given in right deltoid. Tolerated well. Pt is due for next depo 08/20-09/04.   Administrations This Visit    medroxyPROGESTERone (DEPO-PROVERA) injection 150 mg    Admin Date 12/15/2016 Action Given Dose 150 mg Route Intramuscular Administered By Hamilton CapriBurch, Jeralynn Vaquera J, CMA

## 2017-03-04 ENCOUNTER — Ambulatory Visit (INDEPENDENT_AMBULATORY_CARE_PROVIDER_SITE_OTHER): Payer: Medicaid Other

## 2017-03-04 DIAGNOSIS — Z3042 Encounter for surveillance of injectable contraceptive: Secondary | ICD-10-CM

## 2017-03-04 DIAGNOSIS — Z308 Encounter for other contraceptive management: Secondary | ICD-10-CM

## 2017-03-04 MED ORDER — MEDROXYPROGESTERONE ACETATE 150 MG/ML IM SUSP
150.0000 mg | Freq: Once | INTRAMUSCULAR | Status: AC
  Administered 2017-03-04: 150 mg via INTRAMUSCULAR

## 2017-03-04 NOTE — Progress Notes (Addendum)
Nurse visit for pt supply Depo given R Del w/o difficulty. Next Depo due 11/13 pt agrees. Pt up to date with Annual.

## 2017-04-01 ENCOUNTER — Ambulatory Visit (INDEPENDENT_AMBULATORY_CARE_PROVIDER_SITE_OTHER): Payer: Medicaid Other | Admitting: Certified Nurse Midwife

## 2017-04-01 ENCOUNTER — Other Ambulatory Visit (HOSPITAL_COMMUNITY)
Admission: RE | Admit: 2017-04-01 | Discharge: 2017-04-01 | Disposition: A | Discharge status: Home / Self Care | Payer: Medicaid Other | Source: Ambulatory Visit | Attending: Certified Nurse Midwife | Admitting: Certified Nurse Midwife

## 2017-04-01 ENCOUNTER — Encounter: Payer: Self-pay | Admitting: Certified Nurse Midwife

## 2017-04-01 VITALS — BP 122/77 | HR 92 | Temp 97.3°F | Ht 66.0 in | Wt 164.4 lb

## 2017-04-01 DIAGNOSIS — Z3042 Encounter for surveillance of injectable contraceptive: Secondary | ICD-10-CM

## 2017-04-01 DIAGNOSIS — N760 Acute vaginitis: Secondary | ICD-10-CM

## 2017-04-01 DIAGNOSIS — B9689 Other specified bacterial agents as the cause of diseases classified elsewhere: Secondary | ICD-10-CM

## 2017-04-01 DIAGNOSIS — Z01419 Encounter for gynecological examination (general) (routine) without abnormal findings: Secondary | ICD-10-CM

## 2017-04-01 DIAGNOSIS — B3731 Acute candidiasis of vulva and vagina: Secondary | ICD-10-CM

## 2017-04-01 DIAGNOSIS — Z309 Encounter for contraceptive management, unspecified: Secondary | ICD-10-CM

## 2017-04-01 DIAGNOSIS — Z113 Encounter for screening for infections with a predominantly sexual mode of transmission: Secondary | ICD-10-CM

## 2017-04-01 DIAGNOSIS — Z3009 Encounter for other general counseling and advice on contraception: Secondary | ICD-10-CM

## 2017-04-01 DIAGNOSIS — B373 Candidiasis of vulva and vagina: Secondary | ICD-10-CM

## 2017-04-01 DIAGNOSIS — L910 Hypertrophic scar: Secondary | ICD-10-CM

## 2017-04-01 MED ORDER — FLUCONAZOLE 200 MG PO TABS: 200.0000 mg | ORAL_TABLET | Freq: Once | ORAL | 0 refills | Status: AC

## 2017-04-01 MED ORDER — METRONIDAZOLE 0.75 % VA GEL: 1.0000 | VAGINAL | 4 refills | Status: DC

## 2017-04-01 NOTE — Progress Notes (Signed)
Presents for AEX, wants PAP and STD testing

## 2017-04-01 NOTE — Progress Notes (Signed)
Subjective:        Amy Davies is a 31 y.o. female here for a routine exam.  Current complaints: none. Desires STD screening. States that her keloids are worsening, epically where she had her C-section last year.  Does not currently have insurance.  Does desire dermatology referral once she gets insurance.  Does desire STD screening examination.  Is currently sexually active.  Desires to continue on her Depo injections.    Personal health questionnaire:  Is patient [name] Jewish, have a family history of breast and/or ovarian cancer: no Is there a family history of uterine cancer diagnosed at age < 47, gastrointestinal cancer, urinary tract cancer, family member who is a Personnel officer syndrome-associated carrier: no Is the patient overweight and hypertensive, family history of diabetes, personal history of gestational diabetes, preeclampsia or PCOS: no Is patient over 71, have PCOS,  family history of premature CHD under age 1, diabetes, smoke, have hypertension or peripheral artery disease:  no At any time, has a partner hit, kicked or otherwise hurt or frightened you?: no Over the past 2 weeks, have you felt down, depressed or hopeless?: no Over the past 2 weeks, have you felt little interest or pleasure in doing things?:no   Gynecologic History No LMP recorded. Patient has had an injection. Contraception: Depo-Provera injections Last Pap: 03/25/16. Results were: normal Last mammogram: n/a.   Obstetric History OB History  Gravida Para Term Preterm AB Living  SAB TAB Ectopic Multiple Live Births  1 2 0 0 3    # Outcome Date GA Lbr Len/2nd Weight Sex Delivery Anes PTL Lv  6 Preterm 09/06/15 [redacted]w[redacted]d  4 lb 12.5 oz (2.17 kg) F CS-LTranv Spinal  LIV  5 Term 07/17/10 [redacted]w[redacted]d  8 lb (3.629 kg) M Vag-Spont EPI N LIV  4 SAB 2009 [redacted]w[redacted]d    SAB     3 Term 04/12/03 [redacted]w[redacted]d  7 lb 9 oz (3.43 kg) M Vag-Spont EPI N LIV  2 TAB           1 TAB               Past Medical History:   Diagnosis Date  . Irritable bowel syndrome     Past Surgical History:  Procedure Laterality Date  . CESAREAN SECTION N/A 09/06/2015   Procedure: CESAREAN SECTION;  Surgeon: Brock Bad, MD;  Location: [facility];  Service: Obstetrics;  Laterality: N/A;  . INDUCED ABORTION    . NO PAST SURGERIES       Current Outpatient Prescriptions:  .  medroxyPROGESTERone (DEPO-PROVERA) 150 MG/ML injection, Inject 1 mL (150 mg total) into the muscle every 3 (three) months., Disp: 150 mL, Rfl: 1 .  metroNIDAZOLE (METROGEL) 0.75 % vaginal gel, Place 1 Applicatorful vaginally 2 (two) times a week. For 4-6 months., Disp: 45 g, Rfl: 4 No Known Allergies  Social History  Substance Use Topics  . Smoking status: Former Smoker    Types: Cigarettes  . Smokeless tobacco: Never Used     Comment: July 2016  . Alcohol use 0.0 oz/week     Comment: socially; not since +preg    Family History  Problem Relation Age of Onset  . Asthma Neg Hx   . Diabetes Neg Hx   . Heart disease Neg Hx   . Hypertension Neg Hx   . Kidney disease Neg Hx   . Stroke Neg Hx   . Hearing loss Neg Hx  Review of Systems  Constitutional: negative for fatigue and weight loss Respiratory: negative for cough and wheezing Cardiovascular: negative for chest pain, fatigue and palpitations Gastrointestinal: negative for abdominal pain and change in bowel habits Musculoskeletal:negative for myalgias Neurological: negative for gait problems and tremors Behavioral/Psych: negative for abusive relationship, depression Endocrine: negative for temperature intolerance    Genitourinary:negative for abnormal menstrual periods, genital lesions, hot flashes, sexual problems and vaginal discharge Integument/breast: negative for breast lump, breast tenderness, nipple discharge and skin lesion(s)    Objective:       BP 122/77   Pulse 92   Temp (!) 97.3 F (36.3 C)   Ht  (1.676 m)   Wt 164 lb 6.4 oz (74.6 kg)   LMP  (LMP  Unknown)   Breastfeeding? No   BMI 26.53 kg/m  General:   alert  Skin:   no rash or abnormalities  Lungs:   clear to auscultation bilaterally  Heart:   regular rate and rhythm, S1, S2 normal, no murmur, click, rub or gallop  Breasts:   normal without suspicious masses, skin or nipple changes or axillary nodes  Abdomen:  normal findings: no organomegaly, soft, non-tender and no hernia  Pelvis:  External genitalia: normal general appearance Urinary system: urethral meatus normal and bladder without fullness, nontender Vaginal: normal without tenderness, induration or masses Cervix: normal appearance Adnexa: normal bimanual exam Uterus: anteverted and non-tender, normal size   Lab Review Urine pregnancy test Labs reviewed yes Radiologic studies reviewed no  50% of 30 min visit spent on counseling and coordination of care.    Assessment & Plan    Healthy female exam.  1. Well woman exam with routine gynecological exam     - Cytology - PAP  2. Encounter for surveillance of injectable contraceptive      3. Screening examination for STD (sexually transmitted disease)      - HIV antibody (with reflex) - RPR - Cervicovaginal ancillary only  4. Keloid     5. BV (bacterial vaginosis)   Chronic BV: has tried Flagyl in the past.  RX for boric acid given.  - metroNIDAZOLE (METROGEL) 0.75 % vaginal gel; Place 1 Applicatorful vaginally 2 (two) times a week. For 4-6 months.  Dispense: 45 g; Refill: 4  6. Yeast infection involving the vagina and surrounding area    - fluconazole (DIFLUCAN) 200 MG tablet; Take 1 tablet (200 mg total) by mouth once. Repeat dose in 48-72 hours.  Dispense: 3 tablet; Refill: 0     Education reviewed: calcium supplements, depression evaluation, low fat, low cholesterol diet, safe sex/STD prevention, self breast exams, skin cancer screening and weight bearing exercise. Contraception: Depo-Provera injections. Follow up in: 1 year.   Meds ordered this  encounter  Medications  . fluconazole (DIFLUCAN) 200 MG tablet    Sig: Take 1 tablet (200 mg total) by mouth once. Repeat dose in 48-72 hours.    Dispense:  3 tablet    Refill:  0  . metroNIDAZOLE (METROGEL) 0.75 % vaginal gel    Sig: Place 1 Applicatorful vaginally 2 (two) times a week. For 4-6 months.    Dispense:  45 g    Refill:  4   Orders Placed This Encounter  Procedures  . HIV antibody (with reflex)  . RPR    Possible management options include: Solosec.

## 2017-04-02 ENCOUNTER — Encounter: Payer: Self-pay | Admitting: Certified Nurse Midwife

## 2017-04-02 ENCOUNTER — Other Ambulatory Visit: Payer: Self-pay | Admitting: Certified Nurse Midwife

## 2017-04-02 LAB — CYTOLOGY - PAP
Diagnosis: NEGATIVE
HPV: NOT DETECTED

## 2017-04-02 LAB — CERVICOVAGINAL ANCILLARY ONLY
Chlamydia: NEGATIVE
NEISSERIA GONORRHEA: NEGATIVE
TRICH (WINDOWPATH): NEGATIVE

## 2017-04-15 ENCOUNTER — Ambulatory Visit (INDEPENDENT_AMBULATORY_CARE_PROVIDER_SITE_OTHER): Payer: Self-pay | Admitting: Physician Assistant

## 2017-05-27 ENCOUNTER — Other Ambulatory Visit: Payer: Self-pay | Admitting: Obstetrics

## 2017-05-29 ENCOUNTER — Ambulatory Visit (INDEPENDENT_AMBULATORY_CARE_PROVIDER_SITE_OTHER): Payer: Medicaid Other

## 2017-05-29 DIAGNOSIS — Z3042 Encounter for surveillance of injectable contraceptive: Secondary | ICD-10-CM | POA: Diagnosis not present

## 2017-05-29 MED ORDER — MEDROXYPROGESTERONE ACETATE 150 MG/ML IM SUSP
150.0000 mg | Freq: Once | INTRAMUSCULAR | Status: AC
  Administered 2017-05-29: 150 mg via INTRAMUSCULAR

## 2017-05-29 NOTE — Progress Notes (Signed)
Nurse visit for pt supply Depo given R del w/o difficulty. Next Depo due Feb 1-Feb 15. Pt was on time for inj. Pap up to date

## 2017-08-20 ENCOUNTER — Ambulatory Visit (INDEPENDENT_AMBULATORY_CARE_PROVIDER_SITE_OTHER): Payer: Medicaid Other

## 2017-08-20 ENCOUNTER — Telehealth: Payer: Self-pay | Admitting: *Deleted

## 2017-08-20 VITALS — Wt 157.2 lb

## 2017-08-20 DIAGNOSIS — Z3042 Encounter for surveillance of injectable contraceptive: Secondary | ICD-10-CM

## 2017-08-20 DIAGNOSIS — Z309 Encounter for contraceptive management, unspecified: Secondary | ICD-10-CM

## 2017-08-20 MED ORDER — MEDROXYPROGESTERONE ACETATE 150 MG/ML IM SUSP
150.0000 mg | INTRAMUSCULAR | Status: AC
  Administered 2017-08-20 – 2024-06-08 (×12): 150 mg via INTRAMUSCULAR

## 2017-08-20 NOTE — Telephone Encounter (Signed)
error 

## 2017-08-20 NOTE — Progress Notes (Signed)
Pt is in the office for depo injection, administered and pt tolerated well .Marland Kitchen. Administrations This Visit    medroxyPROGESTERone (DEPO-PROVERA) injection 150 mg    Admin Date 08/20/2017 Action Given Dose 150 mg Route Intramuscular Administered By Katrina StackStalling, Brittany D, RN

## 2017-08-24 NOTE — Progress Notes (Signed)
I have reviewed the chart and agree with nursing staff's documentation of this patient's encounter.  Jaynie CollinsUgonna Satori Krabill, MD 08/24/2017 8:13 AM

## 2017-11-03 ENCOUNTER — Other Ambulatory Visit: Payer: Self-pay | Admitting: Obstetrics

## 2017-11-04 ENCOUNTER — Ambulatory Visit: Payer: Medicaid Other

## 2017-11-10 ENCOUNTER — Ambulatory Visit: Payer: Medicaid Other | Admitting: Certified Nurse Midwife

## 2017-11-10 ENCOUNTER — Ambulatory Visit: Payer: Medicaid Other

## 2017-11-11 ENCOUNTER — Ambulatory Visit (INDEPENDENT_AMBULATORY_CARE_PROVIDER_SITE_OTHER): Payer: BLUE CROSS/BLUE SHIELD | Admitting: Certified Nurse Midwife

## 2017-11-11 VITALS — BP 121/76 | HR 101 | Wt 150.0 lb

## 2017-11-11 DIAGNOSIS — Z3042 Encounter for surveillance of injectable contraceptive: Secondary | ICD-10-CM | POA: Diagnosis not present

## 2017-11-11 DIAGNOSIS — F329 Major depressive disorder, single episode, unspecified: Secondary | ICD-10-CM

## 2017-11-11 MED ORDER — CITALOPRAM HYDROBROMIDE 20 MG PO TABS: 20.0000 mg | ORAL_TABLET | Freq: Every day | ORAL | 2 refills | Status: DC

## 2017-11-11 NOTE — Progress Notes (Signed)
Pt complaints of losing weight and thinks it may be birth control related. Pt was given PHQ-9 and pt score of 9.

## 2017-11-12 ENCOUNTER — Institutional Professional Consult (permissible substitution): Payer: Medicaid Other

## 2017-11-12 ENCOUNTER — Encounter: Payer: Self-pay | Admitting: Certified Nurse Midwife

## 2017-11-12 NOTE — Progress Notes (Signed)
Subjective:    Amy Davies is a 32 y.o. female who presents for contraception counseling. The patient has no complaints today. The patient is sexually active. Pertinent past medical history: none.  She reports decreased appetite and 14 lb weight loss in a few months.  Is not having periods with Depo injections.  Reports symptoms consistent with depression.  Is separated from FOB currently.  Is not breast feeding.  Is working.    Reviewed all forms of birth control options available including abstinence; fertility period awareness methods; over the counter/barrier methods; hormonal contraceptive medication including pill, patch, ring, injection,contraceptive implant; hormonal and nonhormonal IUDs; permanent sterilization options including vasectomy and the various tubal sterilization modalities. Risks and benefits reviewed.  Questions were answered.  Information was given to patient to review.    The information documented in the HPI was reviewed and verified.  Menstrual History: OB History    Gravida  6   Para  3   Term  2   Preterm  1   AB  3   Living  3     SAB  1   TAB  2   Ectopic  0   Multiple  0   Live Births  3            No LMP recorded. Patient has had an injection.   Patient Active Problem List   Diagnosis Date Noted  . PROM (premature rupture of membranes) 09/04/2015   Past Medical History:  Diagnosis Date  . Irritable bowel syndrome     Past Surgical History:  Procedure Laterality Date  . CESAREAN SECTION N/A 09/06/2015   Procedure: CESAREAN SECTION;  Surgeon: Brock Bad, MD;  Location: [facility];  Service: Obstetrics;  Laterality: N/A;  . INDUCED ABORTION    . NO PAST SURGERIES       Current Outpatient Medications:  .  medroxyPROGESTERone (DEPO-PROVERA) 150 MG/ML injection, INJECT 1 ML (150 MG TOTAL) INTO THE MUSCLE EVERY 3 (THREE) MONTHS., Disp: 1 mL, Rfl: 1 .  citalopram (CELEXA) 20 MG tablet, Take 1 tablet (20 mg total) by mouth daily.,  Disp: 30 tablet, Rfl: 2  Current Facility-Administered Medications:  .  medroxyPROGESTERone (DEPO-PROVERA) injection 150 mg, 150 mg, Intramuscular, Q90 days, Annakate Soulier A, CNM, 150 mg at 08/20/17 1122 No Known Allergies  Social History   Tobacco Use  . Smoking status: Former Smoker    Types: Cigarettes  . Smokeless tobacco: Never Used  . Tobacco comment: July 2016  Substance Use Topics  . Alcohol use: Yes    Alcohol/week: 0.0 oz    Comment: socially; not since +preg    Family History  Problem Relation Age of Onset  . Asthma Neg Hx   . Diabetes Neg Hx   . Heart disease Neg Hx   . Hypertension Neg Hx   . Kidney disease Neg Hx   . Stroke Neg Hx   . Hearing loss Neg Hx        Review of Systems Constitutional: + for weight loss Genitourinary:negative for abnormal menstrual periods and vaginal discharge    Objective:   BP 121/76   Pulse (!) 101   Wt 150 lb (68 kg)   BMI 24.21 kg/m    General:   alert  Skin:   no rash or abnormalities  Lungs:   clear to auscultation bilaterally  Heart:   regular rate and rhythm, S1, S2 normal, no murmur, click, rub or gallop  Breasts:  deferred  Abdomen:  normal findings: no organomegaly, soft, non-tender and no hernia  Pelvis: Deferred    Depression screening: +  Lab Review Urine pregnancy test Labs reviewed yes Radiologic studies reviewed no  50% of 25 min visit spent on counseling and coordination of care.    Assessment & Plan    32 y.o., continuing Depo-Provera injections, no contraindications.   1. Reactive depression      Counseling scheduled.   - citalopram (CELEXA) 20 MG tablet; Take 1 tablet (20 mg total) by mouth daily.  Dispense: 30 tablet; Refill: 2  2. Encounter for surveillance of injectable contraceptive         All questions answered. Meds ordered this encounter  Medications  . citalopram (CELEXA) 20 MG tablet    Sig: Take 1 tablet (20 mg total) by mouth daily.    Dispense:  30 tablet     Refill:  2     Follow up as needed.

## 2017-11-12 NOTE — BH Specialist Note (Deleted)
Integrated Behavioral Health Initial Visit  MRN: [id number] Name: SYBRINA LANING  Number of Integrated Behavioral Health Clinician visits:: 1/6 Session Start time: ***  Session End time: *** Total time: {IBH Total Time:21014050}  Type of Service: Integrated Behavioral Health- Individual/Family Interpretor:No. Interpretor Name and Language: n/a   Warm Hand Off Completed.       SUBJECTIVE: TERESA NICODEMUS is a 32 y.o. female accompanied by {CHL AMB ACCOMPANIED WU:9811914782} Patient was referred by *** for ***. Patient reports the following symptoms/concerns: *** Duration of problem: ***; Severity of problem: {Mild/Moderate/Severe:20260}  OBJECTIVE: Mood: {BHH MOOD:22306} and Affect: {BHH AFFECT:22307} Risk of harm to self or others: {CHL AMB BH Suicide Current Mental Status:21022748}  LIFE CONTEXT: Family and Social: *** School/Work: *** Self-Care: *** Life Changes: ***  GOALS ADDRESSED: Patient will: 1. Reduce symptoms of: {IBH Symptoms:21014056} 2. Increase knowledge and/or ability of: {IBH Patient Tools:21014057}  3. Demonstrate ability to: {IBH Goals:21014053}  INTERVENTIONS: Interventions utilized: {IBH Interventions:21014054}  Standardized Assessments completed: {IBH Screening Tools:21014051}  ASSESSMENT: Patient currently experiencing ***.   Patient may benefit from ***.  PLAN: 1. Follow up with behavioral health clinician on : *** 2. Behavioral recommendations: *** 3. Referral(s): {IBH Referrals:21014055} 4. "From scale of 1-10, how likely are you to follow plan?": ***  Rae Lips, LCSW   Depression screen [facility] 2/9 11/11/2017 04/01/2017 08/31/2015 08/15/2015 08/09/2015  Decreased Interest 1 0 0 0 0  Down, Depressed, Hopeless 1 0 0 0 0  PHQ - 2 Score 2 0 0 0 0  Altered sleeping 0 0 - - -  Tired, decreased energy 2 1 - - -  Change in appetite 3 0 - - -  Feeling bad or failure about yourself  1 0 - - -  Trouble concentrating 0 0 - - -  Moving slowly  or fidgety/restless 1 0 - - -  Suicidal thoughts 0 0 - - -  PHQ-9 Score 9 1 - - -  Difficult doing work/chores Somewhat difficult Not difficult at all - - -   No flowsheet data found.

## 2017-11-18 ENCOUNTER — Ambulatory Visit (INDEPENDENT_AMBULATORY_CARE_PROVIDER_SITE_OTHER): Payer: BLUE CROSS/BLUE SHIELD | Admitting: *Deleted

## 2017-11-18 VITALS — BP 116/80 | HR 86 | Wt 149.0 lb

## 2017-11-18 DIAGNOSIS — Z3042 Encounter for surveillance of injectable contraceptive: Secondary | ICD-10-CM | POA: Diagnosis not present

## 2017-11-18 NOTE — Progress Notes (Signed)
Pt is in office for depo injection. Pt is on time for injection.  Pt tolerated injection well.   Pt advised to RTO 7/24-8/7 for next depo.  BP 116/80   Pulse 86   Wt 149 lb (67.6 kg)   BMI 24.05 kg/m   Administrations This Visit    medroxyPROGESTERone (DEPO-PROVERA) injection 150 mg    Admin Date 11/18/2017 Action Given Dose 150 mg Route Intramuscular Administered By Lanney Gins, CMA

## 2017-11-19 NOTE — Progress Notes (Signed)
I have reviewed the chart and agree with nursing staff's documentation of this patient's encounter.  Roe Coombs, CNM 11/19/2017 3:36 PM

## 2017-11-30 ENCOUNTER — Telehealth: Payer: Self-pay

## 2017-11-30 ENCOUNTER — Other Ambulatory Visit: Payer: Self-pay | Admitting: Certified Nurse Midwife

## 2017-11-30 DIAGNOSIS — F329 Major depressive disorder, single episode, unspecified: Secondary | ICD-10-CM

## 2017-11-30 MED ORDER — SERTRALINE HCL 100 MG PO TABS: 100.0000 mg | ORAL_TABLET | Freq: Every day | ORAL | 2 refills | Status: DC

## 2017-11-30 NOTE — Telephone Encounter (Signed)
Return call to patient regarding Rx Celexa   pt states she wants something different celexa is causing headaches constantly and she has no desire to eat  Pt also states she read online the side effects  Pt has since discontinued taking Celexa please advise.

## 2017-11-30 NOTE — Telephone Encounter (Signed)
Please let her know that I sent in Zoloft for her to try.  Is she scheduled with counseling yet? Thank you Boykin Reaper

## 2017-11-30 NOTE — Telephone Encounter (Signed)
Pt missed counseling appt  I made scheduling aware to R/S.

## 2017-12-01 NOTE — BH Specialist Note (Signed)
Integrated Behavioral Health Initial Visit  MRN: [id number] Name: Amy Davies  Number of Integrated Behavioral Health Clinician visits:: 1/6 Session Start time: 10:35 Session End time: 11:13 Total time: 35 minutes  Type of Service: Integrated Behavioral Health- Individual/Family Interpretor:No. Interpretor Name and Language: n/a   Warm Hand Off Completed.       SUBJECTIVE: Amy Davies is a 32 y.o. female accompanied by n/a Patient was referred by Orvilla Cornwall, CNM for depression. Patient reports the following symptoms/concerns: Pt states her primary concern today is a lack of appetite and weight loss during overwhelming life stress; pt will pick up new BH medication today to begin taking, and is open to learning self-coping strategies for daily stress, anxiety, and depression.  Duration of problem: Over two months; Severity of problem: mild  OBJECTIVE: Mood: Normal and Affect: Appropriate Risk of harm to self or others: No plan to harm self or others  LIFE CONTEXT: Family and Social: Pt bought a home with her mother; Children and children's father live in the home with pt. Pt wants ex to move out.  School/Work: Working full-time as Psychologist, prison and probation services: Recognizing greater need for self-care Life Changes: Recent separation from children's father after his infidelities; bought current house with her mother one year ago  GOALS ADDRESSED: Patient will: 1. Reduce symptoms of: anxiety, depression and stress 2. Increase knowledge and/or ability of: self-management skills  3. Demonstrate ability to: Increase healthy adjustment to current life circumstances and Increase adequate support systems for patient/family  INTERVENTIONS: Interventions utilized: Mindfulness or Management consultant, Psychoeducation and/or Health Education and Link to [facility]  Standardized Assessments completed: GAD-7 and PHQ 9  ASSESSMENT: Patient currently experiencing Adjustment  disorder with mixed depressed and anxious mood.   Patient may benefit from psychoeducation and brief therapeutic interventions regarding coping with symptoms of depression and anxiety .  PLAN: 1. Follow up with behavioral health clinician on : One week 2. Behavioral recommendations:  -CALM relaxation breathing exercise daily, with morning coffee -Consider [facility] for access to legal help  -Consider [facility] for family counseling services, as needed -Read educational materials regarding coping with symptoms of depression and anxiety  3. Referral(s): Integrated Art gallery manager (In Clinic) and [facility] (LME/Outside Clinic) 4. "From scale of 1-10, how likely are you to follow plan?": 9  Rae Lips, LCSW  Depression screen [facility] 2/9 12/04/2017 11/11/2017 04/01/2017 08/31/2015 08/15/2015  Decreased Interest 1 1 0 0 0  Down, Depressed, Hopeless 1 1 0 0 0  PHQ - 2 Score 2 2 0 0 0  Altered sleeping 0 0 0 - -  Tired, decreased energy - -  Change in appetite 3 3 0 - -  Feeling bad or failure about yourself  1 1 0 - -  Trouble concentrating 0 0 0 - -  Moving slowly or fidgety/restless 0 1 0 - -  Suicidal thoughts 0 0 0 - -  PHQ-9 Score - -  Difficult doing work/chores - Somewhat difficult Not difficult at all - -   GAD 7 : Generalized Anxiety Score 12/04/2017  Nervous, Anxious, on Edge 1  Control/stop worrying 1  Worry too much - different things 2  Trouble relaxing 1  Restless 0  Easily annoyed or irritable 2  Afraid - awful might happen 1  Total GAD 7 Score 8

## 2017-12-04 ENCOUNTER — Ambulatory Visit (INDEPENDENT_AMBULATORY_CARE_PROVIDER_SITE_OTHER): Payer: Self-pay | Admitting: Clinical

## 2017-12-04 DIAGNOSIS — F4323 Adjustment disorder with mixed anxiety and depressed mood: Secondary | ICD-10-CM

## 2017-12-08 ENCOUNTER — Telehealth: Payer: Self-pay | Admitting: Clinical

## 2017-12-08 NOTE — Telephone Encounter (Signed)
Integrated Behavioral Health Medication Management Phone Note  MRN: [id number] NAME: Amy Davies  Time Call Initiated: 2:11 Time Call Completed: 2:13 Total Call Time: 2 minutes  Current Medications:  Outpatient Medications Prior to Visit  Medication Sig Dispense Refill  . medroxyPROGESTERone (DEPO-PROVERA) 150 MG/ML injection INJECT 1 ML (150 MG TOTAL) INTO THE MUSCLE EVERY 3 (THREE) MONTHS. 1 mL 1  . sertraline (ZOLOFT) 100 MG tablet Take 1 tablet (100 mg total) by mouth daily. 30 tablet 2   Facility-Administered Medications Prior to Visit  Medication Dose Route Frequency Provider Last Rate Last Dose  . medroxyPROGESTERone (DEPO-PROVERA) injection 150 mg  150 mg Intramuscular Q90 days Orvilla Cornwall A, CNM   150 mg at 11/18/17 4098    Patient has been unable to get all medications filled as prescribed:  Pt was out of town, and agrees to pick up [facility] medication this afternoon, and requests a follow-up call from [facility] in one week.   Valetta Close Bharat Antillon, LCSW

## 2017-12-15 ENCOUNTER — Telehealth: Payer: Self-pay | Admitting: Clinical

## 2017-12-15 NOTE — Telephone Encounter (Signed)
Integrated Behavioral Health Medication Management Phone Note  MRN: [id number] NAME: Amy Davies  Time Call Initiated: 3:21 Time Call Completed: 3:27 Total Call Time: 6 minutes  Current Medications:  Outpatient Medications Prior to Visit  Medication Sig Dispense Refill  . medroxyPROGESTERone (DEPO-PROVERA) 150 MG/ML injection INJECT 1 ML (150 MG TOTAL) INTO THE MUSCLE EVERY 3 (THREE) MONTHS. 1 mL 1  . sertraline (ZOLOFT) 100 MG tablet Take 1 tablet (100 mg total) by mouth daily. 30 tablet 2   Facility-Administered Medications Prior to Visit  Medication Dose Route Frequency Provider Last Rate Last Dose  . medroxyPROGESTERone (DEPO-PROVERA) injection 150 mg  150 mg Intramuscular Q90 days Orvilla CornwallDenney, Rachelle A, CNM   150 mg at 11/18/17 [id number]    Patient has been able to get all medications filled as prescribed: : yes  Patient is currently taking all medications as prescribed: Yes  Patient reports experiencing side effects: Yes- Headache, dry mouth, diarrhea  Patient describes feeling this way on medications: sleepy, so started taking at night, as recommended by pharmacist  Additional patient concerns: no  Patient advised to schedule appointment with provider for evaluation of medication side effects or additional concerns: No, will consult with provider first   Rae LipsJamie C Anhar Mcdermott, LCSW

## 2017-12-16 ENCOUNTER — Other Ambulatory Visit: Payer: Self-pay | Admitting: Certified Nurse Midwife

## 2017-12-17 ENCOUNTER — Telehealth: Payer: Self-pay | Admitting: *Deleted

## 2017-12-17 NOTE — Telephone Encounter (Signed)
-----   Message from Roe Coombsachelle A Denney, CNM sent at 12/16/2017  4:51 PM EDT ----- Please have her cut the RX in half.  We will try the 50 mg dosage and see how she does.  Thank you.  Rachelle  ----- Message ----- From: Raelyn Moraawson, Rolitta, CNM Sent: 12/15/2017   4:51 PM To: Roe Coombsachelle A Denney, CNM    ----- Message ----- From: Peter CongoMcMannes, Jamie C, LCSW Sent: 12/15/2017   4:39 PM To: Raelyn Moraolitta Dawson, CNM  Pt is experiencing headache, dry mouth and diarrhea with Zoloft, and wants to know if she should continue taking as prescribed, or take a lower dosage? Please advise.

## 2017-12-17 NOTE — Telephone Encounter (Signed)
Pt made aware to cut dosage by half, take 50mg  daily. Pt advised to contact office if she feels change in dose does not work. Pt states understanding.

## 2018-01-22 ENCOUNTER — Other Ambulatory Visit: Payer: Self-pay

## 2018-01-22 DIAGNOSIS — N898 Other specified noninflammatory disorders of vagina: Secondary | ICD-10-CM

## 2018-01-22 MED ORDER — FLUCONAZOLE 150 MG PO TABS: 150.0000 mg | ORAL_TABLET | Freq: Once | ORAL | 0 refills | Status: AC

## 2018-01-22 MED ORDER — METRONIDAZOLE 500 MG PO TABS: 500.0000 mg | ORAL_TABLET | Freq: Two times a day (BID) | ORAL | 0 refills | Status: AC

## 2018-01-22 NOTE — Progress Notes (Unsigned)
TC from patient c/o discharge and odor Rx's sent per protocol  Pt notified.  Pt made aware if no relief to schedule appt for evaluation Pt voiced understanding.

## 2018-02-15 ENCOUNTER — Ambulatory Visit (INDEPENDENT_AMBULATORY_CARE_PROVIDER_SITE_OTHER): Payer: Medicaid Other | Admitting: *Deleted

## 2018-02-15 VITALS — BP 121/79 | HR 80 | Wt 149.0 lb

## 2018-02-15 DIAGNOSIS — Z3042 Encounter for surveillance of injectable contraceptive: Secondary | ICD-10-CM | POA: Diagnosis not present

## 2018-02-15 NOTE — Progress Notes (Signed)
I have reviewed the chart and agree with nursing staff's documentation of this patient's encounter.  Catalina AntiguaPeggy Dixon Luczak, MD 02/15/2018 3:11 PM

## 2018-02-15 NOTE — Progress Notes (Signed)
Pt is in office today for depo injection.  Pt is on time for injection and pt has supplied depo for today's visit.  Pt tolerated injection well.  Pt states no other problems today.  Pt advised to RTO 05/09/18 for next depo. Pt made aware she will need AEX prior to next depo due to Ins coverage and noted for check out.    BP 121/79   Pulse 80   Wt 149 lb (67.6 kg)   BMI 24.05 kg/m   Administrations This Visit    medroxyPROGESTERone (DEPO-PROVERA) injection 150 mg    Admin Date 02/15/2018 Action Given Dose 150 mg Route Intramuscular Administered By Lanney GinsFoster, Suzanne D, CMA

## 2018-03-16 ENCOUNTER — Telehealth: Payer: Self-pay

## 2018-03-16 MED ORDER — FLUCONAZOLE 150 MG PO TABS: 150.0000 mg | ORAL_TABLET | Freq: Once | ORAL | 0 refills | Status: AC

## 2018-03-16 NOTE — Telephone Encounter (Signed)
Returned call, pt stated that she is having vaginal itching and would like diflucan sent. Advised pt that it would be sent.

## 2018-04-02 ENCOUNTER — Other Ambulatory Visit (HOSPITAL_COMMUNITY)
Admission: RE | Admit: 2018-04-02 | Discharge: 2018-04-02 | Disposition: A | Discharge status: Home / Self Care | Payer: Medicaid Other | Source: Ambulatory Visit | Attending: Advanced Practice Midwife | Admitting: Advanced Practice Midwife

## 2018-04-02 ENCOUNTER — Ambulatory Visit (INDEPENDENT_AMBULATORY_CARE_PROVIDER_SITE_OTHER): Payer: Medicaid Other | Admitting: Advanced Practice Midwife

## 2018-04-02 ENCOUNTER — Encounter: Payer: Self-pay | Admitting: Advanced Practice Midwife

## 2018-04-02 VITALS — BP 120/76 | HR 89 | Ht 66.0 in | Wt 148.4 lb

## 2018-04-02 DIAGNOSIS — Z0000 Encounter for general adult medical examination without abnormal findings: Secondary | ICD-10-CM

## 2018-04-02 DIAGNOSIS — N760 Acute vaginitis: Secondary | ICD-10-CM

## 2018-04-02 DIAGNOSIS — Z1151 Encounter for screening for human papillomavirus (HPV): Secondary | ICD-10-CM | POA: Diagnosis not present

## 2018-04-02 DIAGNOSIS — Z01419 Encounter for gynecological examination (general) (routine) without abnormal findings: Secondary | ICD-10-CM | POA: Diagnosis not present

## 2018-04-02 DIAGNOSIS — B9689 Other specified bacterial agents as the cause of diseases classified elsewhere: Secondary | ICD-10-CM

## 2018-04-02 DIAGNOSIS — K649 Unspecified hemorrhoids: Secondary | ICD-10-CM

## 2018-04-02 MED ORDER — TUCKS 50 % EX PADS: 1.0000 | MEDICATED_PAD | CUTANEOUS | 3 refills | Status: DC | PRN

## 2018-04-02 MED ORDER — POLYETHYLENE GLYCOL 3350 17 G PO PACK: 17.0000 g | PACK | Freq: Every day | ORAL | 0 refills | Status: DC

## 2018-04-02 MED ORDER — DOCUSATE SODIUM 100 MG PO CAPS: 100.0000 mg | ORAL_CAPSULE | Freq: Two times a day (BID) | ORAL | 0 refills | Status: DC

## 2018-04-02 MED ORDER — ACIDOPHILUS LACTOBACILLUS PO CAPS: 1.0000 | ORAL_CAPSULE | Freq: Every day | ORAL | 3 refills | Status: DC | PRN

## 2018-04-02 NOTE — Progress Notes (Signed)
GYNECOLOGY ANNUAL PREVENTATIVE CARE ENCOUNTER NOTE  Subjective:   Amy Davies is a 32 y.o. [phone] female here for a routine annual gynecologic exam.  Current complaints: elevated stress level and subsequent weight loss. Patient estimates she has lost 30 lbs from what she considers her "normal for me" weight over the past 12-18 months. Patient is in the process of separating from her partner of 15-16 years, also purchased a home last year. Recently restarted Zoloft and bimonthly counseling.   Denies abnormal vaginal bleeding, discharge, pelvic pain, problems with intercourse or other gynecologic concerns.    Gynecologic History No LMP recorded. Patient has had an injection. Contraception: Depo-Provera injections Last Pap: 2018. Results were: normal with negative HPV Last mammogram: N/A age 12.   Obstetric History OB History  Gravida Para Term Preterm AB Living  6 3 2 1 3 3   SAB TAB Ectopic Multiple Live Births  1 2 0 0 3    # Outcome Date GA Lbr Len/2nd Weight Sex Delivery Anes PTL Lv  6 Preterm 09/06/15 [redacted]w[redacted]d  4 lb 12.5 oz (2.17 kg) F CS-LTranv Spinal  LIV  5 Term 07/17/10 [redacted]w[redacted]d  8 lb (3.629 kg) M Vag-Spont EPI N LIV  4 SAB 2009 [redacted]w[redacted]d    SAB     3 Term 04/12/03 [redacted]w[redacted]d  7 lb 9 oz (3.43 kg) M Vag-Spont EPI N LIV  2 TAB           1 TAB             Past Medical History:  Diagnosis Date  . Irritable bowel syndrome     Past Surgical History:  Procedure Laterality Date  . CESAREAN SECTION N/A 09/06/2015   Procedure: CESAREAN SECTION;  Surgeon: Brock Bad, MD;  Location: [facility];  Service: Obstetrics;  Laterality: N/A;  . INDUCED ABORTION    . NO PAST SURGERIES      Current Outpatient Medications on File Prior to Visit  Medication Sig Dispense Refill  . medroxyPROGESTERone (DEPO-PROVERA) 150 MG/ML injection INJECT 1 ML (150 MG TOTAL) INTO THE MUSCLE EVERY 3 (THREE) MONTHS. 1 mL 1  . sertraline (ZOLOFT) 100 MG tablet Take 1 tablet (100 mg total) by mouth daily. 30 tablet  2   Current Facility-Administered Medications on File Prior to Visit  Medication Dose Route Frequency Provider Last Rate Last Dose  . medroxyPROGESTERone (DEPO-PROVERA) injection 150 mg  150 mg Intramuscular Q90 days Denney, Rachelle A, CNM   150 mg at 02/15/18 1135    No Known Allergies  Social History:  reports that she has quit smoking. Her smoking use included cigarettes. She has never used smokeless tobacco. She reports that she drinks alcohol. She reports that she does not use drugs.  Family History  Problem Relation Age of Onset  . Asthma Neg Hx   . Diabetes Neg Hx   . Heart disease Neg Hx   . Hypertension Neg Hx   . Kidney disease Neg Hx   . Stroke Neg Hx   . Hearing loss Neg Hx     The following portions of the patient's history were reviewed and updated as appropriate: allergies, current medications, past family history, past medical history, past social history, past surgical history and problem list.  Review of Systems Pertinent items noted in HPI and remainder of comprehensive ROS otherwise negative.   Objective:  BP 120/76   Pulse 89   Ht 5\' 6"  (1.676 m)   Wt 148 lb 6.4 oz (  67.3 kg)   BMI 23.95 kg/m  CONSTITUTIONAL: Well-developed, well-nourished female in no acute distress.  HENT:  Normocephalic, atraumatic, External right and left ear normal. Oropharynx is clear and moist EYES: Conjunctivae and EOM are normal. Pupils are equal, round, and reactive to light. No scleral icterus.  NECK: Normal range of motion, supple, no masses.  Normal thyroid.  SKIN: Skin is warm and dry. No rash noted. Not diaphoretic. No erythema. No pallor. MUSCULOSKELETAL: Normal range of motion. No tenderness.  No cyanosis, clubbing, or edema.  2+ distal pulses. NEUROLOGIC: Alert and oriented to person, place, and time. Normal reflexes, muscle tone coordination. No cranial nerve deficit noted. PSYCHIATRIC: Normal mood and affect. Normal behavior. Normal judgment and thought  content. CARDIOVASCULAR: Normal heart rate noted, regular rhythm RESPIRATORY: Clear to auscultation bilaterally. Effort and breath sounds normal, no problems with respiration noted. BREASTS: Symmetric in size. No masses, skin changes, nipple drainage, or lymphadenopathy. ABDOMEN: Soft, normal bowel sounds, no distention noted.  No tenderness, rebound or guarding.  PELVIC: Normal appearing external genitalia; normal appearing vaginal mucosa and cervix.  No abnormal discharge noted.  Pap smear obtained.  Normal uterine size, no other palpable masses, no uterine or adnexal tenderness.    Assessment and Plan:  1. Well woman exam with routine gynecological exam - Confirmed she is at normal BMI for height and build despite weight loss - Encouraged 30-45 min exercise most days of the week - Cytology - PAP - Declined CBC  2. Hemorrhoids - Intermittent, uncomplicated, related to straining -Not visualized today - Discussed Tucks medicated pads, stool softener and Miralax PRN, rx sent to pharmacy  3. 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 Routine preventative health maintenance measures emphasized. Please refer to After Visit Summary for other counseling recommendations.    Clayton BiblesSamantha Negan Grudzien, CNM 04/02/18 9:42 AM

## 2018-04-02 NOTE — Progress Notes (Signed)
Patient is in the office for annual, last pap 04-01-17. Pt is currently on depo, next due Oct 21- Nov 4th.

## 2018-04-08 LAB — CYTOLOGY - PAP
BACTERIAL VAGINITIS: POSITIVE — AB
Candida vaginitis: NEGATIVE
Chlamydia: NEGATIVE
Diagnosis: NEGATIVE
HPV (WINDOPATH): NOT DETECTED
NEISSERIA GONORRHEA: NEGATIVE

## 2018-04-09 ENCOUNTER — Other Ambulatory Visit: Payer: Self-pay | Admitting: Advanced Practice Midwife

## 2018-04-09 MED ORDER — METRONIDAZOLE 500 MG PO TABS: 500.0000 mg | ORAL_TABLET | Freq: Two times a day (BID) | ORAL | 0 refills | Status: DC

## 2018-04-09 NOTE — Progress Notes (Signed)
Cytology positive for BV, rx to patient pharmacy. Clinic messaged to call patient and notify her of prescription. Pt should continue Lactobacillus supplement as discussed at her annual.  Clayton Bibles, CNM 04/09/18 11:00 AM

## 2018-04-13 ENCOUNTER — Other Ambulatory Visit: Payer: Self-pay | Admitting: *Deleted

## 2018-04-13 MED ORDER — FLUCONAZOLE 150 MG PO TABS: 150.0000 mg | ORAL_TABLET | Freq: Once | ORAL | 0 refills | Status: AC

## 2018-04-13 NOTE — Progress Notes (Signed)
Pt made aware of lab results and Rx info. Pt request Diflucan to take after abx. Diflucan sent today.

## 2018-05-04 ENCOUNTER — Other Ambulatory Visit: Payer: Self-pay | Admitting: *Deleted

## 2018-05-04 ENCOUNTER — Other Ambulatory Visit: Payer: Self-pay | Admitting: Obstetrics

## 2018-05-04 MED ORDER — MEDROXYPROGESTERONE ACETATE 150 MG/ML IM SUSP: 150.0000 mg | INTRAMUSCULAR | 3 refills | Status: DC

## 2018-05-04 NOTE — Progress Notes (Signed)
Depo Rx sent for refill. Pt is up to date on AEX.

## 2018-05-06 ENCOUNTER — Ambulatory Visit: Payer: Medicaid Other

## 2018-05-10 ENCOUNTER — Ambulatory Visit: Payer: Medicaid Other

## 2018-05-12 ENCOUNTER — Ambulatory Visit (INDEPENDENT_AMBULATORY_CARE_PROVIDER_SITE_OTHER): Payer: Medicaid Other

## 2018-05-12 DIAGNOSIS — Z3042 Encounter for surveillance of injectable contraceptive: Secondary | ICD-10-CM

## 2018-05-12 MED ORDER — MEDROXYPROGESTERONE ACETATE 150 MG/ML IM SUSP
150.0000 mg | Freq: Once | INTRAMUSCULAR | Status: AC
  Administered 2018-05-12: 150 mg via INTRAMUSCULAR

## 2018-05-12 NOTE — Progress Notes (Signed)
Pt is here for depo injection. Injection is on time. Depo given in L deltoid, pt tolerated well. Pt advised to return in 3 months for next injection.

## 2018-06-05 ENCOUNTER — Other Ambulatory Visit: Payer: Self-pay

## 2018-06-05 ENCOUNTER — Emergency Department (HOSPITAL_COMMUNITY): Payer: Medicaid Other

## 2018-06-05 DIAGNOSIS — Y929 Unspecified place or not applicable: Secondary | ICD-10-CM | POA: Insufficient documentation

## 2018-06-05 DIAGNOSIS — W109XXA Fall (on) (from) unspecified stairs and steps, initial encounter: Secondary | ICD-10-CM | POA: Insufficient documentation

## 2018-06-05 DIAGNOSIS — Y939 Activity, unspecified: Secondary | ICD-10-CM | POA: Insufficient documentation

## 2018-06-05 DIAGNOSIS — Z79899 Other long term (current) drug therapy: Secondary | ICD-10-CM | POA: Insufficient documentation

## 2018-06-05 DIAGNOSIS — S93401A Sprain of unspecified ligament of right ankle, initial encounter: Secondary | ICD-10-CM | POA: Insufficient documentation

## 2018-06-05 DIAGNOSIS — Y999 Unspecified external cause status: Secondary | ICD-10-CM | POA: Insufficient documentation

## 2018-06-05 NOTE — ED Triage Notes (Signed)
Pt to ed with c/o of tripping upstairs and having her ankle roll. Pt stated she was caryring a tray of food when she tripped and foot turned in and she heard a "pop" Pt right ankle is swollen and pain is throbbing 7/10. Pt is able move her toes and feels them. Pt is A&O x4.

## 2018-06-06 ENCOUNTER — Emergency Department (HOSPITAL_COMMUNITY)
Admission: EM | Admit: 2018-06-06 | Discharge: 2018-06-06 | Disposition: A | Discharge status: Home / Self Care | Payer: Medicaid Other | Attending: Emergency Medicine | Admitting: Emergency Medicine

## 2018-06-06 DIAGNOSIS — S93401A Sprain of unspecified ligament of right ankle, initial encounter: Secondary | ICD-10-CM

## 2018-06-06 NOTE — ED Notes (Signed)
Pt took 2 of her 200 mg ibuprofen.

## 2018-06-06 NOTE — Discharge Instructions (Signed)
Can take motrin/ibuprofen for pain-- up to 800mg  3x daily. Follow-up with Dr. Ophelia CharterYates in the orthopedic office if any ongoing issues or not improving in the next week. Return to the ED for new or worsening symptoms.

## 2018-06-06 NOTE — ED Provider Notes (Signed)
[facility]-EMERGENCY DEPT Provider Note   CSN: [id number] Arrival date & time: 06/05/18  2157     History   Chief Complaint Chief Complaint  Patient presents with  . Ankle Injury    HPI Amy Davies is a 32 y.o. female.  The history is provided by the patient and medical records.  Ankle Injury      32 year old female presenting to the ED with right ankle pain.  States she was going down the steps, missed a step and fell while twisting her right ankle.  Reports pain and swelling on the lateral aspect of right ankle.  States she has been ambulatory but is limping.  She denies any numbness or tingling.  No prior right ankle injuries or surgeries in the past.  She did take some ibuprofen for pain and has been applying ice with some mild relief.  Past Medical History:  Diagnosis Date  . Irritable bowel syndrome     There are no active problems to display for this patient.   Past Surgical History:  Procedure Laterality Date  . CESAREAN SECTION N/A 09/06/2015   Procedure: CESAREAN SECTION;  Surgeon: Brock Bad, MD;  Location: [facility];  Service: Obstetrics;  Laterality: N/A;  . INDUCED ABORTION    . NO PAST SURGERIES       OB History    Gravida  6   Para  3   Term  2   Preterm  1   AB  3   Living  3     SAB  1   TAB  2   Ectopic  0   Multiple  0   Live Births  3            Home Medications    Prior to Admission medications   Medication Sig Start Date End Date Taking? Authorizing Provider  Acidophilus Lactobacillus CAPS Take 1 tablet by mouth daily as needed. 04/02/18   Calvert Cantor, CNM  docusate sodium (COLACE) 100 MG capsule Take 1 capsule (100 mg total) by mouth 2 (two) times daily. 04/02/18   Calvert Cantor, CNM  medroxyPROGESTERone (DEPO-PROVERA) 150 MG/ML injection INJECT 1 ML (150 MG TOTAL) INTO THE MUSCLE EVERY 3 (THREE) MONTHS. 05/04/18   Brock Bad, MD  medroxyPROGESTERone (DEPO-PROVERA)  150 MG/ML injection Inject 1 mL (150 mg total) into the muscle every 3 (three) months. 05/04/18   Brock Bad, MD  metroNIDAZOLE (FLAGYL) 500 MG tablet Take 1 tablet (500 mg total) by mouth 2 (two) times daily. 04/09/18   Calvert Cantor, CNM  polyethylene glycol [facility]) packet Take 17 g by mouth daily. 04/02/18   Calvert Cantor, CNM  sertraline (ZOLOFT) 100 MG tablet Take 1 tablet (100 mg total) by mouth daily. 11/30/17   Roe Coombs, CNM  Witch Hazel (TUCKS) 50 % PADS Apply 1 each topically as needed. 04/02/18   Calvert Cantor, CNM    Family History Family History  Problem Relation Age of Onset  . Asthma Neg Hx   . Diabetes Neg Hx   . Heart disease Neg Hx   . Hypertension Neg Hx   . Kidney disease Neg Hx   . Stroke Neg Hx   . Hearing loss Neg Hx     Social History Social History   Tobacco Use  . Smoking status: Former Smoker    Types: Cigarettes  . Smokeless tobacco: Never Used  . Tobacco comment: July 2016  Substance  Use Topics  . Alcohol use: Yes    Alcohol/week: 0.0 standard drinks    Comment: socially; not since +preg  . Drug use: No    Types: Marijuana    Comment: last was Aug 07/2014     Allergies   Patient has no known allergies.   Review of Systems Review of Systems  Musculoskeletal: Positive for arthralgias.  All other systems reviewed and are negative.    Physical Exam Updated Vital Signs BP 104/73 (BP Location: Right Arm)   Pulse (!) 104   Temp 98.1 F (36.7 C) (Oral)   Resp 18   Ht 5\' 7"  (1.702 m)   Wt 70.8 kg   SpO2 99%   BMI 24.43 kg/m   Physical Exam  Constitutional: She is oriented to person, place, and time. She appears well-developed and well-nourished.  HENT:  Head: Normocephalic and atraumatic.  Mouth/Throat: Oropharynx is clear and moist.  Eyes: Pupils are equal, round, and reactive to light. Conjunctivae and EOM are normal.  Neck: Normal range of motion.  Cardiovascular: Normal rate, regular rhythm  and normal heart sounds.  Pulmonary/Chest: Effort normal and breath sounds normal. No stridor. No respiratory distress.  Musculoskeletal: Normal range of motion.  Right ankle with swelling surrounding the lateral malleolus, there is no acute deformity, area is locally tender and pain with any attempted range of motion, DP pulse intact, moving toes normally, normal distal sensation and perfusion  Neurological: She is alert and oriented to person, place, and time.  Skin: Skin is warm and dry.  Psychiatric: She has a normal mood and affect.  Nursing note and vitals reviewed.    ED Treatments / Results  Labs (all labs ordered are listed, but only abnormal results are displayed) Labs Reviewed - No data to display  EKG None  Radiology Dg Ankle Complete Right  Result Date: 06/06/2018 CLINICAL DATA:  Tripping injury of the ankle. Pop sensation at the time of injury. EXAM: RIGHT ANKLE - COMPLETE 3+ VIEW COMPARISON:  None. FINDINGS: Abnormal soft tissue swelling over the lateral malleolus. No underlying fracture observed. Plafond and talar dome intact. IMPRESSION: 1. Soft tissue swelling overlying the lateral malleolus. No fracture identified Electronically Signed   By: Gaylyn RongWalter  Liebkemann M.D.   On: 06/06/2018 00:17    Procedures Procedures (including critical care time)  Medications Ordered in ED Medications - No data to display   Initial Impression / Assessment and Plan / ED Course  I have reviewed the triage vital signs and the nursing notes.  Pertinent labs & imaging results that were available during my care of the patient were reviewed by me and considered in my medical decision making (see chart for details).  [age] year old female here with right ankle injury.  Has some swelling around the lateral malleolus but no acute bony deformity.  Foot is neurovascular intact.  X-rays negative for any acute bony findings.  Suspect sprain.  Placed in ASO given crutches.  Will refer to  orthopedics for follow-up.  Recommended RICE routine and anti-inflammatories for symptomatic control.  She will return here for any new or worsening symptoms.  Final Clinical Impressions(s) / ED Diagnoses   Final diagnoses:  Sprain of right ankle, unspecified ligament, initial encounter    ED Discharge Orders    None       Garlon HatchetSanders, Suzzanne Brunkhorst M, PA-C 06/06/18 0121    Shon BatonHorton, Courtney F, MD 06/06/18 [phone]

## 2018-06-06 NOTE — ED Notes (Signed)
Bed: [facility]2 Expected date:  Expected time:  Means of arrival:  Comments: 

## 2018-08-03 ENCOUNTER — Ambulatory Visit (INDEPENDENT_AMBULATORY_CARE_PROVIDER_SITE_OTHER): Payer: Medicaid Other

## 2018-08-03 DIAGNOSIS — Z3042 Encounter for surveillance of injectable contraceptive: Secondary | ICD-10-CM

## 2018-08-03 MED ORDER — FLUCONAZOLE 150 MG PO TABS: 150.0000 mg | ORAL_TABLET | Freq: Once | ORAL | 3 refills | Status: AC

## 2018-08-03 NOTE — Progress Notes (Signed)
Nurse visit for pt supply Depo. Pt is on time for injection. Depo given R Del w/o difficulty. Next Depo due 4/8-4/22, pt agrees.   During visit, pt said she used [employer] adviser works [employer] and believes she has another yeast infection with the "cottage cheese-like" discharge and irritation. Denies odor. Diflucan sent to pharmacy per protocol. Pt aware if sx's worsens or do not subside, to contact the office for an exam.

## 2018-10-18 ENCOUNTER — Telehealth: Payer: Self-pay

## 2018-10-18 NOTE — Telephone Encounter (Signed)
This patient called in and reports she is having tooth pain. The patient reports that her dentist is closed and she cannot get in contact with them. Pt does not have a PCP. I advised pt that she would have to go to the hospital or urgent care to attend to this matter as she is not pregnant and our providers do not care for this type of issue.

## 2018-10-25 ENCOUNTER — Ambulatory Visit: Payer: Medicaid Other

## 2018-10-26 ENCOUNTER — Other Ambulatory Visit: Payer: Self-pay

## 2018-10-26 ENCOUNTER — Ambulatory Visit (INDEPENDENT_AMBULATORY_CARE_PROVIDER_SITE_OTHER): Payer: Medicaid Other

## 2018-10-26 DIAGNOSIS — Z3042 Encounter for surveillance of injectable contraceptive: Secondary | ICD-10-CM

## 2018-10-26 MED ORDER — MEDROXYPROGESTERONE ACETATE 150 MG/ML IM SUSP
150.0000 mg | Freq: Once | INTRAMUSCULAR | Status: AC
  Administered 2018-10-26: 15:00:00 150 mg via INTRAMUSCULAR

## 2018-10-26 NOTE — Progress Notes (Signed)
Nurse visit for pt's supply Depo. Pt is within her window.  Depo given L Del w/o difficulty.  Next Depo due Jun 30-Jul 14, pt agrees.

## 2018-11-01 NOTE — Progress Notes (Signed)
I have reviewed the chart and agree with nursing staff's documentation of this patient's encounter.  Oveta Idris, MD 11/01/2018 12:35 PM    

## 2019-02-04 ENCOUNTER — Ambulatory Visit (INDEPENDENT_AMBULATORY_CARE_PROVIDER_SITE_OTHER): Payer: Medicaid Other

## 2019-02-04 ENCOUNTER — Other Ambulatory Visit: Payer: Self-pay

## 2019-02-04 VITALS — BP 103/72 | HR 78 | Wt 161.4 lb

## 2019-02-04 DIAGNOSIS — Z3202 Encounter for pregnancy test, result negative: Secondary | ICD-10-CM

## 2019-02-04 LAB — POCT URINE PREGNANCY: Preg Test, Ur: NEGATIVE

## 2019-02-04 NOTE — Progress Notes (Signed)
Presents for 1st. UPT to restart DEPO. UPT today is NEGATIVE.    Patient is to abstain from sexual intercourse for 2 weeks and return for 2nd. UPT/DEPO.   Patient agrees.

## 2019-02-18 ENCOUNTER — Ambulatory Visit: Payer: Medicaid Other

## 2019-02-19 ENCOUNTER — Other Ambulatory Visit: Payer: Self-pay | Admitting: Obstetrics

## 2019-02-22 ENCOUNTER — Other Ambulatory Visit: Payer: Self-pay

## 2019-02-22 ENCOUNTER — Ambulatory Visit (INDEPENDENT_AMBULATORY_CARE_PROVIDER_SITE_OTHER): Payer: Medicaid Other

## 2019-02-22 DIAGNOSIS — Z3042 Encounter for surveillance of injectable contraceptive: Secondary | ICD-10-CM | POA: Diagnosis not present

## 2019-02-22 DIAGNOSIS — Z3202 Encounter for pregnancy test, result negative: Secondary | ICD-10-CM

## 2019-02-22 LAB — POCT URINE PREGNANCY: Preg Test, Ur: NEGATIVE

## 2019-02-22 MED ORDER — MEDROXYPROGESTERONE ACETATE 150 MG/ML IM SUSP: 150.0000 mg | INTRAMUSCULAR | 0 refills | Status: DC

## 2019-02-22 NOTE — Progress Notes (Signed)
Pt is in the office for 2 UPT, depo re-start. UPT is negative  Administered depo in R arm and pt tolerated well. Next due 10/27-11/10. Annual scheduled for 05-12-19. .. Administrations This Visit    medroxyPROGESTERone (DEPO-PROVERA) injection 150 mg    Admin Date 02/22/2019 Action Given Dose 150 mg Route Intramuscular Administered By Hinton Lovely, RN

## 2019-02-22 NOTE — Progress Notes (Signed)
Patient seen and assessed by nursing staff during this encounter. I have reviewed the chart and agree with the documentation and plan.  Mora Bellman, MD 02/22/2019 11:27 AM

## 2019-02-25 ENCOUNTER — Telehealth: Payer: Self-pay

## 2019-02-25 DIAGNOSIS — B379 Candidiasis, unspecified: Secondary | ICD-10-CM

## 2019-02-25 MED ORDER — FLUCONAZOLE 150 MG PO TABS: 150.0000 mg | ORAL_TABLET | Freq: Once | ORAL | 0 refills | Status: AC

## 2019-02-25 NOTE — Telephone Encounter (Signed)
Pt called stating that she is having some vaginal itching with discharge. She thinks that her soap has caused her to get a yeast infection. Pt request a diflucan to be sent to the pharmacy. Rx sent per protocol.

## 2019-05-12 ENCOUNTER — Encounter: Payer: Self-pay | Admitting: Obstetrics and Gynecology

## 2019-05-12 ENCOUNTER — Other Ambulatory Visit: Payer: Self-pay

## 2019-05-12 ENCOUNTER — Ambulatory Visit (INDEPENDENT_AMBULATORY_CARE_PROVIDER_SITE_OTHER): Payer: Medicaid Other | Admitting: Obstetrics and Gynecology

## 2019-05-12 ENCOUNTER — Other Ambulatory Visit (HOSPITAL_COMMUNITY)
Admission: RE | Admit: 2019-05-12 | Discharge: 2019-05-12 | Disposition: A | Discharge status: Home / Self Care | Payer: Medicaid Other | Source: Ambulatory Visit | Attending: Obstetrics and Gynecology | Admitting: Obstetrics and Gynecology

## 2019-05-12 VITALS — Ht 66.0 in

## 2019-05-12 DIAGNOSIS — Z124 Encounter for screening for malignant neoplasm of cervix: Secondary | ICD-10-CM

## 2019-05-12 DIAGNOSIS — Z01419 Encounter for gynecological examination (general) (routine) without abnormal findings: Secondary | ICD-10-CM

## 2019-05-12 DIAGNOSIS — Z113 Encounter for screening for infections with a predominantly sexual mode of transmission: Secondary | ICD-10-CM | POA: Insufficient documentation

## 2019-05-12 DIAGNOSIS — Z3042 Encounter for surveillance of injectable contraceptive: Secondary | ICD-10-CM

## 2019-05-12 DIAGNOSIS — Z0000 Encounter for general adult medical examination without abnormal findings: Secondary | ICD-10-CM

## 2019-05-12 DIAGNOSIS — N898 Other specified noninflammatory disorders of vagina: Secondary | ICD-10-CM

## 2019-05-12 DIAGNOSIS — R8290 Unspecified abnormal findings in urine: Secondary | ICD-10-CM

## 2019-05-12 LAB — POCT URINALYSIS DIPSTICK
Bilirubin, UA: NEGATIVE
Glucose, UA: NEGATIVE
Ketones, UA: NEGATIVE
Leukocytes, UA: NEGATIVE
Nitrite, UA: POSITIVE
Protein, UA: POSITIVE — AB
Spec Grav, UA: 1.01 (ref 1.010–1.025)
Urobilinogen, UA: 0.2 E.U./dL
pH, UA: 7.5 (ref 5.0–8.0)

## 2019-05-12 MED ORDER — MEDROXYPROGESTERONE ACETATE 150 MG/ML IM SUSP: 150.0000 mg | INTRAMUSCULAR | 4 refills | Status: DC

## 2019-05-12 NOTE — Progress Notes (Signed)
Patient presents for Annual Exam Today.  LMP: none w/ depo  Last pap:04/02/2018 per pt no Hx of Abnormal pap. Contraception:Depo Due today Last Depo 02/22/19 window (05/10/19-05/24/19) STD Screening: Desires Full Panel  Denies any Family Hx of Breast Cancer Flu Vaccine: Declines   CC: pt notes odor in urine sometimes.   Pt notes recurrent BV.and vaginal discharge.

## 2019-05-12 NOTE — Patient Instructions (Signed)

## 2019-05-12 NOTE — Progress Notes (Signed)
GYNECOLOGY ANNUAL PREVENTATIVE CARE ENCOUNTER NOTE  Subjective:   Amy Davies is a 33 y.o. [phone] [id number] female here for a annual gynecologic exam. Current complaints: vaginal discharge and occasional urinary odor. Currently happening. Does not get periods with depo, has been on it 16 years. Happy with depo. Denies abnormal vaginal bleeding, pelvic pain, problems with intercourse or other gynecologic concerns.    Gynecologic History No LMP recorded. Patient has had an injection. Contraception: Depo-Provera injections Last Pap: 03/2018. Results were: normal Last mammogram: n/a  Obstetric History OB History  Gravida Para Term Preterm AB Living  6 3 2 1 3 3   SAB TAB Ectopic Multiple Live Births  1 2 0 0 3    # Outcome Date GA Lbr Len/2nd Weight Sex Delivery Anes PTL Lv  6 Preterm 09/06/15 [redacted]w[redacted]d  4 lb 12.5 oz (2.17 kg) F CS-LTranv Spinal  LIV  5 Term 07/17/10 9579w0d  8 lb (3.629 kg) M Vag-Spont EPI N LIV  4 SAB 2009 4036w0d    SAB     3 Term 04/12/03 7679w0d  7 lb 9 oz (3.43 kg) M Vag-Spont EPI N LIV  2 TAB           1 TAB             Past Medical History:  Diagnosis Date  . Irritable bowel syndrome     Past Surgical History:  Procedure Laterality Date  . CESAREAN SECTION N/A 09/06/2015   Procedure: CESAREAN SECTION;  Surgeon: Brock Badharles A Harper, MD;  Location: [facility];  Service: Obstetrics;  Laterality: N/A;  . INDUCED ABORTION    . NO PAST SURGERIES      Current Outpatient Medications on File Prior to Visit  Medication Sig Dispense Refill  . Acidophilus Lactobacillus CAPS Take 1 tablet by mouth daily as needed. (Patient not taking: Reported on 02/22/2019) 30 capsule 3  . docusate sodium (COLACE) 100 MG capsule Take 1 capsule (100 mg total) by mouth 2 (two) times daily. (Patient not taking: Reported on 02/22/2019) 10 capsule 0  . medroxyPROGESTERone (DEPO-PROVERA) 150 MG/ML injection INJECT 1 ML INTO THE MUSCLE EVERY 3 MONTHS. (Patient not taking: Reported on 02/22/2019) 1 mL 3  .  medroxyPROGESTERone (DEPO-PROVERA) 150 MG/ML injection Inject 1 mL (150 mg total) into the muscle every 3 (three) months. 1 mL 0  . metroNIDAZOLE (FLAGYL) 500 MG tablet Take 1 tablet (500 mg total) by mouth 2 (two) times daily. (Patient not taking: Reported on 02/22/2019) 14 tablet 0  . polyethylene glycol (MIRALAX) packet Take 17 g by mouth daily. (Patient not taking: Reported on 02/22/2019) 14 each 0  . sertraline (ZOLOFT) 100 MG tablet Take 1 tablet (100 mg total) by mouth daily. (Patient not taking: Reported on 02/22/2019) 30 tablet 2  . Witch Hazel (TUCKS) 50 % PADS Apply 1 each topically as needed. (Patient not taking: Reported on 02/22/2019) 1 each 3   Current Facility-Administered Medications on File Prior to Visit  Medication Dose Route Frequency Provider Last Rate Last Dose  . medroxyPROGESTERone (DEPO-PROVERA) injection 150 mg  150 mg Intramuscular Q90 days Orvilla CornwallDenney, Rachelle A, CNM   150 mg at 05/12/19 1005    No Known Allergies  Social History   Socioeconomic History  . Marital status: Single    Spouse name: Not on file  . Number of children: Not on file  . Years of education: Not on file  . Highest education level: Not on file  Occupational History  . Not  on file  Social Needs  . Financial resource strain: Not on file  . Food insecurity    Worry: Not on file    Inability: Not on file  . Transportation needs    Medical: Not on file    Non-medical: Not on file  Tobacco Use  . Smoking status: Former Smoker    Types: Cigarettes  . Smokeless tobacco: Never Used  . Tobacco comment: July 2016  Substance and Sexual Activity  . Alcohol use: Yes    Alcohol/week: 0.0 standard drinks    Comment: socially; not since +preg  . Drug use: No    Types: Marijuana    Comment: last was Aug 07/2014  . Sexual activity: Yes    Birth control/protection: Injection  Lifestyle  . Physical activity    Days per week: Not on file    Minutes per session: Not on file  . Stress: Not on file   Relationships  . Social Herbalist on phone: Not on file    Gets together: Not on file    Attends religious service: Not on file    Active member of club or organization: Not on file    Attends meetings of clubs or organizations: Not on file    Relationship status: Not on file  . Intimate partner violence    Fear of current or ex partner: Not on file    Emotionally abused: Not on file    Physically abused: Not on file    Forced sexual activity: Not on file  Other Topics Concern  . Not on file  Social History Narrative  . Not on file    Family History  Problem Relation Age of Onset  . Asthma Neg Hx   . Diabetes Neg Hx   . Heart disease Neg Hx   . Hypertension Neg Hx   . Kidney disease Neg Hx   . Stroke Neg Hx   . Hearing loss Neg Hx    The following portions of the patient's history were reviewed and updated as appropriate: allergies, current medications, past family history, past medical history, past social history, past surgical history and problem list.  Review of Systems Pertinent items are noted in HPI.   Objective:  Ht 5\' 6"  (1.676 m)   BMI 26.05 kg/m  CONSTITUTIONAL: Well-developed, well-nourished female in no acute distress.  HENT:  Normocephalic, atraumatic, External right and left ear normal. Oropharynx is clear and moist EYES: Conjunctivae and EOM are normal. Pupils are equal, round, and reactive to light. No scleral icterus.  NECK: Normal range of motion, supple, no masses.  Normal thyroid.  SKIN: Skin is warm and dry. No rash noted. Not diaphoretic. No erythema. No pallor. NEUROLOGIC: Alert and oriented to person, place, and time. Normal reflexes, muscle tone coordination. No cranial nerve deficit noted. PSYCHIATRIC: Normal mood and affect. Normal behavior. Normal judgment and thought content. CARDIOVASCULAR: Normal heart rate noted, regular rhythm RESPIRATORY: Clear to auscultation bilaterally. Effort and breath sounds normal, no problems with  respiration noted. BREASTS: Symmetric in size. No masses, skin changes, nipple drainage, or lymphadenopathy. ABDOMEN: Soft, normal bowel sounds, no distention noted.  No tenderness, rebound or guarding.  PELVIC: Normal appearing external genitalia; normal appearing vaginal mucosa and cervix.  No abnormal discharge noted.  Pap smear obtained.  Normal uterine size, no other palpable masses, no uterine or adnexal tenderness. MUSCULOSKELETAL: Normal range of motion. No tenderness.  No cyanosis, clubbing, or edema.  2+ distal pulses.  Exam  done with chaperone present.  Assessment and Plan:   1. Screening for STD (sexually transmitted disease) - Cervicovaginal ancillary only( [place]) - Hepatitis B surface antigen - Hepatitis C antibody - HIV Antibody (routine testing w rflx) - RPR  2. Encounter for annual routine gynecological examination No acute issues Counseled regarding risks/benefits of flu vaccine, patient declines vaccine.   3. Encounter for surveillance of injectable contraceptive - Reviewed risks/benefits of long term use of depo, including decreased bone density, she would like to continue depo, gave info for IUDs, she will consider - to start women's multivitamin  4. Abnormal urine odor Urine culture - POCT Urinalysis Dipstick  5. Screening for cervical cancer - Cytology - PAP  6. Vaginal discharge Yeast/BV testing To come in for self-swab with any further symptoms Reviewed possibility of suppressive medication   Will follow up results of pap smear/STI screen and manage accordingly. Encouraged improvement in diet and exercise.  Mammogram n/a Flu vaccine declined  Routine preventative health maintenance measures emphasized. Please refer to After Visit Summary for other counseling recommendations.   Total face-to-face time with patient: 30 minutes. Over 50% of encounter was spent on counseling and coordination of care.   Baldemar Lenis, M.D. Attending Center  for [employer] [facility])

## 2019-05-13 LAB — CERVICOVAGINAL ANCILLARY ONLY
Bacterial Vaginitis (gardnerella): NEGATIVE
Candida Glabrata: NEGATIVE
Candida Vaginitis: NEGATIVE
Chlamydia: NEGATIVE
Comment: NEGATIVE
Comment: NEGATIVE
Comment: NEGATIVE
Comment: NEGATIVE
Comment: NEGATIVE
Comment: NORMAL
Neisseria Gonorrhea: NEGATIVE
Trichomonas: NEGATIVE

## 2019-05-13 LAB — HEPATITIS C ANTIBODY: Hep C Virus Ab: <0.1 s/co ratio (ref 0.0–0.9)

## 2019-05-13 LAB — HIV ANTIBODY (ROUTINE TESTING W REFLEX): HIV Screen 4th Generation wRfx: NONREACTIVE

## 2019-05-13 LAB — RPR: RPR Ser Ql: NONREACTIVE

## 2019-05-13 LAB — HEPATITIS B SURFACE ANTIGEN: Hepatitis B Surface Ag: NEGATIVE

## 2019-05-14 LAB — URINE CULTURE

## 2019-05-16 ENCOUNTER — Other Ambulatory Visit: Payer: Self-pay

## 2019-05-16 ENCOUNTER — Telehealth: Payer: Self-pay

## 2019-05-16 MED ORDER — FLUCONAZOLE 150 MG PO TABS: 150.0000 mg | ORAL_TABLET | ORAL | 1 refills | Status: AC

## 2019-05-16 MED ORDER — NITROFURANTOIN MONOHYD MACRO 100 MG PO CAPS: 100.0000 mg | ORAL_CAPSULE | Freq: Two times a day (BID) | ORAL | 0 refills | Status: DC

## 2019-05-16 NOTE — Telephone Encounter (Signed)
  Left VM message to call Office. Resulta are normal

## 2019-05-16 NOTE — Telephone Encounter (Signed)
-----   Message from Sloan Leiter, MD sent at 05/13/2019  7:38 PM EDT ----- Neg HIV, Syphilis, Hepatitis B & C Neg STI screen Please call and let patient know

## 2019-05-16 NOTE — Addendum Note (Signed)
Addended by: Vivien Rota on: 05/16/2019 01:39 PM   Modules accepted: Orders

## 2019-05-16 NOTE — Telephone Encounter (Signed)
  Rx for Diflucan sent to Pharmacy per Protocol.  Patient notified of Results and RX for UTI.

## 2019-05-17 ENCOUNTER — Telehealth: Payer: Self-pay

## 2019-05-17 NOTE — Telephone Encounter (Signed)
  spoke with pt. She has [employer] which does cover Rx.  Advised her to get coupon off [employer], she found one for [facility]

## 2019-05-18 LAB — CYTOLOGY - PAP
Comment: NEGATIVE
Diagnosis: NEGATIVE
High risk HPV: NEGATIVE

## 2019-06-28 ENCOUNTER — Other Ambulatory Visit: Payer: Self-pay | Admitting: *Deleted

## 2019-06-28 DIAGNOSIS — B379 Candidiasis, unspecified: Secondary | ICD-10-CM

## 2019-06-28 MED ORDER — FLUCONAZOLE 150 MG PO TABS: 150.0000 mg | ORAL_TABLET | Freq: Once | ORAL | 0 refills | Status: AC

## 2019-06-28 NOTE — Progress Notes (Signed)
Pt called to office with symptoms of yeast inf. Tx, Diflucan, sent per protocol.  Pt advised to call if symptoms do not resolve, will need appt.

## 2019-08-03 ENCOUNTER — Telehealth: Payer: Self-pay | Admitting: *Deleted

## 2019-08-03 ENCOUNTER — Ambulatory Visit: Payer: Medicaid Other

## 2019-08-03 ENCOUNTER — Other Ambulatory Visit: Payer: Self-pay

## 2019-08-03 DIAGNOSIS — Z3042 Encounter for surveillance of injectable contraceptive: Secondary | ICD-10-CM

## 2019-08-03 MED ORDER — MEDROXYPROGESTERONE ACETATE 150 MG/ML IM SUSP: 150.0000 mg | INTRAMUSCULAR | 4 refills | Status: DC

## 2019-08-03 NOTE — Telephone Encounter (Signed)
Pt called office for refill on Depo for her appt.  Attempt to return call. LM on VM making pt aware to contact pharmacy for refills- she has year supply. Advised to call office and reschedule appt as she was on the schedule for today and missed appt.

## 2019-08-05 ENCOUNTER — Ambulatory Visit (INDEPENDENT_AMBULATORY_CARE_PROVIDER_SITE_OTHER): Payer: Medicaid Other

## 2019-08-05 ENCOUNTER — Other Ambulatory Visit: Payer: Self-pay

## 2019-08-05 DIAGNOSIS — Z3042 Encounter for surveillance of injectable contraceptive: Secondary | ICD-10-CM

## 2019-08-05 MED ORDER — MEDROXYPROGESTERONE ACETATE 150 MG/ML IM SUSP
150.0000 mg | Freq: Once | INTRAMUSCULAR | Status: AC
  Administered 2019-08-05: 150 mg via INTRAMUSCULAR

## 2019-08-05 NOTE — Progress Notes (Signed)
Pt is here for depo. She tolerated injection well with no complications. Pt advised to make next appointment around April 9-23. -EH/RMA

## 2019-08-30 ENCOUNTER — Other Ambulatory Visit: Payer: Self-pay

## 2019-08-30 ENCOUNTER — Ambulatory Visit
Admission: EM | Admit: 2019-08-30 | Discharge: 2019-08-30 | Disposition: A | Discharge status: Home / Self Care | Payer: 59 | Attending: Physician Assistant | Admitting: Physician Assistant

## 2019-08-30 DIAGNOSIS — R059 Cough, unspecified: Secondary | ICD-10-CM

## 2019-08-30 DIAGNOSIS — J349 Unspecified disorder of nose and nasal sinuses: Secondary | ICD-10-CM | POA: Diagnosis not present

## 2019-08-30 DIAGNOSIS — Z20822 Contact with and (suspected) exposure to covid-19: Secondary | ICD-10-CM

## 2019-08-30 DIAGNOSIS — R0981 Nasal congestion: Secondary | ICD-10-CM | POA: Diagnosis not present

## 2019-08-30 DIAGNOSIS — J0100 Acute maxillary sinusitis, unspecified: Secondary | ICD-10-CM | POA: Diagnosis not present

## 2019-08-30 DIAGNOSIS — F17200 Nicotine dependence, unspecified, uncomplicated: Secondary | ICD-10-CM

## 2019-08-30 DIAGNOSIS — H66002 Acute suppurative otitis media without spontaneous rupture of ear drum, left ear: Secondary | ICD-10-CM

## 2019-08-30 DIAGNOSIS — R05 Cough: Secondary | ICD-10-CM

## 2019-08-30 DIAGNOSIS — J3489 Other specified disorders of nose and nasal sinuses: Secondary | ICD-10-CM

## 2019-08-30 DIAGNOSIS — B9789 Other viral agents as the cause of diseases classified elsewhere: Secondary | ICD-10-CM

## 2019-08-30 MED ORDER — FLUCONAZOLE 150 MG PO TABS: 150.0000 mg | ORAL_TABLET | Freq: Every day | ORAL | 0 refills | Status: DC

## 2019-08-30 MED ORDER — AMOXICILLIN-POT CLAVULANATE 875-125 MG PO TABS: 1.0000 | ORAL_TABLET | Freq: Two times a day (BID) | ORAL | 0 refills | Status: DC

## 2019-08-30 MED ORDER — NEOMYCIN-POLYMYXIN-HC 3.5-10000-1 OT SUSP: 4.0000 [drp] | Freq: Three times a day (TID) | OTIC | 0 refills | Status: AC

## 2019-08-30 MED ORDER — AZELASTINE HCL 0.1 % NA SOLN: 2.0000 | Freq: Two times a day (BID) | NASAL | 0 refills | Status: DC

## 2019-08-30 NOTE — ED Provider Notes (Signed)
[name]    CSN: [id number] Arrival date & time: 08/30/19  0848      History   Chief Complaint Chief Complaint  Patient presents with  . Cough    HPI Amy Davies is a 34 y.o. female.   34 year old female comes in for 1.5 week history of URI symptoms. Has had cough, throat irritation, nasal congestion, sinus pressure. Denies fever, chills, body aches. Denies abdominal pain, nausea, vomiting, diarrhea. Denies shortness of breath, loss of taste/smell. Symptoms without much improvement. otc sinus medications without relief. Occasional smoker.      Past Medical History:  Diagnosis Date  . Irritable bowel syndrome     There are no problems to display for this patient.   Past Surgical History:  Procedure Laterality Date  . CESAREAN SECTION N/A 09/06/2015   Procedure: CESAREAN SECTION;  Surgeon: Brock Bad, MD;  Location: [facility];  Service: Obstetrics;  Laterality: N/A;  . INDUCED ABORTION    . NO PAST SURGERIES      OB History    Gravida  6   Para  3   Term  2   Preterm  1   AB  3   Living  3     SAB  1   TAB  2   Ectopic  0   Multiple  0   Live Births  3            Home Medications    Prior to Admission medications   Medication Sig Start Date End Date Taking? Authorizing Provider  amoxicillin-clavulanate (AUGMENTIN) 875-125 MG tablet Take 1 tablet by mouth every 12 (twelve) hours. 08/30/19   Cathie Hoops, Caragh Gasper V, PA-C  azelastine (ASTELIN) 0.1 % nasal spray Place 2 sprays into both nostrils 2 (two) times daily. 08/30/19   Cathie Hoops, Ohm Dentler V, PA-C  fluconazole (DIFLUCAN) 150 MG tablet Take 1 tablet (150 mg total) by mouth daily. Take second dose 72 hours later if symptoms still persists. 08/30/19   Cathie Hoops, Marlayna Bannister V, PA-C  medroxyPROGESTERone (DEPO-PROVERA) 150 MG/ML injection Inject 1 mL (150 mg total) into the muscle every 3 (three) months. 08/03/19   Constant, Peggy, MD  neomycin-polymyxin-hydrocortisone (CORTISPORIN) 3.5-10000-1 OTIC suspension Place  4 drops into the left ear 3 (three) times daily for 7 days. 08/30/19 09/06/19  Belinda Fisher, PA-C    Family History Family History  Problem Relation Age of Onset  . Asthma Neg Hx   . Diabetes Neg Hx   . Heart disease Neg Hx   . Hypertension Neg Hx   . Kidney disease Neg Hx   . Stroke Neg Hx   . Hearing loss Neg Hx     Social History Social History   Tobacco Use  . Smoking status: Former Smoker    Types: Cigarettes  . Smokeless tobacco: Never Used  . Tobacco comment: July 2016  Substance Use Topics  . Alcohol use: Yes    Alcohol/week: 0.0 standard drinks    Comment: socially; not since +preg  . Drug use: No    Types: Marijuana    Comment: last was Aug 07/2014     Allergies   Patient has no known allergies.   Review of Systems Review of Systems  Reason unable to perform ROS: See HPI as above.     Physical Exam Triage Vital Signs ED Triage Vitals [08/30/19 0903]  Enc Vitals Group     BP 127/83     Pulse Rate 87  Resp 16     Temp 98.2 F (36.8 C)     Temp Source Oral     SpO2 97 %     Weight      Height      Head Circumference      Peak Flow      Pain Score 5     Pain Loc      Pain Edu?      Excl. in [place]?    No data found.  Updated Vital Signs BP 127/83 (BP Location: Left Arm)   Pulse 87   Temp 98.2 F (36.8 C) (Oral)   Resp 16   SpO2 97%   Physical Exam Constitutional:      General: She is not in acute distress.    Appearance: Normal appearance. She is not ill-appearing, toxic-appearing or diaphoretic.  HENT:     Head: Normocephalic and atraumatic.     Ears:     Comments: Bilateral ear without tenderness to tragus. Cerumen impaction, TM not visible.  Post ear irritation. Right ear canal normal, TM normal without erythema or bulging. Left ear canal with erythema/mild swelling, TM visible with erythema and bulging.     Nose:     Right Sinus: Maxillary sinus tenderness present. No frontal sinus tenderness.     Left Sinus: Maxillary sinus  tenderness present. No frontal sinus tenderness.     Mouth/Throat:     Mouth: Mucous membranes are moist.     Pharynx: Oropharynx is clear. Uvula midline.  Cardiovascular:     Rate and Rhythm: Normal rate and regular rhythm.     Heart sounds: Normal heart sounds. No murmur. No friction rub. No gallop.   Pulmonary:     Effort: Pulmonary effort is normal. No accessory muscle usage, prolonged expiration, respiratory distress or retractions.     Comments: Lungs clear to auscultation without adventitious lung sounds. Musculoskeletal:     Cervical back: Normal range of motion and neck supple.  Neurological:     General: No focal deficit present.     Mental Status: She is alert and oriented to person, place, and time.      UC Treatments / Results  Labs (all labs ordered are listed, but only abnormal results are displayed) Labs Reviewed  NOVEL CORONAVIRUS, NAA    EKG   Radiology No results found.  Procedures Procedures (including critical care time)  Medications Ordered in UC Medications - No data to display  Initial Impression / Assessment and Plan / UC Course  I have reviewed the triage vital signs and the nursing notes.  Pertinent labs & imaging results that were available during my care of the patient were reviewed by me and considered in my medical decision making (see chart for details).    COVID PCR test ordered. Patient to quarantine until testing results return. No alarming signs on exam.  Patient speaking in full sentences without respiratory distress. Augmentin for sinusitis/left otitis media. Left ear canal erythema/swelling most likely as result from irritation. However, will provide Rx of cortipsorin, can start if symptoms not improving. Other symptomatic treatment discussed.  Push fluids.  Return precautions given.  Patient expresses understanding and agrees to plan.  Final Clinical Impressions(s) / UC Diagnoses   Final diagnoses:  Cough  Nasal congestion   Sinus pressure  Acute non-recurrent maxillary sinusitis  Non-recurrent acute suppurative otitis media of left ear without spontaneous rupture of tympanic membrane   ED Prescriptions    Medication Sig Dispense Auth. Provider  amoxicillin-clavulanate (AUGMENTIN) 875-125 MG tablet Take 1 tablet by mouth every 12 (twelve) hours. 14 tablet Maurita Havener V, PA-C   fluconazole (DIFLUCAN) 150 MG tablet Take 1 tablet (150 mg total) by mouth daily. Take second dose 72 hours later if symptoms still persists. 2 tablet Tylie Golonka V, PA-C   azelastine (ASTELIN) 0.1 % nasal spray Place 2 sprays into both nostrils 2 (two) times daily. 30 mL Levy Cedano V, PA-C   neomycin-polymyxin-hydrocortisone (CORTISPORIN) 3.5-10000-1 OTIC suspension Place 4 drops into the left ear 3 (three) times daily for 7 days. 4.2 mL Belinda Fisher, PA-C     PDMP not reviewed this encounter.   Belinda Fisher, PA-C 08/30/19 [id number]

## 2019-08-30 NOTE — ED Triage Notes (Signed)
Pt c/o cough, scratchy throat, nasal congestion and headache for a week and a half.

## 2019-08-30 NOTE — Discharge Instructions (Addendum)
COVID PCR testing ordered. I would like you to quarantine until testing results. Augmentin for sinus infection, which will also treat your left ear infection. Azelastine nasal spray for sinus pressure, ear fullness. Your ear canal is irritated due to flushing today, you can start cortisporin to prevent ear canal infection, or you can monitor and start if having worsening ear pain.  You can take over the counter flonase/nasacort to help with nasal congestion/drainage. Tylenol/motrin for pain and fever. Keep hydrated, urine should be clear to pale yellow in color. If experiencing shortness of breath, trouble breathing, go to the emergency department for further evaluation needed.

## 2019-08-31 LAB — NOVEL CORONAVIRUS, NAA: SARS-CoV-2, NAA: NOT DETECTED

## 2019-10-24 ENCOUNTER — Other Ambulatory Visit: Payer: Self-pay

## 2019-10-24 ENCOUNTER — Ambulatory Visit (INDEPENDENT_AMBULATORY_CARE_PROVIDER_SITE_OTHER): Payer: 59

## 2019-10-24 DIAGNOSIS — Z3042 Encounter for surveillance of injectable contraceptive: Secondary | ICD-10-CM

## 2019-10-24 MED ORDER — MEDROXYPROGESTERONE ACETATE 150 MG/ML IM SUSP
150.0000 mg | Freq: Once | INTRAMUSCULAR | Status: AC
  Administered 2019-10-24: 150 mg via INTRAMUSCULAR

## 2019-10-24 NOTE — Progress Notes (Signed)
Patient seen and assessed by nursing staff during this encounter. I have reviewed the chart and agree with the documentation and plan. I have also made any necessary editorial changes.  Catalina Antigua, MD 10/24/2019 11:12 AM

## 2019-10-24 NOTE — Progress Notes (Signed)
Amy Davies is here for a depo injection.  Pt tolerated injection well in the in the LD without complication.  Pt advised that next injection is due between June 28 and July 12. Pt verbalized understanding. -EH/RMA

## 2020-01-18 ENCOUNTER — Other Ambulatory Visit: Payer: Self-pay

## 2020-01-18 ENCOUNTER — Ambulatory Visit (INDEPENDENT_AMBULATORY_CARE_PROVIDER_SITE_OTHER): Payer: 59

## 2020-01-18 VITALS — BP 119/80 | HR 90 | Ht 67.0 in | Wt 164.0 lb

## 2020-01-18 DIAGNOSIS — Z3042 Encounter for surveillance of injectable contraceptive: Secondary | ICD-10-CM | POA: Diagnosis not present

## 2020-01-18 MED ORDER — MEDROXYPROGESTERONE ACETATE 150 MG/ML IM SUSP
150.0000 mg | Freq: Once | INTRAMUSCULAR | Status: AC
  Administered 2020-01-18: 150 mg via INTRAMUSCULAR

## 2020-01-18 NOTE — Progress Notes (Signed)
GYN presents for DEPO, given in RD, tolerated well.  Annual Exam scheduled for 04/04/2020  Next DEPO Sept. 22 - Oct. 6, 2021  Administrations This Visit    medroxyPROGESTERone (DEPO-PROVERA) injection 150 mg    Admin Date 01/18/2020 Action Given Dose 150 mg Route Intramuscular Administered By Maretta Bees, RMA

## 2020-01-19 NOTE — Progress Notes (Signed)
I have reviewed this chart and agree with the RN/CMA assessment and management.    K. Meryl Monteen Toops, M.D. Attending [facility] (Faculty Practice)   

## 2020-02-14 ENCOUNTER — Ambulatory Visit: Payer: 59

## 2020-03-13 ENCOUNTER — Other Ambulatory Visit: Payer: Self-pay | Admitting: *Deleted

## 2020-03-13 MED ORDER — FLUCONAZOLE 150 MG PO TABS: 150.0000 mg | ORAL_TABLET | Freq: Once | ORAL | 0 refills | Status: DC

## 2020-03-13 NOTE — Progress Notes (Signed)
Pt called to office needing yeast treatment after using bath bomb. Pt states she is having vaginal itching with some hemorrhoid involvement. Diflucan sent per protocol. Pt advised she may use Tucks pads or Witch Hazel for hemorrhoids.

## 2020-04-04 ENCOUNTER — Ambulatory Visit: Payer: 59 | Admitting: Obstetrics

## 2020-04-04 ENCOUNTER — Other Ambulatory Visit: Payer: Self-pay

## 2020-04-04 ENCOUNTER — Ambulatory Visit (INDEPENDENT_AMBULATORY_CARE_PROVIDER_SITE_OTHER): Payer: 59 | Admitting: Women's Health

## 2020-04-04 ENCOUNTER — Other Ambulatory Visit (HOSPITAL_COMMUNITY)
Admission: RE | Admit: 2020-04-04 | Discharge: 2020-04-04 | Disposition: A | Discharge status: Home / Self Care | Payer: 59 | Source: Ambulatory Visit | Attending: Obstetrics | Admitting: Obstetrics

## 2020-04-04 ENCOUNTER — Encounter: Payer: Self-pay | Admitting: Women's Health

## 2020-04-04 VITALS — BP 123/88 | HR 88 | Wt 172.2 lb

## 2020-04-04 DIAGNOSIS — Z01419 Encounter for gynecological examination (general) (routine) without abnormal findings: Secondary | ICD-10-CM

## 2020-04-04 DIAGNOSIS — Z0000 Encounter for general adult medical examination without abnormal findings: Secondary | ICD-10-CM

## 2020-04-04 DIAGNOSIS — Z113 Encounter for screening for infections with a predominantly sexual mode of transmission: Secondary | ICD-10-CM | POA: Diagnosis present

## 2020-04-04 DIAGNOSIS — Z3042 Encounter for surveillance of injectable contraceptive: Secondary | ICD-10-CM | POA: Diagnosis not present

## 2020-04-04 NOTE — Patient Instructions (Addendum)
Preventive Care 20-36 Years Old, Female Preventive care refers to visits with your health care provider and lifestyle choices that can promote health and wellness. This includes:  A yearly physical exam. This may also be called an annual well check.  Regular dental visits and eye exams.  Immunizations.  Screening for certain conditions.  Healthy lifestyle choices, such as eating a healthy diet, getting regular exercise, not using drugs or products that contain nicotine and tobacco, and limiting alcohol use. What can I expect for my preventive care visit? Physical exam Your health care provider will check your:  Height and weight. This may be used to calculate body mass index (BMI), which tells if you are at a healthy weight.  Heart rate and blood pressure.  Skin for abnormal spots. Counseling Your health care provider may ask you questions about your:  Alcohol, tobacco, and drug use.  Emotional well-being.  Home and relationship well-being.  Sexual activity.  Eating habits.  Work and work Statistician.  Method of birth control.  Menstrual cycle.  Pregnancy history. What immunizations do I need?  Influenza (flu) vaccine  This is recommended every year. Tetanus, diphtheria, and pertussis (Tdap) vaccine  You may need a Td booster every 10 years. Varicella (chickenpox) vaccine  You may need this if you have not been vaccinated. Human papillomavirus (HPV) vaccine  If recommended by your health care provider, you may need three doses over 6 months. Measles, mumps, and rubella (MMR) vaccine  You may need at least one dose of MMR. You may also need a second dose. Meningococcal conjugate (MenACWY) vaccine  One dose is recommended if you are age 75-21 years and a first-year college student living in a residence hall, or if you have one of several medical conditions. You may also need additional booster doses. Pneumococcal conjugate (PCV13) vaccine  You may need  this if you have certain conditions and were not previously vaccinated. Pneumococcal polysaccharide (PPSV23) vaccine  You may need one or two doses if you smoke cigarettes or if you have certain conditions. Hepatitis A vaccine  You may need this if you have certain conditions or if you travel or work in places where you may be exposed to hepatitis A. Hepatitis B vaccine  You may need this if you have certain conditions or if you travel or work in places where you may be exposed to hepatitis B. Haemophilus influenzae type b (Hib) vaccine  You may need this if you have certain conditions. You may receive vaccines as individual doses or as more than one vaccine together in one shot (combination vaccines). Talk with your health care provider about the risks and benefits of combination vaccines. What tests do I need?  Blood tests  Lipid and cholesterol levels. These may be checked every 5 years starting at age 33.  Hepatitis C test.  Hepatitis B test. Screening  Diabetes screening. This is done by checking your blood sugar (glucose) after you have not eaten for a while (fasting).  Sexually transmitted disease (STD) testing.  BRCA-related cancer screening. This may be done if you have a family history of breast, ovarian, tubal, or peritoneal cancers.  Pelvic exam and Pap test. This may be done every 3 years starting at age 76. Starting at age 102, this may be done every 5 years if you have a Pap test in combination with an HPV test. Talk with your health care provider about your test results, treatment options, and if necessary, the need for more tests.  Follow these instructions at home: Eating and drinking   Eat a diet that includes fresh fruits and vegetables, whole grains, lean protein, and low-fat dairy.  Take vitamin and mineral supplements as recommended by your health care provider.  Do not drink alcohol if: ? Your health care provider tells you not to drink. ? You are  pregnant, may be pregnant, or are planning to become pregnant.  If you drink alcohol: ? Limit how much you have to 0-1 drink a day. ? Be aware of how much alcohol is in your drink. In the [place] one drink equals one 12 oz bottle of beer (355 mL), one 5 oz glass of wine (148 mL), or one 1 oz glass of hard liquor (44 mL). Lifestyle  Take daily care of your teeth and gums.  Stay active. Exercise for at least 30 minutes on 5 or more days each week.  Do not use any products that contain nicotine or tobacco, such as cigarettes, e-cigarettes, and chewing tobacco. If you need help quitting, ask your health care provider.  If you are sexually active, practice safe sex. Use a condom or other form of birth control (contraception) in order to prevent pregnancy and STIs (sexually transmitted infections). If you plan to become pregnant, see your health care provider for a preconception visit. What's next?  Visit your health care provider once a year for a well check visit.  Ask your health care provider how often you should have your eyes and teeth checked.  Stay up to date on all vaccines. This information is not intended to replace advice given to you by your health care provider. Make sure you discuss any questions you have with your health care provider. Document Revised: 03/11/2018 Document Reviewed: 03/11/2018 Elsevier Patient Education  2020 Elsevier Inc.        Breast Self-Awareness Breast self-awareness means being familiar with how your breasts look and feel. It involves checking your breasts regularly and reporting any changes to your health care provider. Practicing breast self-awareness is important. Sometimes changes may not be harmful (are benign), but sometimes a change in your breasts can be a sign of a serious medical problem. It is important to learn how to do this procedure correctly so that you can catch problems early, when treatment is more likely to be successful. All  women should practice breast self-awareness, including women who have had breast implants. What you need:  A mirror.  A well-lit room. How to do a breast self-exam A breast self-exam is one way to learn what is normal for your breasts and whether your breasts are changing. To do a breast self-exam: Look for changes  1. Remove all the clothing above your waist. 2. Stand in front of a mirror in a room with good lighting. 3. Put your hands on your hips. 4. Push your hands firmly downward. 5. Compare your breasts in the mirror. Look for differences between them (asymmetry), such as: ? Differences in shape. ? Differences in size. ? Puckers, dips, and bumps in one breast and not the other. 6. Look at each breast for changes in the skin, such as: ? Redness. ? Scaly areas. 7. Look for changes in your nipples, such as: ? Discharge. ? Bleeding. ? Dimpling. ? Redness. ? A change in position. Feel for changes Carefully feel your breasts for lumps and changes. It is best to do this while lying on your back on the floor, and again while sitting or standing in the tub  or shower with soapy water on your skin. Feel each breast in the following way: 1. Place the arm on the side of the breast you are examining above your head. 2. Feel your breast with the other hand. 3. Start in the nipple area and make -inch (2 cm) overlapping circles to feel your breast. Use the pads of your three middle fingers to do this. Apply light pressure, then medium pressure, then firm pressure. The light pressure will allow you to feel the tissue closest to the skin. The medium pressure will allow you to feel the tissue that is a little deeper. The firm pressure will allow you to feel the tissue close to the ribs. 4. Continue the overlapping circles, moving downward over the breast until you feel your ribs below your breast. 5. Move one finger-width toward the center of the body. Continue to use the -inch (2 cm)  overlapping circles to feel your breast as you move slowly up toward your collarbone. 6. Continue the up-and-down exam using all three pressures until you reach your armpit.  Write down what you find Writing down what you find can help you remember what to discuss with your health care provider. Write down:  What is normal for each breast.  Any changes that you find in each breast, including: ? The kind of changes you find. ? Any pain or tenderness. ? Size and location of any lumps.  Where you are in your menstrual cycle, if you are still menstruating. General tips and recommendations  Examine your breasts every month.  If you are breastfeeding, the best time to examine your breasts is after a feeding or after using a breast pump.  If you menstruate, the best time to examine your breasts is 5-7 days after your period. Breasts are generally lumpier during menstrual periods, and it may be more difficult to notice changes.  With time and practice, you will become more familiar with the variations in your breasts and more comfortable with the exam. Contact a health care provider if you:  See a change in the shape or size of your breasts or nipples.  See a change in the skin of your breast or nipples, such as a reddened or scaly area.  Have unusual discharge from your nipples.  Find a lump or thick area that was not there before.  Have pain in your breasts.  Have any concerns related to your breast health. Summary  Breast self-awareness includes looking for physical changes in your breasts, as well as feeling for any changes within your breasts.  Breast self-awareness should be performed in front of a mirror in a well-lit room.  You should examine your breasts every month. If you menstruate, the best time to examine your breasts is 5-7 days after your menstrual period.  Let your health care provider know of any changes you notice in your breasts, including changes in size,  changes on the skin, pain or tenderness, or unusual fluid from your nipples. This information is not intended to replace advice given to you by your health care provider. Make sure you discuss any questions you have with your health care provider. Document Revised: 02/16/2018 Document Reviewed: 02/16/2018 Elsevier Patient Education  [place] Sexually Transmitted Infections, Adult Sexually transmitted infections (STIs) are diseases that are passed (transmitted) from person to person through bodily fluids exchanged during sex or sexual contact. Bodily fluids include saliva, semen, blood, vaginal mucus, and urine.  You may have an increased risk for developing an STI if you have unprotected oral, vaginal, or anal sex. Some common STIs include:  Herpes.  Hepatitis B.  Chlamydia.  Gonorrhea.  Syphilis.  HPV (human papillomavirus).  HIV (human immunodeficiency virus), the virus that can cause AIDS (acquired immunodeficiency syndrome). How can I protect myself from sexually transmitted infections? The only way to completely prevent STIs is not to have sex of any kind (practice abstinence). This includes oral, vaginal, or anal sex. If you are sexually active, take these actions to lower your risk of getting an STI:  Have only one sex partner (be monogamous) or limit the number of sexual partners you have.  Stay up-to-date on immunizations. Certain vaccines can lower your risk of getting certain STIs, such as: ? Hepatitis A and B vaccines. You may have been vaccinated as a young child, but likely need a booster shot as a teen or young adult. ? HPV vaccine.  Use methods that prevent the exchange of body fluids between partners (barrier protection) every time you have sex. Barrier protection can be used during oral, vaginal, or anal sex. Commonly used barrier methods include: ? Female condom. ? Female condom. ? Dental dam.  Get tested regularly for STIs. Have  your sexual partner get tested regularly as well.  Avoid mixing alcohol, drugs, and sex. Alcohol and drug use can affect your ability to make good decisions and can lead to risky sexual behaviors.  Ask your health care provider about taking pre-exposure prophylaxis (PrEP) to prevent HIV infection if you: ? Have a HIV-positive sexual partner. ? Have multiple sexual partners or partners who do not know their HIV status, and do not regularly use a condom during sex. ? Use injection drugs and share needles. Birth control pills, injections, implants, and intrauterine devices (IUDs) do not protect against STIs. To prevent both STIs and pregnancy, always use a condom with another form of birth control. Some STIs, such as herpes, are spread through skin to skin contact. A condom does not protect you from getting such STIs. If you or your partner have herpes and there is an active flare with open sores, avoid all sexual contact. Why are these changes important? Taking steps to practice safe sex protects you and others. Many STIs can be cured. However, some STIs are not curable and will affect you for the rest of your life. STIs can be passed on to another person even if you do not have symptoms. What can happen if changes are not made? Certain STIs may:  Require you to take medicine for the rest of your life.  Affect your ability to have children (your fertility).  Increase your risk for developing another STI or certain serious health conditions, such as: ? Cervical cancer. ? Head and neck cancer. ? Pelvic inflammatory disease (PID) in women. ? Organ damage or damage to other parts of your body, if the infection spreads.  Be passed to a baby during childbirth. How are sexually transmitted infections treated? If you or your partner know or think that you may have an STI:  Talk with your health care provider about what can be done to treat it. Some STIs can be treated and cured with  medicines.  For curable STIs, you and your partner should avoid sex during treatment and for several days after treatment is complete.  You and your partner should both be treated at the same time, if there is any chance that your partner is  infected as well. If you get treatment but your partner does not, your partner can re-infect you when you resume sexual contact.  Do not have unprotected sex. Where to find more information Learn more about sexually transmitted diseases and infections from:  Centers for Disease Control and Prevention: ? More information about specific STIs: AppraiserFraud.fi ? Find places to get sexual health counseling and treatment for free or for a low cost: gettested.StoreMirror.com.cy  [place] Department of Health and Human Services: http://white.info/.html Summary  The only way to completely prevent STIs is not to have sex (practice abstinence), including oral, vaginal, or anal sex.  STIs can spread through saliva, semen, blood, vaginal mucus, urine, or sexual contact.  If you do have sex, limit your number of sexual partners and use a barrier protection method every time you have sex.  If you develop an STI, get treated right away and ask your partner to be treated as well. Do not resume having sex until both of you have completed treatment for the STI. This information is not intended to replace advice given to you by your health care provider. Make sure you discuss any questions you have with your health care provider. Document Revised: 11/23/2018 Document Reviewed: 06/26/2016 Elsevier Patient Education  [place].        Constipation, Adult Constipation is when a person has fewer bowel movements in a week than normal, has difficulty having a bowel movement, or has stools that are dry, hard, or larger than normal. Constipation may be caused by an underlying condition. It may become  worse with age if a person takes certain medicines and does not take in enough fluids. Follow these instructions at home: Eating and drinking   Eat foods that have a lot of fiber, such as fresh fruits and vegetables, whole grains, and beans.  Limit foods that are high in fat, low in fiber, or overly processed, such as french fries, hamburgers, cookies, candies, and soda.  Drink enough fluid to keep your urine clear or pale yellow. General instructions  Exercise regularly or as told by your health care provider.  Go to the restroom when you have the urge to go. Do not hold it in.  Take over-the-counter and prescription medicines only as told by your health care provider. These include any fiber supplements.  Practice pelvic floor retraining exercises, such as deep breathing while relaxing the lower abdomen and pelvic floor relaxation during bowel movements.  Watch your condition for any changes.  Keep all follow-up visits as told by your health care provider. This is important. Contact a health care provider if:  You have pain that gets worse.  You have a fever.  You do not have a bowel movement after 4 days.  You vomit.  You are not hungry.  You lose weight.  You are bleeding from the anus.  You have thin, pencil-like stools. Get help right away if:  You have a fever and your symptoms suddenly get worse.  You leak stool or have blood in your stool.  Your abdomen is bloated.  You have severe pain in your abdomen.  You feel dizzy or you faint. This information is not intended to replace advice given to you by your health care provider. Make sure you discuss any questions you have with your health care provider. Document Revised: 06/12/2017 Document Reviewed: 12/19/2015 Elsevier Patient Education  [place].        Hemorrhoids Hemorrhoids are swollen veins in and around the  rectum or anus. There are two types of hemorrhoids:  Internal hemorrhoids.  These occur in the veins that are just inside the rectum. They may poke through to the outside and become irritated and painful.  External hemorrhoids. These occur in the veins that are outside the anus and can be felt as a painful swelling or hard lump near the anus. Most hemorrhoids do not cause serious problems, and they can be managed with home treatments such as diet and lifestyle changes. If home treatments do not help the symptoms, procedures can be done to shrink or remove the hemorrhoids. What are the causes? This condition is caused by increased pressure in the anal area. This pressure may result from various things, including:  Constipation.  Straining to have a bowel movement.  Diarrhea.  Pregnancy.  Obesity.  Sitting for long periods of time.  Heavy lifting or other activity that causes you to strain.  Anal sex.  Riding a bike for a long period of time. What are the signs or symptoms? Symptoms of this condition include:  Pain.  Anal itching or irritation.  Rectal bleeding.  Leakage of stool (feces).  Anal swelling.  One or more lumps around the anus. How is this diagnosed? This condition can often be diagnosed through a visual exam. Other exams or tests may also be done, such as:  An exam that involves feeling the rectal area with a gloved hand (digital rectal exam).  An exam of the anal canal that is done using a small tube (anoscope).  A blood test, if you have lost a significant amount of blood.  A test to look inside the colon using a flexible tube with a camera on the end (sigmoidoscopy or colonoscopy). How is this treated? This condition can usually be treated at home. However, various procedures may be done if dietary changes, lifestyle changes, and other home treatments do not help your symptoms. These procedures can help make the hemorrhoids smaller or remove them completely. Some of these procedures involve surgery, and others do not. Common  procedures include:  Rubber band ligation. Rubber bands are placed at the base of the hemorrhoids to cut off their blood supply.  Sclerotherapy. Medicine is injected into the hemorrhoids to shrink them.  Infrared coagulation. A type of light energy is used to get rid of the hemorrhoids.  Hemorrhoidectomy surgery. The hemorrhoids are surgically removed, and the veins that supply them are tied off.  Stapled hemorrhoidopexy surgery. The surgeon staples the base of the hemorrhoid to the rectal wall. Follow these instructions at home: Eating and drinking   Eat foods that have a lot of fiber in them, such as whole grains, beans, nuts, fruits, and vegetables.  Ask your health care provider about taking products that have added fiber (fiber supplements).  Reduce the amount of fat in your diet. You can do this by eating low-fat dairy products, eating less red meat, and avoiding processed foods.  Drink enough fluid to keep your urine pale yellow. Managing pain and swelling   Take warm sitz baths for 20 minutes, 3-4 times a day to ease pain and discomfort. You may do this in a bathtub or using a portable sitz bath that fits over the toilet.  If directed, apply ice to the affected area. Using ice packs between sitz baths may be helpful. ? Put ice in a plastic bag. ? Place a towel between your skin and the bag. ? Leave the ice on for 20 minutes, 2-3 times  a day. General instructions  Take over-the-counter and prescription medicines only as told by your health care provider.  Use medicated creams or suppositories as told.  Get regular exercise. Ask your health care provider how much and what kind of exercise is best for you. In general, you should do moderate exercise for at least 30 minutes on most days of the week (150 minutes each week). This can include activities such as walking, biking, or yoga.  Go to the bathroom when you have the urge to have a bowel movement. Do not wait.  Avoid  straining to have bowel movements.  Keep the anal area dry and clean. Use wet toilet paper or moist towelettes after a bowel movement.  Do not sit on the toilet for long periods of time. This increases blood pooling and pain.  Keep all follow-up visits as told by your health care provider. This is important. Contact a health care provider if you have:  Increasing pain and swelling that are not controlled by treatment or medicine.  Difficulty having a bowel movement, or you are unable to have a bowel movement.  Pain or inflammation outside the area of the hemorrhoids. Get help right away if you have:  Uncontrolled bleeding from your rectum. Summary  Hemorrhoids are swollen veins in and around the rectum or anus.  Most hemorrhoids can be managed with home treatments such as diet and lifestyle changes.  Taking warm sitz baths can help ease pain and discomfort.  In severe cases, procedures or surgery can be done to shrink or remove the hemorrhoids. This information is not intended to replace advice given to you by your health care provider. Make sure you discuss any questions you have with your health care provider. Document Revised: 11/26/2018 Document Reviewed: 11/19/2017 Elsevier Patient Education  [place].

## 2020-04-04 NOTE — Progress Notes (Signed)
Last pap 04/2019-normal

## 2020-04-04 NOTE — Progress Notes (Signed)
GYNECOLOGY ANNUAL PREVENTATIVE CARE ENCOUNTER NOTE  History:     Amy Davies is a 34 y.o. [phone] female here for a routine annual gynecologic exam.  Current complaints: none.   Denies abnormal vaginal bleeding, discharge, pelvic pain, problems with intercourse or other gynecologic concerns. Pt requests STD testing today. Pt reports she does perform SBE. Pt denies bowel or bladder concerns, but reports occasional constipation and hemorrhoids. No family hx of breast, endometrial or ovarian cancer. Paternal great-grandmother passed away from colon cancer in 58s.       Gynecologic History No LMP recorded. Patient has had an injection. Menstruation: none, on Depo Contraception: Depo-Provera injections. Pt reports she does not desire pregnancy in the next year. Last Pap: 04/2019. Results were: normal per EPIC. Pt denies history of abnormal Pap.  Obstetric History OB History  Gravida Para Term Preterm AB Living  6 3 2 1 3 3   SAB TAB Ectopic Multiple Live Births  1 2 0 0 3    # Outcome Date GA Lbr Len/2nd Weight Sex Delivery Anes PTL Lv  6 Preterm 09/06/15 [redacted]w[redacted]d  4 lb 12.5 oz (2.17 kg) F CS-LTranv Spinal  LIV  5 Term 07/17/10 [redacted]w[redacted]d  8 lb (3.629 kg) M Vag-Spont EPI N LIV  4 SAB 2009 [redacted]w[redacted]d    SAB     3 Term 04/12/03 [redacted]w[redacted]d  7 lb 9 oz (3.43 kg) M Vag-Spont EPI N LIV  2 TAB           1 TAB             Past Medical History:  Diagnosis Date  . Irritable bowel syndrome     Past Surgical History:  Procedure Laterality Date  . CESAREAN SECTION N/A 09/06/2015   Procedure: CESAREAN SECTION;  Surgeon: 09/08/2015, MD;  Location: [facility];  Service: Obstetrics;  Laterality: N/A;  . INDUCED ABORTION    . NO PAST SURGERIES      Current Outpatient Medications on File Prior to Visit  Medication Sig Dispense Refill  . medroxyPROGESTERone (DEPO-PROVERA) 150 MG/ML injection Inject 1 mL (150 mg total) into the muscle every 3 (three) months. 1 mL 4  . amoxicillin-clavulanate (AUGMENTIN)  875-125 MG tablet Take 1 tablet by mouth every 12 (twelve) hours. (Patient not taking: Reported on 01/18/2020) 14 tablet 0  . azelastine (ASTELIN) 0.1 % nasal spray Place 2 sprays into both nostrils 2 (two) times daily. (Patient not taking: Reported on 01/18/2020) 30 mL 0   Current Facility-Administered Medications on File Prior to Visit  Medication Dose Route Frequency Provider Last Rate Last Admin  . medroxyPROGESTERone (DEPO-PROVERA) injection 150 mg  150 mg Intramuscular Q90 days Denney, Rachelle A, CNM   150 mg at 05/12/19 1005    No Known Allergies  Social History:  reports that she has quit smoking. Her smoking use included cigarettes. She has never used smokeless tobacco. She reports current alcohol use. She reports that she does not use drugs.  Family History  Problem Relation Age of Onset  . Asthma Neg Hx   . Diabetes Neg Hx   . Heart disease Neg Hx   . Hypertension Neg Hx   . Kidney disease Neg Hx   . Stroke Neg Hx   . Hearing loss Neg Hx     The following portions of the patient's history were reviewed and updated as appropriate: allergies, current medications, past family history, past medical history, past social history, past surgical history and problem list.  Review of Systems Pertinent items noted in HPI and remainder of comprehensive ROS otherwise negative.  Physical Exam:  BP 123/88   Pulse 88   Wt 172 lb 3.2 oz (78.1 kg)   BMI 26.97 kg/m  CONSTITUTIONAL: Well-developed, well-nourished female in no acute distress.  HENT:  Normocephalic, atraumatic, External right and left ear normal. EYES: Conjunctivae and EOM are normal. Pupils are equal, round, and reactive to light. No scleral icterus.  NECK: Normal range of motion, supple, no masses.  Normal thyroid.  SKIN: Skin is warm and dry. No rash noted. Not diaphoretic. No erythema. No pallor. MUSCULOSKELETAL: Normal range of motion. No tenderness.  No cyanosis, clubbing, or edema. NEUROLOGIC: Alert and oriented to  person, place, and time. Normal reflexes, muscle tone coordination. PSYCHIATRIC: Normal mood and affect. Normal behavior. Normal judgment and thought content. CARDIOVASCULAR: Normal heart rate noted, regular rhythm. RESPIRATORY: Clear to auscultation bilaterally. Effort and breath sounds normal, no problems with respiration noted. BREASTS: Symmetric in size. No masses, skin changes, nipple drainage, or lymphadenopathy. ABDOMEN: Soft, normal bowel sounds, no distention noted.  No tenderness, rebound or guarding.  PELVIC: not evaluated. Pt not due for Pap and not experiencing any pelvic concerns today.   Assessment and Plan:    1. Screening examination for sexually transmitted disease - HIV antibody (with reflex) - RPR - Hepatitis B Surface AntiGEN - Hepatitis C Antibody - Cervicovaginal ancillary only( [place])  2. Well woman exam (no gynecological exam) - return for Depo as planned  Will follow up results of pap smear and other testing, if performed, and manage accordingly. Routine preventative health maintenance measures emphasized. Self-breast awareness taught, importance discussed, advised when to RTC, SBA literature given. Please refer to After Visit Summary for other counseling recommendations.      Gearldine Shown, [facility] Nurse Practitioner, [facility] for [employer], [facility]

## 2020-04-05 LAB — CERVICOVAGINAL ANCILLARY ONLY
Bacterial Vaginitis (gardnerella): NEGATIVE
Candida Glabrata: NEGATIVE
Candida Vaginitis: POSITIVE — AB
Chlamydia: NEGATIVE
Comment: NEGATIVE
Comment: NEGATIVE
Comment: NEGATIVE
Comment: NEGATIVE
Comment: NEGATIVE
Comment: NORMAL
Neisseria Gonorrhea: NEGATIVE
Trichomonas: NEGATIVE

## 2020-04-05 LAB — HEPATITIS B SURFACE ANTIGEN: Hepatitis B Surface Ag: NEGATIVE

## 2020-04-05 LAB — RPR: RPR Ser Ql: NONREACTIVE

## 2020-04-05 LAB — HEPATITIS C ANTIBODY: Hep C Virus Ab: <0.1 s/co ratio (ref 0.0–0.9)

## 2020-04-05 LAB — HIV ANTIBODY (ROUTINE TESTING W REFLEX): HIV Screen 4th Generation wRfx: NONREACTIVE

## 2020-04-11 ENCOUNTER — Ambulatory Visit (INDEPENDENT_AMBULATORY_CARE_PROVIDER_SITE_OTHER): Payer: 59

## 2020-04-11 ENCOUNTER — Other Ambulatory Visit: Payer: Self-pay

## 2020-04-11 VITALS — BP 119/76 | HR 80 | Wt 170.0 lb

## 2020-04-11 DIAGNOSIS — Z3042 Encounter for surveillance of injectable contraceptive: Secondary | ICD-10-CM | POA: Diagnosis not present

## 2020-04-11 MED ORDER — MEDROXYPROGESTERONE ACETATE 150 MG/ML IM SUSP
150.0000 mg | Freq: Once | INTRAMUSCULAR | Status: AC
  Administered 2020-04-11: 150 mg via INTRAMUSCULAR

## 2020-04-11 NOTE — Progress Notes (Signed)
Patient was assessed and managed by nursing staff during this encounter. I have reviewed the chart and agree with the documentation and plan. I have also made any necessary editorial changes.  Meia Emley A Homer Miller, MD 04/11/2020 11:31 AM   

## 2020-04-11 NOTE — Progress Notes (Addendum)
GYN presents for DEPO Injection, given in RD, tolerated well.  Last PAP 05/12/2019  Next DEPO Dec. 15-29, 2021  Administrations This Visit    medroxyPROGESTERone (DEPO-PROVERA) injection 150 mg    Admin Date 04/11/2020 Action Given Dose 150 mg Route Intramuscular Administered By Maretta Bees, RMA

## 2020-04-24 ENCOUNTER — Other Ambulatory Visit: Payer: Self-pay

## 2020-04-24 MED ORDER — FLUCONAZOLE 150 MG PO TABS: 150.0000 mg | ORAL_TABLET | Freq: Once | ORAL | 0 refills | Status: DC

## 2020-04-24 NOTE — Progress Notes (Signed)
Diflucan rx sent to patient's pharmacy per protocol for yeast infection symptoms, pt made aware.

## 2020-05-07 ENCOUNTER — Other Ambulatory Visit: Payer: Self-pay | Admitting: *Deleted

## 2020-05-07 ENCOUNTER — Telehealth: Payer: Self-pay | Admitting: *Deleted

## 2020-05-07 MED ORDER — FLUCONAZOLE 150 MG PO TABS: 150.0000 mg | ORAL_TABLET | Freq: Once | ORAL | 0 refills | Status: AC

## 2020-05-07 NOTE — Telephone Encounter (Signed)
Pt called to office for Diflucan Rx. Rx was just sent to pharmacy on 04/24/20. ? Did pt pick up Rx.   Attempt to contact pt. No answer, VM full.

## 2020-05-21 ENCOUNTER — Ambulatory Visit (INDEPENDENT_AMBULATORY_CARE_PROVIDER_SITE_OTHER): Payer: 59

## 2020-05-21 ENCOUNTER — Other Ambulatory Visit: Payer: Self-pay | Admitting: Obstetrics

## 2020-05-21 ENCOUNTER — Other Ambulatory Visit: Payer: Self-pay

## 2020-05-21 VITALS — BP 123/81 | HR 74 | Wt 171.0 lb

## 2020-05-21 DIAGNOSIS — R319 Hematuria, unspecified: Secondary | ICD-10-CM

## 2020-05-21 DIAGNOSIS — N390 Urinary tract infection, site not specified: Secondary | ICD-10-CM

## 2020-05-21 DIAGNOSIS — B9620 Unspecified Escherichia coli [E. coli] as the cause of diseases classified elsewhere: Secondary | ICD-10-CM

## 2020-05-21 LAB — POCT URINALYSIS DIPSTICK
Blood, UA: POSITIVE
Glucose, UA: NEGATIVE
Nitrite, UA: POSITIVE
Protein, UA: POSITIVE — AB
Spec Grav, UA: 1.025 (ref 1.010–1.025)
Urobilinogen, UA: 0.2 E.U./dL
pH, UA: 5 (ref 5.0–8.0)

## 2020-05-21 MED ORDER — CEPHALEXIN 500 MG PO CAPS: 500.0000 mg | ORAL_CAPSULE | Freq: Two times a day (BID) | ORAL | 0 refills | Status: DC

## 2020-05-21 MED ORDER — FLUCONAZOLE 150 MG PO TABS: 150.0000 mg | ORAL_TABLET | ORAL | 0 refills | Status: AC

## 2020-05-21 NOTE — Progress Notes (Signed)
SUBJECTIVE: Amy Davies is a 34 y.o. female who complains of urinary frequency, urgency, back pain 5/10, odor and dysuria x 7 days, without flank pain, fever, chills, or abnormal vaginal discharge or bleeding.   OBJECTIVE: Appears well, in no apparent distress.  Vital signs are normal. Urine dipstick shows positive for WBC's, positive for RBC's, positive for protein, positive for nitrates, positive for leukocytes and positive for ketones.    ASSESSMENT: Dysuria, UTI  PLAN: Treatment per orders.  Call or return to clinic prn if these symptoms worsen or fail to improve as anticipated per Dr. Clearance Coots.

## 2020-05-21 NOTE — Progress Notes (Signed)
I reviewed the nurses note and agree with the plan of care.  Ceftin Rx because of backache and possible ascending infection.  Brock Bad, MD 10/09/2017 10:46 AM

## 2020-05-24 ENCOUNTER — Other Ambulatory Visit: Payer: Self-pay | Admitting: Obstetrics

## 2020-05-24 LAB — URINE CULTURE

## 2020-05-28 ENCOUNTER — Telehealth: Payer: Self-pay

## 2020-05-28 NOTE — Telephone Encounter (Signed)
-----   Message from Brock Bad, MD sent at 05/24/2020  3:59 PM EST ----- Ceftin Rx instead of Keflex because of backache and possible ascending infection, and better tissue penetration and bacterial coverage with Ceftin.

## 2020-05-28 NOTE — Telephone Encounter (Signed)
Rx was already called in on 05/21/2020 and patient is aware.

## 2020-07-03 ENCOUNTER — Telehealth: Payer: Self-pay | Admitting: *Deleted

## 2020-07-03 ENCOUNTER — Other Ambulatory Visit: Payer: Self-pay | Admitting: *Deleted

## 2020-07-03 MED ORDER — FLUCONAZOLE 150 MG PO TABS: 150.0000 mg | ORAL_TABLET | Freq: Once | ORAL | 0 refills | Status: AC

## 2020-07-03 NOTE — Telephone Encounter (Signed)
Pt called to office for yeast treatment.  Attempt to call pt back.  No answer, LM on VM to call office.

## 2020-07-03 NOTE — Progress Notes (Signed)
Pt called to office with vaginal irritations and yeast like d/c. Pt would like treatment for yeast today.  Diflucan sent per protocol.  Pt advised if no relief before upcoming appt she may do self swab at that time.

## 2020-07-04 ENCOUNTER — Ambulatory Visit: Payer: 59

## 2020-07-05 ENCOUNTER — Ambulatory Visit (INDEPENDENT_AMBULATORY_CARE_PROVIDER_SITE_OTHER): Payer: 59

## 2020-07-05 ENCOUNTER — Other Ambulatory Visit: Payer: Self-pay

## 2020-07-05 DIAGNOSIS — Z3042 Encounter for surveillance of injectable contraceptive: Secondary | ICD-10-CM

## 2020-07-05 MED ORDER — MEDROXYPROGESTERONE ACETATE 150 MG/ML IM SUSP
150.0000 mg | Freq: Once | INTRAMUSCULAR | Status: AC
  Administered 2020-07-05: 150 mg via INTRAMUSCULAR

## 2020-07-05 NOTE — Progress Notes (Signed)
Patient presents for depo injection. Patient is within her window. Inj given in LD. Patient tolerated well. Next depo due 3/10-3/24.

## 2020-07-05 NOTE — Progress Notes (Signed)
Patient was assessed and managed by nursing staff during this encounter. I have reviewed the chart and agree with the documentation and plan. I have also made any necessary editorial changes.  Warden Fillers, MD 07/05/2020 10:41 AM

## 2020-09-21 ENCOUNTER — Other Ambulatory Visit: Payer: Self-pay | Admitting: Obstetrics and Gynecology

## 2020-09-21 DIAGNOSIS — Z3042 Encounter for surveillance of injectable contraceptive: Secondary | ICD-10-CM

## 2020-09-24 ENCOUNTER — Ambulatory Visit: Payer: 59

## 2020-09-24 ENCOUNTER — Encounter: Payer: Self-pay | Admitting: Emergency Medicine

## 2020-09-24 ENCOUNTER — Other Ambulatory Visit: Payer: Self-pay

## 2020-09-24 ENCOUNTER — Ambulatory Visit
Admission: EM | Admit: 2020-09-24 | Discharge: 2020-09-24 | Disposition: A | Discharge status: Home / Self Care | Payer: 59

## 2020-09-24 DIAGNOSIS — J069 Acute upper respiratory infection, unspecified: Secondary | ICD-10-CM

## 2020-09-24 MED ORDER — IBUPROFEN 800 MG PO TABS: 800.0000 mg | ORAL_TABLET | Freq: Three times a day (TID) | ORAL | 0 refills | Status: DC

## 2020-09-24 MED ORDER — BENZONATATE 100 MG PO CAPS: 100.0000 mg | ORAL_CAPSULE | Freq: Three times a day (TID) | ORAL | 0 refills | Status: DC

## 2020-09-24 MED ORDER — FLUTICASONE PROPIONATE 50 MCG/ACT NA SUSP: 1.0000 | Freq: Every day | NASAL | 0 refills | Status: DC

## 2020-09-24 MED ORDER — AMOXICILLIN-POT CLAVULANATE 875-125 MG PO TABS: 1.0000 | ORAL_TABLET | Freq: Two times a day (BID) | ORAL | 0 refills | Status: AC

## 2020-09-24 MED ORDER — FLUCONAZOLE 150 MG PO TABS: 150.0000 mg | ORAL_TABLET | Freq: Once | ORAL | 0 refills | Status: AC

## 2020-09-24 NOTE — ED Triage Notes (Signed)
Symptoms started 3 days ago.  Patient has headache, cough, runny nose, left ear pain and sinus pressure

## 2020-09-24 NOTE — Discharge Instructions (Addendum)
Continue daily Claritin Supplement with Flonase nasal spray 1 to 2 spray in each nostril daily; may continue Sudafed as needed Tessalon every 8 hours for cough Ibuprofen and Tylenol for pain/pressure If not seeing any improvement with the above over the next 3 to 4 days may fill Augmentin/Diflucan to cover sinus infection/ear infection

## 2020-09-24 NOTE — ED Provider Notes (Signed)
[name]    CSN: [id number] Arrival date & time: 09/24/20  1021      History   Chief Complaint Chief Complaint  Patient presents with  . URI    HPI Amy Davies is a 35 y.o. female presenting today for evaluation of URI symptoms.  Reports headache cough congestion and ear pain.  Symptoms began 3 days ago.  Reports a lot of pressure and ear pain on left side.  Denies any known fevers.  Denies close sick contacts.  Denies history of prior Covid infection.  Using daily Claritin and Sudafed without full relief.  HPI  Past Medical History:  Diagnosis Date  . Irritable bowel syndrome     There are no problems to display for this patient.   Past Surgical History:  Procedure Laterality Date  . CESAREAN SECTION N/A 09/06/2015   Procedure: CESAREAN SECTION;  Surgeon: Brock Bad, MD;  Location: [facility];  Service: Obstetrics;  Laterality: N/A;  . INDUCED ABORTION    . NO PAST SURGERIES      OB History    Gravida  6   Para  3   Term  2   Preterm  1   AB  3   Living  3     SAB  1   IAB  2   Ectopic  0   Multiple  0   Live Births  3            Home Medications    Prior to Admission medications   Medication Sig Start Date End Date Taking? Authorizing Provider  amoxicillin-clavulanate (AUGMENTIN) 875-125 MG tablet Take 1 tablet by mouth every 12 (twelve) hours for 10 days. 09/24/20 10/04/20 Yes Emersyn Wyss C, PA-C  benzonatate (TESSALON) 100 MG capsule Take 1-2 capsules (100-200 mg total) by mouth every 8 (eight) hours. 09/24/20  Yes Vennie Salsbury C, PA-C  fluconazole (DIFLUCAN) 150 MG tablet Take 1 tablet (150 mg total) by mouth once for 1 dose. 09/24/20 09/24/20 Yes Anjeanette Petzold C, PA-C  fluticasone (FLONASE) 50 MCG/ACT nasal spray Place 1-2 sprays into both nostrils daily. 09/24/20  Yes Cathy Ropp C, PA-C  ibuprofen (ADVIL) 800 MG tablet Take 1 tablet (800 mg total) by mouth 3 (three) times daily. 09/24/20  Yes Seini Lannom  C, PA-C  NON FORMULARY Otc sinus medicine   Yes [provider]  medroxyPROGESTERone (DEPO-PROVERA) 150 MG/ML injection Inject 1 mL (150 mg total) into the muscle every 3 (three) months. 08/03/19   Constant, Peggy, MD  azelastine (ASTELIN) 0.1 % nasal spray Place 2 sprays into both nostrils 2 (two) times daily. Patient not taking: No sig reported 08/30/19 09/24/20  Belinda Fisher, PA-C    Family History Family History  Problem Relation Age of Onset  . Asthma Neg Hx   . Diabetes Neg Hx   . Heart disease Neg Hx   . Hypertension Neg Hx   . Kidney disease Neg Hx   . Stroke Neg Hx   . Hearing loss Neg Hx     Social History Social History   Tobacco Use  . Smoking status: Former Smoker    Types: Cigarettes  . Smokeless tobacco: Never Used  . Tobacco comment: July 2016  Vaping Use  . Vaping Use: Never used  Substance Use Topics  . Alcohol use: Yes    Alcohol/week: 0.0 standard drinks    Comment: socially; not since +preg  . Drug use: No    Types: Marijuana  Comment: last was Aug 07/2014     Allergies   Patient has no known allergies.   Review of Systems Review of Systems  Constitutional: Negative for activity change, appetite change, chills, fatigue and fever.  HENT: Positive for congestion, rhinorrhea, sinus pressure and sore throat. Negative for ear pain and trouble swallowing.   Eyes: Negative for discharge and redness.  Respiratory: Positive for cough. Negative for chest tightness and shortness of breath.   Cardiovascular: Negative for chest pain.  Gastrointestinal: Negative for abdominal pain, diarrhea, nausea and vomiting.  Musculoskeletal: Negative for myalgias.  Skin: Negative for rash.  Neurological: Negative for dizziness, light-headedness and headaches.     Physical Exam Triage Vital Signs ED Triage Vitals [09/24/20 1218]  Enc Vitals Group     BP      Pulse      Resp      Temp      Temp src      SpO2      Weight      Height      Head  Circumference      Peak Flow      Pain Score 9     Pain Loc      Pain Edu?      Excl. in GC?    No data found.  Updated Vital Signs BP 119/79 (BP Location: Left Arm)   Pulse 100   Temp 98.6 F (37 C) (Oral)   Resp 18   SpO2 99%   Visual Acuity Right Eye Distance:   Left Eye Distance:   Bilateral Distance:    Right Eye Near:   Left Eye Near:    Bilateral Near:     Physical Exam Vitals and nursing note reviewed.  Constitutional:      Appearance: She is well-developed.     Comments: No acute distress  HENT:     Head: Normocephalic and atraumatic.     Ears:     Comments: Bilateral ears without tenderness to palpation of external auricle, tragus and mastoid, EAC's without erythema or swelling, TM's with good bony landmarks and cone of light. Non erythematous.  Left canal appears erythematous and TM injected, but good cone of light and does not appear dull or bulging.    Nose: Nose normal.     Mouth/Throat:     Comments: Oral mucosa pink and moist, no tonsillar enlargement or exudate. Posterior pharynx patent and nonerythematous, no uvula deviation or swelling. Normal phonation. Eyes:     Conjunctiva/sclera: Conjunctivae normal.  Cardiovascular:     Rate and Rhythm: Normal rate and regular rhythm.  Pulmonary:     Effort: Pulmonary effort is normal. No respiratory distress.     Comments: Breathing comfortably at rest, CTABL, no wheezing, rales or other adventitious sounds auscultated Abdominal:     General: There is no distension.  Musculoskeletal:        General: Normal range of motion.     Cervical back: Neck supple.  Skin:    General: Skin is warm and dry.  Neurological:     Mental Status: She is alert and oriented to person, place, and time.      UC Treatments / Results  Labs (all labs ordered are listed, but only abnormal results are displayed) Labs Reviewed  NOVEL CORONAVIRUS, NAA    EKG   Radiology No results found.  Procedures Procedures  (including critical care time)  Medications Ordered in UC Medications - No data to display  Initial Impression /  Assessment and Plan / UC Course  I have reviewed the triage vital signs and the nursing notes.  Pertinent labs & imaging results that were available during my care of the patient were reviewed by me and considered in my medical decision making (see chart for details).     Viral URI with cough-Covid test pending, monitor MyChart for results, recommended continued symptomatic and supportive care given symptoms x3 days, this time ear does not appear infected, but did opt to provide prescription for Augmentin to cover sinusitis/otitis in the setting of symptoms persisting an additional 3 to 4 days without improvement given recommendations today.  Discussed strict return precautions. Patient verbalized understanding and is agreeable with plan.  Final Clinical Impressions(s) / UC Diagnoses   Final diagnoses:  Viral URI with cough     Discharge Instructions     Continue daily Claritin Supplement with Flonase nasal spray 1 to 2 spray in each nostril daily; may continue Sudafed as needed Tessalon every 8 hours for cough Ibuprofen and Tylenol for pain/pressure If not seeing any improvement with the above over the next 3 to 4 days may fill Augmentin/Diflucan to cover sinus infection/ear infection    ED Prescriptions    Medication Sig Dispense Auth. Provider   amoxicillin-clavulanate (AUGMENTIN) 875-125 MG tablet Take 1 tablet by mouth every 12 (twelve) hours for 10 days. 20 tablet Lind Ausley C, PA-C   fluconazole (DIFLUCAN) 150 MG tablet Take 1 tablet (150 mg total) by mouth once for 1 dose. 2 tablet Kacper Cartlidge C, PA-C   fluticasone (FLONASE) 50 MCG/ACT nasal spray Place 1-2 sprays into both nostrils daily. 16 g Aviva Wolfer C, PA-C   benzonatate (TESSALON) 100 MG capsule Take 1-2 capsules (100-200 mg total) by mouth every 8 (eight) hours. 21 capsule Raahil Ong  C, PA-C   ibuprofen (ADVIL) 800 MG tablet Take 1 tablet (800 mg total) by mouth 3 (three) times daily. 21 tablet Khloee Garza, [place] C, PA-C     PDMP not reviewed this encounter.   Lew Dawes, PA-C 09/24/20 1314

## 2020-09-25 LAB — NOVEL CORONAVIRUS, NAA: SARS-CoV-2, NAA: NOT DETECTED

## 2020-09-25 LAB — SARS-COV-2, NAA 2 DAY TAT

## 2020-09-26 ENCOUNTER — Other Ambulatory Visit: Payer: Self-pay

## 2020-09-26 NOTE — Telephone Encounter (Signed)
Patient called still awaiting rx for Depo Depo inj appt scheduled on Friday Depo rx sent to pt's pharmacy

## 2020-09-28 ENCOUNTER — Other Ambulatory Visit: Payer: Self-pay

## 2020-09-28 ENCOUNTER — Ambulatory Visit (INDEPENDENT_AMBULATORY_CARE_PROVIDER_SITE_OTHER): Payer: 59

## 2020-09-28 DIAGNOSIS — Z3042 Encounter for surveillance of injectable contraceptive: Secondary | ICD-10-CM

## 2020-09-28 DIAGNOSIS — R8290 Unspecified abnormal findings in urine: Secondary | ICD-10-CM | POA: Diagnosis not present

## 2020-09-28 LAB — POCT URINALYSIS DIPSTICK
Bilirubin, UA: NEGATIVE
Glucose, UA: NEGATIVE
Ketones, UA: NEGATIVE
Leukocytes, UA: NEGATIVE
Nitrite, UA: NEGATIVE
Protein, UA: NEGATIVE
Spec Grav, UA: 1.01 (ref 1.010–1.025)
Urobilinogen, UA: 0.2 E.U./dL
pH, UA: 7 (ref 5.0–8.0)

## 2020-09-28 NOTE — Progress Notes (Signed)
Pt is in the office for depo injection, administered in R Del and pt tolerated well. Next depo due June 3-17. .. Administrations This Visit    medroxyPROGESTERone (DEPO-PROVERA) injection 150 mg    Admin Date 09/28/2020 Action Given Dose 150 mg Route Intramuscular Administered By Natale Milch D, RN          Pt reports odor with urination and cloudy urine, would like to be tested for UTI, left urine sample.

## 2020-10-03 LAB — URINE CULTURE

## 2020-10-05 ENCOUNTER — Other Ambulatory Visit: Payer: Self-pay | Admitting: Obstetrics & Gynecology

## 2020-10-05 DIAGNOSIS — R8290 Unspecified abnormal findings in urine: Secondary | ICD-10-CM

## 2020-10-05 MED ORDER — SULFAMETHOXAZOLE-TRIMETHOPRIM 800-160 MG PO TABS: 1.0000 | ORAL_TABLET | Freq: Two times a day (BID) | ORAL | 0 refills | Status: DC

## 2020-10-05 NOTE — Progress Notes (Signed)
Meds ordered this encounter  Medications  . sulfamethoxazole-trimethoprim (BACTRIM DS) 800-160 MG tablet    Sig: Take 1 tablet by mouth 2 (two) times daily.    Dispense:  6 tablet    Refill:  0

## 2020-12-21 ENCOUNTER — Ambulatory Visit (INDEPENDENT_AMBULATORY_CARE_PROVIDER_SITE_OTHER): Payer: 59

## 2020-12-21 ENCOUNTER — Other Ambulatory Visit: Payer: Self-pay

## 2020-12-21 DIAGNOSIS — Z3042 Encounter for surveillance of injectable contraceptive: Secondary | ICD-10-CM | POA: Diagnosis not present

## 2020-12-21 MED ORDER — MEDROXYPROGESTERONE ACETATE 150 MG/ML IM SUSP
150.0000 mg | INTRAMUSCULAR | Status: DC
  Administered 2020-12-21: 150 mg via INTRAMUSCULAR

## 2020-12-21 NOTE — Progress Notes (Signed)
GYN patient presents for Depo Injection.  Last Depo:09/28/20 RD  Depo given w/o any problems in LD patient tolerated injection well. Pt advised to make next appt at check out.

## 2021-03-12 ENCOUNTER — Other Ambulatory Visit: Payer: Self-pay

## 2021-03-12 ENCOUNTER — Ambulatory Visit (INDEPENDENT_AMBULATORY_CARE_PROVIDER_SITE_OTHER): Payer: 59

## 2021-03-12 VITALS — BP 125/71 | HR 84

## 2021-03-12 DIAGNOSIS — Z3042 Encounter for surveillance of injectable contraceptive: Secondary | ICD-10-CM

## 2021-03-12 MED ORDER — MEDROXYPROGESTERONE ACETATE 150 MG/ML IM SUSP
150.0000 mg | Freq: Once | INTRAMUSCULAR | Status: AC
  Administered 2021-03-12: 150 mg via INTRAMUSCULAR

## 2021-03-12 NOTE — Progress Notes (Signed)
Patient presented to the office today for her depo-provera injection.  Given by D.Hendrix Console in LT arm Side Effects: None  Last Depo: 09/28/2020 HCG Serum: N/A  NDC# 59093-112-16

## 2021-06-03 ENCOUNTER — Other Ambulatory Visit: Payer: Self-pay | Admitting: Obstetrics & Gynecology

## 2021-06-03 ENCOUNTER — Ambulatory Visit: Payer: 59

## 2021-06-03 DIAGNOSIS — Z3042 Encounter for surveillance of injectable contraceptive: Secondary | ICD-10-CM

## 2021-06-04 ENCOUNTER — Ambulatory Visit (INDEPENDENT_AMBULATORY_CARE_PROVIDER_SITE_OTHER): Payer: 59 | Admitting: *Deleted

## 2021-06-04 ENCOUNTER — Other Ambulatory Visit: Payer: Self-pay

## 2021-06-04 ENCOUNTER — Other Ambulatory Visit: Payer: Self-pay | Admitting: *Deleted

## 2021-06-04 VITALS — BP 118/82 | HR 82

## 2021-06-04 DIAGNOSIS — Z3042 Encounter for surveillance of injectable contraceptive: Secondary | ICD-10-CM | POA: Diagnosis not present

## 2021-06-04 DIAGNOSIS — R300 Dysuria: Secondary | ICD-10-CM

## 2021-06-04 LAB — POCT URINALYSIS DIPSTICK
Bilirubin, UA: NEGATIVE
Glucose, UA: NEGATIVE
Ketones, UA: NEGATIVE
Leukocytes, UA: NEGATIVE
Nitrite, UA: NEGATIVE
Protein, UA: NEGATIVE
Spec Grav, UA: 1.015 (ref 1.010–1.025)
Urobilinogen, UA: 0.2 E.U./dL
pH, UA: 7 (ref 5.0–8.0)

## 2021-06-04 MED ORDER — MEDROXYPROGESTERONE ACETATE 150 MG/ML IM SUSP
150.0000 mg | Freq: Once | INTRAMUSCULAR | Status: AC
  Administered 2021-06-04: 150 mg via INTRAMUSCULAR

## 2021-06-04 MED ORDER — MEDROXYPROGESTERONE ACETATE 150 MG/ML IM SUSP: 150.0000 mg | INTRAMUSCULAR | 0 refills | Status: DC

## 2021-06-04 NOTE — Progress Notes (Signed)
Date last pap: 05/12/19. Last Depo-Provera: 03/12/21. Side Effects if any: NA. Serum HCG indicated? NA. Depo-Provera 150 mg IM given by: Selena Batten. Johnanna Bakke, RNC in Right Deltoid. Next appointment due 08/20/21-09/03/21.   SUBJECTIVE: Amy Davies is a 35 y.o. female who complains of urinary frequency, urgency and dysuria x 3 days, without flank pain, fever, chills, or abnormal vaginal discharge or bleeding. Recent treatment   OBJECTIVE: Appears well, in no apparent distress.  Vital signs are normal. Urine dipstick shows positive for RBC's.    ASSESSMENT: Dysuria  PLAN: Treatment per orders.  Call or return to clinic prn if these symptoms worsen or fail to improve as anticipated. Per Dr. Debroah Loop, no RX needed at this time. Will wait for urine culture results and TX as indicated. Patient encouraged to increase water intake and to seek care for fever, flank pain, or worsening symptoms.

## 2021-06-04 NOTE — Progress Notes (Signed)
Pt needed refill on Depo- injection appt today. Refill sent, pt to be scheduled for AEX.

## 2021-06-07 LAB — URINE CULTURE

## 2021-08-13 ENCOUNTER — Other Ambulatory Visit: Payer: Self-pay

## 2021-08-13 ENCOUNTER — Other Ambulatory Visit: Payer: Self-pay | Admitting: Obstetrics & Gynecology

## 2021-08-13 DIAGNOSIS — Z3042 Encounter for surveillance of injectable contraceptive: Secondary | ICD-10-CM

## 2021-08-13 MED ORDER — MEDROXYPROGESTERONE ACETATE 150 MG/ML IM SUSP: 150.0000 mg | INTRAMUSCULAR | 0 refills | Status: DC

## 2021-08-15 ENCOUNTER — Other Ambulatory Visit: Payer: Self-pay

## 2021-08-15 ENCOUNTER — Ambulatory Visit (INDEPENDENT_AMBULATORY_CARE_PROVIDER_SITE_OTHER): Payer: Medicaid Other | Admitting: Obstetrics and Gynecology

## 2021-08-15 ENCOUNTER — Encounter: Payer: Self-pay | Admitting: Obstetrics and Gynecology

## 2021-08-15 VITALS — BP 95/68 | HR 85 | Ht 65.5 in | Wt 171.0 lb

## 2021-08-15 DIAGNOSIS — Z01812 Encounter for preprocedural laboratory examination: Secondary | ICD-10-CM

## 2021-08-15 DIAGNOSIS — Z3042 Encounter for surveillance of injectable contraceptive: Secondary | ICD-10-CM | POA: Diagnosis not present

## 2021-08-15 DIAGNOSIS — Z01411 Encounter for gynecological examination (general) (routine) with abnormal findings: Secondary | ICD-10-CM | POA: Diagnosis not present

## 2021-08-15 DIAGNOSIS — R300 Dysuria: Secondary | ICD-10-CM

## 2021-08-15 MED ORDER — MEDROXYPROGESTERONE ACETATE 150 MG/ML IM SUSP
150.0000 mg | INTRAMUSCULAR | 0 refills | Status: DC
Start: 2021-08-15 — End: 2021-08-22

## 2021-08-15 MED ORDER — MEDROXYPROGESTERONE ACETATE 150 MG/ML IM SUSP
150.0000 mg | Freq: Once | INTRAMUSCULAR | Status: AC
  Administered 2021-08-15: 150 mg via INTRAMUSCULAR

## 2021-08-15 NOTE — Progress Notes (Signed)
Subjective:     Amy Davies is a 36 y.o. female P3 with BMI 28 and depo-provera induced amenorrhea who is here for a comprehensive physical exam. The patient reports dysuria which she treated with Azo and cranberry juice. She is sexually active with complaints. She denies pelvic pain or abnormal discharge. Patient is without any other complaints. She is planning on having a cosmetic brazilian gluteus surgery and is in need of surgical clearance. Patient is due for depo-provera today  History reviewed. No pertinent past medical history. Past Surgical History:  Procedure Laterality Date   CESAREAN SECTION N/A 09/06/2015   Procedure: CESAREAN SECTION;  Surgeon: Shelly Bombard, MD;  Location: [facility];  Service: Obstetrics;  Laterality: N/A;   INDUCED ABORTION     Family History  Problem Relation Age of Onset   Asthma Neg Hx    Diabetes Neg Hx    Heart disease Neg Hx    Hypertension Neg Hx    Kidney disease Neg Hx    Stroke Neg Hx    Hearing loss Neg Hx      Social History   Socioeconomic History   Marital status: Single    Spouse name: Not on file   Number of children: Not on file   Years of education: Not on file   Highest education level: Not on file  Occupational History   Not on file  Tobacco Use   Smoking status: Some Days    Types: Cigars   Smokeless tobacco: Never   Tobacco comments:    July 2016  Vaping Use   Vaping Use: Never used  Substance and Sexual Activity   Alcohol use: Yes    Alcohol/week: 0.0 standard drinks    Comment: socially; not since +preg   Drug use: Not Currently    Types: Marijuana    Comment: last was Aug 07/2014   Sexual activity: Yes    Birth control/protection: Injection  Other Topics Concern   Not on file  Social History Narrative   Not on file   Social Determinants of Health   Financial Resource Strain: Not on file  Food Insecurity: Not on file  Transportation Needs: Not on file  Physical Activity: Not on file  Stress: Not on  file  Social Connections: Not on file  Intimate Partner Violence: Not on file   Health Maintenance  Topic Date Due   COVID-19 Vaccine (1) Never done   TETANUS/TDAP  Never done   INFLUENZA VACCINE  Never done   PAP SMEAR-Modifier  05/11/2022   Hepatitis C Screening  Completed   HIV Screening  Completed   HPV VACCINES  Aged Out       Review of Systems Pertinent items noted in HPI and remainder of comprehensive ROS otherwise negative.   Objective:  Blood pressure 95/68, pulse 85, height 5' 5.5" (1.664 m), weight 171 lb (77.6 kg).   GENERAL: Well-developed, well-nourished female in no acute distress.  HEENT: Normocephalic, atraumatic. Sclerae anicteric.  NECK: Supple. Normal thyroid.  LUNGS: Clear to auscultation bilaterally.  HEART: Regular rate and rhythm. BREASTS: Symmetric in size. No palpable masses or lymphadenopathy, skin changes, or nipple drainage. ABDOMEN: Soft, nontender, nondistended. No organomegaly. PELVIC: Normal external female genitalia. Vagina is pink and rugated.  Normal discharge. Normal appearing cervix. Uterus is normal in size. No adnexal mass or tenderness. Chaperone present during the pelvic exam EXTREMITIES: No cyanosis, clubbing, or edema, 2+ distal pulses.     Assessment:    Healthy female  exam.      Plan:    Pap smear collected STI screening per patient request Health maintenance labs ordered Patient is planning a cosmetic surgery and needed lab clearance Urine culture ordered for evaluation of dysuria Patient will be contacted with abnormal results Refill on depo-provera provided See After Visit Summary for Counseling Recommendations

## 2021-08-15 NOTE — Progress Notes (Signed)
Annual exam Requests STI testing, swab and blood Depo, needs refills Last Pap 05/12/19 normal, previous Paps normal Reports urinary frequency, requests urine screen  Has pre-surgery forms and blood work she would like completed.

## 2021-08-16 ENCOUNTER — Other Ambulatory Visit (HOSPITAL_COMMUNITY)
Admission: RE | Admit: 2021-08-16 | Discharge: 2021-08-16 | Disposition: A | Discharge status: Home / Self Care | Payer: Medicaid Other | Source: Ambulatory Visit | Attending: Obstetrics and Gynecology | Admitting: Obstetrics and Gynecology

## 2021-08-16 ENCOUNTER — Other Ambulatory Visit: Payer: Self-pay

## 2021-08-16 DIAGNOSIS — Z01411 Encounter for gynecological examination (general) (routine) with abnormal findings: Secondary | ICD-10-CM

## 2021-08-16 LAB — CBC
Hematocrit: 41.6 % (ref 34.0–46.6)
Hemoglobin: 13.9 g/dL (ref 11.1–15.9)
MCH: 32 pg (ref 26.6–33.0)
MCHC: 33.4 g/dL (ref 31.5–35.7)
MCV: 96 fL (ref 79–97)
Platelets: 274 10*3/uL (ref 150–450)
RBC: 4.35 x10E6/uL (ref 3.77–5.28)
RDW: 12.4 % (ref 11.7–15.4)
WBC: 8.5 10*3/uL (ref 3.4–10.8)

## 2021-08-16 LAB — COMPREHENSIVE METABOLIC PANEL
ALT: 19 IU/L (ref 0–32)
AST: 23 IU/L (ref 0–40)
Albumin/Globulin Ratio: 1.7 (ref 1.2–2.2)
Albumin: 4.3 g/dL (ref 3.8–4.8)
Alkaline Phosphatase: 57 IU/L (ref 44–121)
BUN/Creatinine Ratio: 16 (ref 9–23)
BUN: 13 mg/dL (ref 6–20)
Bilirubin Total: 0.3 mg/dL (ref 0.0–1.2)
CO2: 21 mmol/L (ref 20–29)
Calcium: 9 mg/dL (ref 8.7–10.2)
Chloride: 105 mmol/L (ref 96–106)
Creatinine, Ser: 0.82 mg/dL (ref 0.57–1.00)
Globulin, Total: 2.6 g/dL (ref 1.5–4.5)
Glucose: 75 mg/dL (ref 70–99)
Potassium: 4.4 mmol/L (ref 3.5–5.2)
Sodium: 139 mmol/L (ref 134–144)
Total Protein: 6.9 g/dL (ref 6.0–8.5)
eGFR: 94 mL/min/{1.73_m2} (ref >59)

## 2021-08-16 LAB — TSH: TSH: 1.04 u[IU]/mL (ref 0.450–4.500)

## 2021-08-16 LAB — HEMOGLOBIN A1C
Est. average glucose Bld gHb Est-mCnc: 108 mg/dL
Hgb A1c MFr Bld: 5.4 % (ref 4.8–5.6)

## 2021-08-16 LAB — LIPID PANEL
Chol/HDL Ratio: 3.7 ratio (ref 0.0–4.4)
Cholesterol, Total: 163 mg/dL (ref 100–199)
HDL: 44 mg/dL (ref >39)
LDL Chol Calc (NIH): 108 mg/dL — ABNORMAL HIGH (ref 0–99)
Triglycerides: 56 mg/dL (ref 0–149)
VLDL Cholesterol Cal: 11 mg/dL (ref 5–40)

## 2021-08-16 LAB — CERVICOVAGINAL ANCILLARY ONLY
Chlamydia: NEGATIVE
Comment: NEGATIVE
Comment: NEGATIVE
Comment: NORMAL
Neisseria Gonorrhea: NEGATIVE
Trichomonas: NEGATIVE

## 2021-08-16 LAB — HEPATITIS B SURFACE ANTIGEN: Hepatitis B Surface Ag: NEGATIVE

## 2021-08-16 LAB — HIV ANTIBODY (ROUTINE TESTING W REFLEX): HIV Screen 4th Generation wRfx: NONREACTIVE

## 2021-08-16 LAB — HEPATITIS C ANTIBODY: Hep C Virus Ab: <0.1 s/co ratio (ref 0.0–0.9)

## 2021-08-16 LAB — APTT: aPTT: 31 s (ref 24–33)

## 2021-08-16 LAB — RPR: RPR Ser Ql: NONREACTIVE

## 2021-08-16 NOTE — Progress Notes (Signed)
[facility] called they have the pap vial but no order.

## 2021-08-17 LAB — URINE CULTURE

## 2021-08-20 ENCOUNTER — Encounter: Payer: Self-pay | Admitting: Obstetrics

## 2021-08-22 ENCOUNTER — Encounter: Payer: Self-pay | Admitting: Nurse Practitioner

## 2021-08-22 ENCOUNTER — Other Ambulatory Visit: Payer: Self-pay

## 2021-08-22 ENCOUNTER — Ambulatory Visit (INDEPENDENT_AMBULATORY_CARE_PROVIDER_SITE_OTHER): Payer: 59 | Admitting: Nurse Practitioner

## 2021-08-22 VITALS — BP 110/75 | HR 75 | Temp 97.8°F | Ht 65.5 in | Wt 170.0 lb

## 2021-08-22 DIAGNOSIS — Z7689 Persons encountering health services in other specified circumstances: Secondary | ICD-10-CM | POA: Diagnosis not present

## 2021-08-22 DIAGNOSIS — Z6827 Body mass index (BMI) 27.0-27.9, adult: Secondary | ICD-10-CM | POA: Diagnosis not present

## 2021-08-22 NOTE — Progress Notes (Signed)
New Patient Office Visit  Subjective:  Patient ID: Amy Davies, female    DOB: [birth year]  Age: 36 y.o. MRN: [id number]  CC:  Chief Complaint  Patient presents with   New Patient (Initial Visit)    HPI Amy Davies presents to establish new primary care provider. She is getting ready to have cosmetic procedure. She is have "360 lipo and BBL." Her GYN provider has done much of the blood work, however, some of the requested labs are missing and EKG.   History reviewed. No pertinent past medical history.  Past Surgical History:  Procedure Laterality Date   CESAREAN SECTION N/A 09/06/2015   Procedure: CESAREAN SECTION;  Surgeon: Brock Bad, MD;  Location: [facility];  Service: Obstetrics;  Laterality: N/A;   INDUCED ABORTION      Family History  Problem Relation Age of Onset   Asthma Neg Hx    Diabetes Neg Hx    Heart disease Neg Hx    Hypertension Neg Hx    Kidney disease Neg Hx    Stroke Neg Hx    Hearing loss Neg Hx     Social History   Socioeconomic History   Marital status: Single    Spouse name: Not on file   Number of children: Not on file   Years of education: Not on file   Highest education level: Not on file  Occupational History   Not on file  Tobacco Use   Smoking status: Some Days    Types: Cigars   Smokeless tobacco: Never   Tobacco comments:    July 2016  Vaping Use   Vaping Use: Never used  Substance and Sexual Activity   Alcohol use: Yes    Alcohol/week: 0.0 standard drinks    Comment: socially; not since +preg   Drug use: Not Currently    Types: Marijuana    Comment: last was Aug 07/2014   Sexual activity: Yes    Birth control/protection: Injection  Other Topics Concern   Not on file  Social History Narrative   Not on file   Social Determinants of Health   Financial Resource Strain: Not on file  Food Insecurity: Not on file  Transportation Needs: Not on file  Physical Activity: Not on file  Stress: Not on file  Social  Connections: Not on file  Intimate Partner Violence: Not on file    ROS Review of Systems  Constitutional:  Negative for activity change, appetite change, chills, fatigue and fever.  HENT:  Negative for congestion, postnasal drip, rhinorrhea, sinus pressure, sinus pain, sneezing and sore throat.   Eyes: Negative.   Respiratory:  Negative for cough, chest tightness, shortness of breath and wheezing.   Cardiovascular:  Negative for chest pain and palpitations.  Gastrointestinal:  Negative for abdominal pain, constipation, diarrhea, nausea and vomiting.  Endocrine: Negative for cold intolerance, heat intolerance, polydipsia and polyuria.  Genitourinary:  Negative for dyspareunia, dysuria, flank pain, frequency and urgency.  Musculoskeletal:  Negative for arthralgias, back pain and myalgias.  Skin:  Negative for rash.  Allergic/Immunologic: Negative for environmental allergies.  Neurological:  Negative for dizziness, weakness and headaches.  Hematological:  Negative for adenopathy.  Psychiatric/Behavioral:  The patient is not nervous/anxious.    Objective:   Today's Vitals   08/22/21 1114  BP: 110/75  Pulse: 75  Temp: 97.8 F (36.6 C)  SpO2: 100%  Weight: 170 lb (77.1 kg)  Height: 5' 5.5" (1.664 m)   Body mass index is  27.86 kg/m.   Physical Exam Vitals and nursing note reviewed.  Constitutional:      Appearance: Normal appearance. She is well-developed.  HENT:     Head: Normocephalic and atraumatic.  Eyes:     Pupils: Pupils are equal, round, and reactive to light.  Cardiovascular:     Rate and Rhythm: Normal rate and regular rhythm.     Pulses: Normal pulses.     Heart sounds: Normal heart sounds.  Pulmonary:     Effort: Pulmonary effort is normal.     Breath sounds: Normal breath sounds.  Abdominal:     Palpations: Abdomen is soft.  Musculoskeletal:        General: Normal range of motion.     Cervical back: Normal range of motion and neck supple.   Lymphadenopathy:     Cervical: No cervical adenopathy.  Skin:    General: Skin is warm and dry.     Capillary Refill: Capillary refill takes less than 2 seconds.  Neurological:     General: No focal deficit present.     Mental Status: She is alert and oriented to person, place, and time.  Psychiatric:        Mood and Affect: Mood normal.        Behavior: Behavior normal.        Thought Content: Thought content normal.        Judgment: Judgment normal.    Assessment & Plan:  1. Encounter to establish care Appointment today to establish new primary care provider. Reviewed recent labs from [facility] provider. Will get remainder of requeted labs from surgery and surgical clearance exam at next visit. Will complete paperwork after all labs have been resulted.   2. Body mass index 27.0-27.9, adult Discussed lowering calorie intake to 1500 calories per day and incorporating exercise into daily routine to help lose weight.    Problem List Items Addressed This Visit       Other   Body mass index 27.0-27.9, adult   Other Visit Diagnoses     Encounter to establish care    -  Primary       Outpatient Encounter Medications as of 08/22/2021  Medication Sig   [DISCONTINUED] azelastine (ASTELIN) 0.1 % nasal spray Place 2 sprays into both nostrils 2 (two) times daily. (Patient not taking: No sig reported)   [DISCONTINUED] benzonatate (TESSALON) 100 MG capsule Take 1-2 capsules (100-200 mg total) by mouth every 8 (eight) hours.   [DISCONTINUED] fexofenadine-pseudoephedrine (ALLEGRA-D 24) 180-240 MG 24 hr tablet Take 1 tablet by mouth daily.   [DISCONTINUED] fluticasone (FLONASE) 50 MCG/ACT nasal spray Place 1-2 sprays into both nostrils daily. (Patient not taking: Reported on 08/15/2021)   [DISCONTINUED] ibuprofen (ADVIL) 800 MG tablet Take 1 tablet (800 mg total) by mouth 3 (three) times daily. (Patient not taking: Reported on 09/28/2020)   [DISCONTINUED] medroxyPROGESTERone (DEPO-PROVERA) 150 MG/ML  injection Inject 1 mL (150 mg total) into the muscle every 3 (three) months.   [DISCONTINUED] NON FORMULARY Otc sinus medicine (Patient not taking: Reported on 09/28/2020)   [DISCONTINUED] sulfamethoxazole-trimethoprim (BACTRIM DS) 800-160 MG tablet Take 1 tablet by mouth 2 (two) times daily.   Facility-Administered Encounter Medications as of 08/22/2021  Medication   medroxyPROGESTERone (DEPO-PROVERA) injection 150 mg    Follow-up: Return in about 1 week (around 08/29/2021) for surgical clearance - needs EKG, CBC/Diff and PT/INR .   Carlean Jews, NP

## 2021-08-26 ENCOUNTER — Other Ambulatory Visit: Payer: Self-pay

## 2021-08-26 DIAGNOSIS — Z01818 Encounter for other preprocedural examination: Secondary | ICD-10-CM

## 2021-08-26 DIAGNOSIS — Z6828 Body mass index (BMI) 28.0-28.9, adult: Secondary | ICD-10-CM | POA: Insufficient documentation

## 2021-08-26 DIAGNOSIS — Z6827 Body mass index (BMI) 27.0-27.9, adult: Secondary | ICD-10-CM | POA: Insufficient documentation

## 2021-08-27 LAB — CYTOLOGY - PAP
Adequacy: ABSENT
Comment: NEGATIVE
Diagnosis: NEGATIVE
High risk HPV: NEGATIVE

## 2021-08-30 ENCOUNTER — Encounter: Payer: Self-pay | Admitting: Nurse Practitioner

## 2021-08-30 ENCOUNTER — Ambulatory Visit (INDEPENDENT_AMBULATORY_CARE_PROVIDER_SITE_OTHER): Payer: 59 | Admitting: Nurse Practitioner

## 2021-08-30 ENCOUNTER — Other Ambulatory Visit: Payer: Self-pay

## 2021-08-30 VITALS — BP 116/70 | HR 78 | Temp 98.2°F | Ht 65.5 in | Wt 176.2 lb

## 2021-08-30 DIAGNOSIS — Z6828 Body mass index (BMI) 28.0-28.9, adult: Secondary | ICD-10-CM | POA: Diagnosis not present

## 2021-08-30 DIAGNOSIS — R9431 Abnormal electrocardiogram [ECG] [EKG]: Secondary | ICD-10-CM | POA: Diagnosis not present

## 2021-08-30 DIAGNOSIS — Z01818 Encounter for other preprocedural examination: Secondary | ICD-10-CM

## 2021-08-30 NOTE — Progress Notes (Signed)
Reviewed with patient during visit

## 2021-08-30 NOTE — Progress Notes (Signed)
Established patient visit   Patient: Amy Davies   DOB: [birth year]   36 y.o. Female  MRN: [id number] Visit Date: 08/30/2021   Chief Complaint  Patient presents with   Procedure   Subjective    HPI  The patient is here to have surgical clearance. She is going to [place], [place] to have cosmetic procedure. She is having liposuction and [place] but lift. She feels well. She needs to have labs done as well as ECG.  -no problems or concerns to report.  -surgery is going to be in beginning of April. She will be out of work for the entire month of April.    Medications: No outpatient medications prior to visit.   Facility-Administered Medications Prior to Visit  Medication Dose Route Frequency Provider   medroxyPROGESTERone (DEPO-PROVERA) injection 150 mg  150 mg Intramuscular Q90 days Denney, Rachelle A, CNM    Review of Systems  Constitutional:  Negative for activity change, appetite change, chills, fatigue and fever.  HENT:  Negative for congestion, postnasal drip, rhinorrhea, sinus pressure, sinus pain, sneezing and sore throat.   Eyes: Negative.   Respiratory:  Negative for cough, chest tightness, shortness of breath and wheezing.   Cardiovascular:  Negative for chest pain and palpitations.  Gastrointestinal:  Negative for abdominal pain, constipation, diarrhea, nausea and vomiting.  Endocrine: Negative for cold intolerance, heat intolerance, polydipsia and polyuria.  Genitourinary:  Negative for dyspareunia, dysuria, flank pain, frequency and urgency.  Musculoskeletal:  Negative for arthralgias, back pain and myalgias.  Skin:  Negative for rash.  Allergic/Immunologic: Negative for environmental allergies.  Neurological:  Negative for dizziness, weakness and headaches.  Hematological:  Negative for adenopathy.  Psychiatric/Behavioral:  The patient is not nervous/anxious.    Last CBC Lab Results  Component Value Date   WBC 5.7 08/30/2021   HGB 13.5 08/30/2021   HCT 40.2  08/30/2021   MCV 95 08/30/2021   MCH 31.8 08/30/2021   RDW 12.8 08/30/2021   PLT 294 39/09/90   Last metabolic panel Lab Results  Component Value Date   GLUCOSE 75 08/15/2021   NA 139 08/15/2021   K 4.4 08/15/2021   CL 105 08/15/2021   CO2 21 08/15/2021   BUN 13 08/15/2021   CREATININE 0.82 08/15/2021   EGFR 94 08/15/2021   CALCIUM 9.0 08/15/2021   PROT 6.9 08/15/2021   ALBUMIN 4.3 08/15/2021   LABGLOB 2.6 08/15/2021   AGRATIO 1.7 08/15/2021   BILITOT 0.3 08/15/2021   ALKPHOS 57 08/15/2021   AST 23 08/15/2021   ALT 19 08/15/2021   ANIONGAP 6 10/31/2014   Last hemoglobin A1c Lab Results  Component Value Date   HGBA1C 5.4 08/15/2021       Objective     Today's Vitals   08/30/21 0846  BP: 116/70  Pulse: 78  Temp: 98.2 F (36.8 C)  SpO2: 99%  Weight: 176 lb 3.2 oz (79.9 kg)  Height: 5' 5.5" (1.664 m)   Body mass index is 28.88 kg/m.   BP Readings from Last 3 Encounters:  08/30/21 116/70  08/22/21 110/75  08/15/21 95/68    Wt Readings from Last 3 Encounters:  08/30/21 176 lb 3.2 oz (79.9 kg)  08/22/21 170 lb (77.1 kg)  08/15/21 171 lb (77.6 kg)    Physical Exam Vitals and nursing note reviewed.  Constitutional:      Appearance: Normal appearance. She is well-developed.  HENT:     Head: Normocephalic and atraumatic.  Eyes:     Pupils: Pupils  are equal, round, and reactive to light.  Cardiovascular:     Rate and Rhythm: Normal rate and regular rhythm.     Pulses: Normal pulses.     Heart sounds: Normal heart sounds.     Comments: ECG is abnormal today showing normal sinus rhythm with sinus tachycardia. Cannot rule out anterior infarct. (Age undetermined) Pulmonary:     Effort: Pulmonary effort is normal.     Breath sounds: Normal breath sounds.  Abdominal:     Palpations: Abdomen is soft.  Musculoskeletal:        General: Normal range of motion.     Cervical back: Normal range of motion and neck supple.  Lymphadenopathy:     Cervical: No  cervical adenopathy.  Skin:    General: Skin is warm and dry.     Capillary Refill: Capillary refill takes less than 2 seconds.  Neurological:     General: No focal deficit present.     Mental Status: She is alert and oriented to person, place, and time.  Psychiatric:        Mood and Affect: Mood normal.        Behavior: Behavior normal.        Thought Content: Thought content normal.        Judgment: Judgment normal.      Results for orders placed or performed in visit on 08/30/21  CBC with Differential/Platelet  Result Value Ref Range   WBC 5.7 3.4 - 10.8 x10E3/uL   RBC 4.25 3.77 - 5.28 x10E6/uL   Hemoglobin 13.5 11.1 - 15.9 g/dL   Hematocrit 40.2 34.0 - 46.6 %   MCV 95 79 - 97 fL   MCH 31.8 26.6 - 33.0 pg   MCHC 33.6 31.5 - 35.7 g/dL   RDW 12.8 11.7 - 15.4 %   Platelets 294 150 - 450 x10E3/uL   Neutrophils 43 Not Estab. %   Lymphs 49 Not Estab. %   Monocytes 7 Not Estab. %   Eos 1 Not Estab. %   Basos 0 Not Estab. %   Neutrophils Absolute 2.5 1.4 - 7.0 x10E3/uL   Lymphocytes Absolute 2.8 0.7 - 3.1 x10E3/uL   Monocytes Absolute 0.4 0.1 - 0.9 x10E3/uL   EOS (ABSOLUTE) 0.0 0.0 - 0.4 x10E3/uL   Basophils Absolute 0.0 0.0 - 0.2 x10E3/uL   Immature Granulocytes 0 Not Estab. %   Immature Grans (Abs) 0.0 0.0 - 0.1 x10E3/uL  Protime-INR  Result Value Ref Range   INR 1.0 0.9 - 1.2   Prothrombin Time 10.3 9.1 - 12.0 sec    Assessment & Plan    1. Pre-procedural examination The patient is clear for cosmetic surgery with minimal to low risk.  - CBC with Differential/Platelet - Protime-INR - EKG 12-Lead  2. Body mass index 28.0-28.9, adult Discussed lowering calorie intake to 1500 calories per day and incorporating exercise into daily routine to help lose weight.   3. Abnormal ECG ECG showing normal sinus rhythm with sinus arrhythmia. Anterior infarct unable to be ruled out per ECG. Patient has no cardiac history or family history of serious cardiac history. I believe she  is a low risk candidate for cosmetic surgery at this time.    Problem List Items Addressed This Visit       Other   Body mass index 28.0-28.9, adult   Abnormal ECG   Other Visit Diagnoses     Pre-procedural examination    -  Primary   Relevant Orders  EKG 12-Lead (Completed)        Return in about 1 year (around 08/30/2022) for health maintenance exam, FBW at time of visit.         Ronnell Freshwater, NP  [facility] [phone] (phone) [phone] (fax)  [place]

## 2021-08-31 LAB — CBC WITH DIFFERENTIAL/PLATELET
Basophils Absolute: 0 10*3/uL (ref 0.0–0.2)
Basos: 0 %
EOS (ABSOLUTE): 0 10*3/uL (ref 0.0–0.4)
Eos: 1 %
Hematocrit: 40.2 % (ref 34.0–46.6)
Hemoglobin: 13.5 g/dL (ref 11.1–15.9)
Immature Grans (Abs): 0 10*3/uL (ref 0.0–0.1)
Immature Granulocytes: 0 %
Lymphocytes Absolute: 2.8 10*3/uL (ref 0.7–3.1)
Lymphs: 49 %
MCH: 31.8 pg (ref 26.6–33.0)
MCHC: 33.6 g/dL (ref 31.5–35.7)
MCV: 95 fL (ref 79–97)
Monocytes Absolute: 0.4 10*3/uL (ref 0.1–0.9)
Monocytes: 7 %
Neutrophils Absolute: 2.5 10*3/uL (ref 1.4–7.0)
Neutrophils: 43 %
Platelets: 294 10*3/uL (ref 150–450)
RBC: 4.25 x10E6/uL (ref 3.77–5.28)
RDW: 12.8 % (ref 11.7–15.4)
WBC: 5.7 10*3/uL (ref 3.4–10.8)

## 2021-08-31 LAB — PROTIME-INR
INR: 1 (ref 0.9–1.2)
Prothrombin Time: 10.3 s (ref 9.1–12.0)

## 2021-09-03 DIAGNOSIS — R9431 Abnormal electrocardiogram [ECG] [EKG]: Secondary | ICD-10-CM | POA: Insufficient documentation

## 2021-09-04 ENCOUNTER — Encounter: Payer: Self-pay | Admitting: Nurse Practitioner

## 2021-09-04 ENCOUNTER — Telehealth: Payer: Self-pay | Admitting: Nurse Practitioner

## 2021-09-04 ENCOUNTER — Other Ambulatory Visit: Payer: Self-pay | Admitting: Nurse Practitioner

## 2021-09-04 DIAGNOSIS — R9431 Abnormal electrocardiogram [ECG] [EKG]: Secondary | ICD-10-CM

## 2021-09-04 DIAGNOSIS — Z01818 Encounter for other preprocedural examination: Secondary | ICD-10-CM

## 2021-09-04 NOTE — Telephone Encounter (Signed)
Patient called stating the surgical center is requesting her to have cardiac clearance before performing her surgery but she needs a referral. AS< CMA

## 2021-09-04 NOTE — Progress Notes (Signed)
Referral placed to cardiology due to abnormal ecg on presurgical clearance visit

## 2021-09-04 NOTE — Telephone Encounter (Signed)
Referral placed to cardiology due to abnormal ecg on presurgical clearance visit  °

## 2021-10-01 ENCOUNTER — Other Ambulatory Visit (HOSPITAL_COMMUNITY)
Admission: RE | Admit: 2021-10-01 | Discharge: 2021-10-01 | Disposition: A | Discharge status: Home / Self Care | Payer: 59 | Source: Ambulatory Visit | Attending: Obstetrics | Admitting: Obstetrics

## 2021-10-01 ENCOUNTER — Ambulatory Visit (INDEPENDENT_AMBULATORY_CARE_PROVIDER_SITE_OTHER): Payer: 59 | Admitting: Emergency Medicine

## 2021-10-01 ENCOUNTER — Other Ambulatory Visit: Payer: Self-pay

## 2021-10-01 VITALS — BP 104/66 | HR 82 | Ht 66.0 in | Wt 176.7 lb

## 2021-10-01 DIAGNOSIS — N898 Other specified noninflammatory disorders of vagina: Secondary | ICD-10-CM | POA: Insufficient documentation

## 2021-10-01 DIAGNOSIS — R8290 Unspecified abnormal findings in urine: Secondary | ICD-10-CM | POA: Diagnosis not present

## 2021-10-01 LAB — POCT URINALYSIS DIPSTICK
Bilirubin, UA: NEGATIVE
Glucose, UA: NEGATIVE
Ketones, UA: NEGATIVE
Leukocytes, UA: NEGATIVE
Nitrite, UA: NEGATIVE
Protein, UA: NEGATIVE
Spec Grav, UA: 1.015 (ref 1.010–1.025)
Urobilinogen, UA: NEGATIVE E.U./dL — AB
pH, UA: 7 (ref 5.0–8.0)

## 2021-10-01 NOTE — Progress Notes (Signed)
SUBJECTIVE: Amy Davies is a 36 y.o. female who complains of urinary frequency, urgency and dysuria x 10 days, without flank pain, fever, chills, or abnormal vaginal discharge or bleeding.  ? ?OBJECTIVE: Appears well, in no apparent distress.  Vital signs are normal. Urine dipstick shows negative for all components.   ? ?ASSESSMENT: Dysuria ? ? ?PLAN: Treatment per orders.  Call or return to clinic prn if these symptoms worsen or fail to improve as anticipated.  ?Dipstick -Negative, not consistent with symptoms. Urine culture ordered. ? ?Also would like to perform a self-swab for STI screen d/t concerns for odor after urination.  ?

## 2021-10-02 LAB — CERVICOVAGINAL ANCILLARY ONLY
Bacterial Vaginitis (gardnerella): NEGATIVE
Candida Glabrata: NEGATIVE
Candida Vaginitis: NEGATIVE
Chlamydia: NEGATIVE
Comment: NEGATIVE
Comment: NEGATIVE
Comment: NEGATIVE
Comment: NEGATIVE
Comment: NEGATIVE
Comment: NORMAL
Neisseria Gonorrhea: NEGATIVE
Trichomonas: NEGATIVE

## 2021-10-04 LAB — URINE CULTURE

## 2021-10-05 ENCOUNTER — Other Ambulatory Visit: Payer: Self-pay | Admitting: Obstetrics and Gynecology

## 2021-10-05 DIAGNOSIS — B9620 Unspecified Escherichia coli [E. coli] as the cause of diseases classified elsewhere: Secondary | ICD-10-CM

## 2021-10-05 MED ORDER — SULFAMETHOXAZOLE-TRIMETHOPRIM 800-160 MG PO TABS: 1.0000 | ORAL_TABLET | Freq: Two times a day (BID) | ORAL | 0 refills | Status: DC

## 2021-10-08 ENCOUNTER — Encounter: Payer: Self-pay | Admitting: Obstetrics and Gynecology

## 2021-10-08 ENCOUNTER — Other Ambulatory Visit: Payer: Self-pay

## 2021-10-08 MED ORDER — FLUCONAZOLE 150 MG PO TABS: 150.0000 mg | ORAL_TABLET | Freq: Once | ORAL | 0 refills | Status: AC

## 2021-11-04 ENCOUNTER — Ambulatory Visit: Payer: Medicaid Other

## 2021-11-06 ENCOUNTER — Ambulatory Visit (INDEPENDENT_AMBULATORY_CARE_PROVIDER_SITE_OTHER): Payer: Medicaid Other | Admitting: Emergency Medicine

## 2021-11-06 DIAGNOSIS — Z3042 Encounter for surveillance of injectable contraceptive: Secondary | ICD-10-CM | POA: Diagnosis not present

## 2021-11-06 NOTE — Progress Notes (Signed)
Date last pap: 08/16/21. ?Last Depo-Provera: 08/13/2021. ?Side Effects if any: 08/13/21. ?Serum HCG indicated? NA. ?Depo-Provera 150 mg IM given by: Corinna Lines, RN into right deltoid, no adverse reactions noted. ?Next appointment due Jul 12-Jul 26.  ?

## 2021-11-06 NOTE — Progress Notes (Signed)
Agree with nurses's documentation of this patient's clinic encounter.  Schuyler Behan L, MD  

## 2021-12-03 ENCOUNTER — Encounter: Payer: Self-pay | Admitting: Nurse Practitioner

## 2021-12-03 ENCOUNTER — Ambulatory Visit (INDEPENDENT_AMBULATORY_CARE_PROVIDER_SITE_OTHER): Payer: 59 | Admitting: Nurse Practitioner

## 2021-12-03 VITALS — BP 129/82 | HR 90 | Temp 98.2°F | Ht 66.14 in | Wt 177.0 lb

## 2021-12-03 DIAGNOSIS — B379 Candidiasis, unspecified: Secondary | ICD-10-CM

## 2021-12-03 DIAGNOSIS — M792 Neuralgia and neuritis, unspecified: Secondary | ICD-10-CM

## 2021-12-03 DIAGNOSIS — L03312 Cellulitis of back [any part except buttock]: Secondary | ICD-10-CM | POA: Diagnosis not present

## 2021-12-03 MED ORDER — FLUCONAZOLE 150 MG PO TABS: ORAL_TABLET | ORAL | 0 refills | Status: DC

## 2021-12-03 MED ORDER — IBUPROFEN 800 MG PO TABS: 800.0000 mg | ORAL_TABLET | Freq: Three times a day (TID) | ORAL | 0 refills | Status: DC | PRN

## 2021-12-03 MED ORDER — CEPHALEXIN 500 MG PO CAPS: 500.0000 mg | ORAL_CAPSULE | Freq: Three times a day (TID) | ORAL | 0 refills | Status: DC

## 2021-12-03 MED ORDER — GABAPENTIN 100 MG PO CAPS: ORAL_CAPSULE | ORAL | 3 refills | Status: DC

## 2021-12-03 NOTE — Progress Notes (Signed)
Established patient visit   Patient: Amy Davies   DOB: May 29, 1986   36 y.o. Female  MRN: [id number] Visit Date: 12/03/2021   Chief Complaint  Patient presents with   Follow-up   Subjective    HPI  The patient had cosmetic surgery done 10/18/2021. She had "360 lipo" and "[place] butt lift."  -she states that skin is still painful. Feels like it is burning. It's not all the time. Every time she stretches, she states that she has the burning sensation all over again.  -she did the recommended massages post surgery.  -surgery was done in [place], [place]. There is no good way for her to follow up with surgeon.  -states that she is ok to lay or sit down. When she sits orstands up, the pain is like fire. This is mostly in the back.  -states that she can feel some pockets of fluid in the abdomen and still sees swelling. But this does not hurt like the skin of her back.  -she states that when she is up for a while, this gets better.  -she denies fever   Medications: Outpatient Medications Prior to Visit  Medication Sig   [DISCONTINUED] sulfamethoxazole-trimethoprim (BACTRIM DS) 800-160 MG tablet Take 1 tablet by mouth 2 (two) times daily. (Patient not taking: Reported on 11/06/2021)   Facility-Administered Medications Prior to Visit  Medication Dose Route Frequency Provider   medroxyPROGESTERone (DEPO-PROVERA) injection 150 mg  150 mg Intramuscular Q90 days Denney, Rachelle A, CNM    Review of Systems  Constitutional:  Positive for activity change. Negative for appetite change, chills, fatigue and fever.       Patient having a difficult time going from lying to sitting and sitting to standing due to the pain she feels in the skin of her back.   HENT:  Negative for congestion, postnasal drip, rhinorrhea, sinus pressure, sinus pain, sneezing and sore throat.   Eyes: Negative.   Respiratory:  Negative for cough, chest tightness, shortness of breath and wheezing.   Cardiovascular:  Negative for  chest pain and palpitations.  Gastrointestinal:  Positive for anal bleeding. Negative for abdominal pain, constipation, diarrhea, nausea and vomiting.       Abdominal swelling. This is post operative.   Endocrine: Negative for cold intolerance, heat intolerance, polydipsia and polyuria.  Genitourinary:  Negative for dyspareunia, dysuria, flank pain, frequency and urgency.  Musculoskeletal:  Negative for arthralgias, back pain and myalgias.  Skin:  Negative for rash.       Burning sensation of the skin, mostly her back.   Allergic/Immunologic: Negative for environmental allergies.  Neurological:  Negative for dizziness, weakness and headaches.  Hematological:  Negative for adenopathy.  Psychiatric/Behavioral:  The patient is not nervous/anxious.      Objective     Today's Vitals   12/03/21 1611  BP: 129/82  Pulse: 90  Temp: 98.2 F (36.8 C)  SpO2: 100%  Weight: 177 lb (80.3 kg)  Height: 5' 6.14" (1.68 m)   Body mass index is 28.45 kg/m.   BP Readings from Last 3 Encounters:  12/03/21 129/82  11/06/21 102/70  10/01/21 104/66    Wt Readings from Last 3 Encounters:  12/03/21 177 lb (80.3 kg)  11/06/21 174 lb (78.9 kg)  10/01/21 176 lb 11.2 oz (80.2 kg)    Physical Exam Vitals and nursing note reviewed.  Constitutional:      Appearance: Normal appearance. She is well-developed.     Comments: Patient appears to be uncomfortable and in  pain when walking.   HENT:     Head: Normocephalic.     Nose: Nose normal.     Mouth/Throat:     Mouth: Mucous membranes are moist.     Pharynx: Oropharynx is clear.  Eyes:     Extraocular Movements: Extraocular movements intact.     Conjunctiva/sclera: Conjunctivae normal.     Pupils: Pupils are equal, round, and reactive to light.  Cardiovascular:     Rate and Rhythm: Normal rate and regular rhythm.     Pulses: Normal pulses.     Heart sounds: Normal heart sounds.  Pulmonary:     Effort: Pulmonary effort is normal.     Breath  sounds: Normal breath sounds.  Abdominal:     General: A surgical scar is present. Bowel sounds are normal. There is distension.     Palpations: Abdomen is soft.     Tenderness: There is generalized abdominal tenderness.     Comments: There are pockets of subcutaneous fluid under surface of the skin. Slightly tender with palpation. No evidence of infection or abnormal healing of surgical scars.   Musculoskeletal:        General: Normal range of motion.     Cervical back: Normal range of motion and neck supple.  Lymphadenopathy:     Cervical: No cervical adenopathy.  Skin:    General: Skin is warm and dry.     Capillary Refill: Capillary refill takes less than 2 seconds.     Comments: The skin of the back is very warm to palpate, though ne redness  or focal areas of infection are noted at this time. Incisions have healed well. No rash. No abscess noted. No pockets of fluid noted at this time.   Neurological:     General: No focal deficit present.     Mental Status: She is alert and oriented to person, place, and time.  Psychiatric:        Mood and Affect: Mood normal.        Behavior: Behavior normal.        Thought Content: Thought content normal.        Judgment: Judgment normal.      Assessment & Plan    1. Neuropathic pain May take ibuprofen 800mg  up to three times daily as needed for pain. Add gabapentin 100mg . Start with taking one capsule at bedtime. If tolerating well, may increase to twice daily, then three times daily as needed for burning pain in back. Will reassess in one week and adjust dosing as indicated.  - ibuprofen (ADVIL) 800 MG tablet; Take 1 tablet (800 mg total) by mouth every 8 (eight) hours as needed.  Dispense: 30 tablet; Refill: 0 - gabapentin (NEURONTIN) 100 MG capsule; Take 1 cap po QPM prn. May gradually titrate to Tid prn pain  Dispense: 90 capsule; Refill: 3  2. Cellulitis of back except buttock Concern for cellulitis causing pain and burning sensation  and palpable warmth of the skin of the back. Start keflex 500mg  three times daily for next 10 days. Reassess in one week.  - cephALEXin (KEFLEX) 500 MG capsule; Take 1 capsule (500 mg total) by mouth 3 (three) times daily.  Dispense: 30 capsule; Refill: 0  3. Yeast infection May take diflucan once if yeast infection develops. Repeat dosing in three days as needed for persistent or recurrent symptoms.  - fluconazole (DIFLUCAN) 150 MG tablet; Take 1 tablet po once. May repeat dose in 3 days as needed for persistent  symptoms.  Dispense: 3 tablet; Refill: 0   Problem List Items Addressed This Visit   None Visit Diagnoses     Neuropathic pain    -  Primary   Relevant Medications   ibuprofen (ADVIL) 800 MG tablet   gabapentin (NEURONTIN) 100 MG capsule   Cellulitis of back except buttock       Relevant Medications   cephALEXin (KEFLEX) 500 MG capsule   Yeast infection       Relevant Medications   cephALEXin (KEFLEX) 500 MG capsule   fluconazole (DIFLUCAN) 150 MG tablet        Return in about 1 week (around 12/10/2021) for post op infection and pain .         Carlean JewsHeather E Autymn Omlor, NP  [facility] [phone] (phone) [phone] (fax)  [facility]

## 2021-12-10 ENCOUNTER — Encounter: Payer: Self-pay | Admitting: Nurse Practitioner

## 2021-12-10 ENCOUNTER — Ambulatory Visit (INDEPENDENT_AMBULATORY_CARE_PROVIDER_SITE_OTHER): Payer: 59 | Admitting: Nurse Practitioner

## 2021-12-10 VITALS — BP 119/79 | HR 74 | Temp 97.6°F | Ht 66.14 in | Wt 176.1 lb

## 2021-12-10 DIAGNOSIS — M792 Neuralgia and neuritis, unspecified: Secondary | ICD-10-CM | POA: Diagnosis not present

## 2021-12-10 DIAGNOSIS — L03312 Cellulitis of back [any part except buttock]: Secondary | ICD-10-CM

## 2021-12-10 MED ORDER — KETOROLAC TROMETHAMINE 10 MG PO TABS: 10.0000 mg | ORAL_TABLET | Freq: Four times a day (QID) | ORAL | 0 refills | Status: DC | PRN

## 2021-12-10 MED ORDER — CEPHALEXIN 500 MG PO CAPS: 500.0000 mg | ORAL_CAPSULE | Freq: Three times a day (TID) | ORAL | 0 refills | Status: DC

## 2021-12-10 NOTE — Progress Notes (Signed)
Established patient visit   Patient: Amy Davies   DOB: 1986/05/22   36 y.o. Female  MRN: [id number] Visit Date: 12/10/2021   Chief Complaint  Patient presents with   Follow-up   Subjective    HPI  Patient having significant pain of skin of back after cosmetic surgery.  -started keflex TID for 10 days.  -started gabapentin to titrate dosing up to TID as needed for burning pain/neuropathy  -ibuprofen 800mg  up to tid as needed for pain.  -did go to ER the day after her visit here. A CT abdomen and pelvis was performed confirmed diagnosis of possible cellulitis and lymphedema. She was told to continue on keflex until prescription was complete.  -she was given an IV dose of toradol which she found to be very helpful with pain control.  -she reports some, but little, improvement since her last visit with me.    Medications: Outpatient Medications Prior to Visit  Medication Sig   fluconazole (DIFLUCAN) 150 MG tablet Take 1 tablet po once. May repeat dose in 3 days as needed for persistent symptoms.   gabapentin (NEURONTIN) 100 MG capsule Take 1 cap po QPM prn. May gradually titrate to Tid prn pain   ibuprofen (ADVIL) 800 MG tablet Take 1 tablet (800 mg total) by mouth every 8 (eight) hours as needed.   [DISCONTINUED] cephALEXin (KEFLEX) 500 MG capsule Take 1 capsule (500 mg total) by mouth 3 (three) times daily.   Facility-Administered Medications Prior to Visit  Medication Dose Route Frequency Provider   medroxyPROGESTERone (DEPO-PROVERA) injection 150 mg  150 mg Intramuscular Q90 days Denney, Rachelle A, CNM    Review of Systems  Constitutional:  Positive for fatigue. Negative for activity change, appetite change, chills and fever.       Patient having a difficult time going from lying to sitting and sitting to standing due to the pain she feels in the skin of her back.  This has improved from her recent visit with me.   HENT:  Negative for congestion, postnasal drip, rhinorrhea,  sinus pressure, sinus pain, sneezing and sore throat.   Eyes: Negative.   Respiratory:  Negative for cough, chest tightness, shortness of breath and wheezing.   Cardiovascular:  Negative for chest pain and palpitations.  Gastrointestinal:  Negative for abdominal pain, constipation, diarrhea, nausea and vomiting.  Endocrine: Negative for cold intolerance, heat intolerance, polydipsia and polyuria.  Genitourinary:  Negative for dyspareunia, dysuria, flank pain, frequency and urgency.  Musculoskeletal:  Positive for arthralgias and back pain. Negative for myalgias.  Skin:  Negative for rash.       Persistent heat and pain, especially of her lower back. Only slightly improved since her last visit here.   Allergic/Immunologic: Negative for environmental allergies.  Neurological:  Negative for dizziness, weakness and headaches.  Hematological:  Negative for adenopathy.  Psychiatric/Behavioral:  The patient is not nervous/anxious.       Objective     Today's Vitals   12/10/21 1352  BP: 119/79  Pulse: 74  Temp: 97.6 F (36.4 C)  SpO2: 100%  Weight: 176 lb 1.9 oz (79.9 kg)  Height: 5' 6.14" (1.68 m)   Body mass index is 28.3 kg/m.   BP Readings from Last 3 Encounters:  12/10/21 119/79  12/03/21 129/82  11/06/21 102/70    Wt Readings from Last 3 Encounters:  12/10/21 176 lb 1.9 oz (79.9 kg)  12/03/21 177 lb (80.3 kg)  11/06/21 174 lb (78.9 kg)    Physical Exam  Vitals and nursing note reviewed.  Constitutional:      Appearance: Normal appearance. She is well-developed.     Comments: Patient appears to be slightly more comfortable today than at her last visit.   HENT:     Head: Normocephalic.     Nose: Nose normal.     Mouth/Throat:     Mouth: Mucous membranes are moist.     Pharynx: Oropharynx is clear.  Eyes:     Extraocular Movements: Extraocular movements intact.     Conjunctiva/sclera: Conjunctivae normal.     Pupils: Pupils are equal, round, and reactive to light.   Cardiovascular:     Rate and Rhythm: Normal rate and regular rhythm.     Pulses: Normal pulses.     Heart sounds: Normal heart sounds.  Pulmonary:     Effort: Pulmonary effort is normal.     Breath sounds: Normal breath sounds.  Abdominal:     General: A surgical scar is present. Bowel sounds are normal. There is distension.     Palpations: Abdomen is soft.     Tenderness: There is generalized abdominal tenderness.     Comments: There are pockets of subcutaneous fluid under surface of the skin. Abdomen is now non tender.   Musculoskeletal:        General: Normal range of motion.     Cervical back: Normal range of motion and neck supple.  Lymphadenopathy:     Cervical: No cervical adenopathy.  Skin:    General: Skin is warm and dry.     Capillary Refill: Capillary refill takes less than 2 seconds.     Comments: The skin of the back is slightly warm to palpate. No redness or focal areas of infection are noted at this time. Incisions have healed well. No rash. No abscess noted. No pockets of fluid noted at this time.   Neurological:     General: No focal deficit present.     Mental Status: She is alert and oriented to person, place, and time.  Psychiatric:        Mood and Affect: Mood normal.        Behavior: Behavior normal.        Thought Content: Thought content normal.        Judgment: Judgment normal.      Assessment & Plan    1. Cellulitis of back except buttock Reviewed CT abdomen and pelvis, done at ER in [place]. This confirmed diagnosis of cellulitis, based on symptoms. Extend treatment of cephalexin 500mg  TID for additional 5 days. She should continue to take until prescription is entirely gone. She should contact the office for symptoms which worsen or do not improve over next 1 to 2 weeks.  - cephALEXin (KEFLEX) 500 MG capsule; Take 1 capsule (500 mg total) by mouth 3 (three) times daily.  Dispense: 15 capsule; Refill: 0  2. Neuropathic pain Patient may take  toradol 10mg  up to three times daily as needed for next few days. Advised her to hold ibuprofen if taking toradol. She can take gabapentin at night. She should avoid taking this in the day, as she knows, after taking it once, that it does make her sleepy.  - ketorolac (TORADOL) 10 MG tablet; Take 1 tablet (10 mg total) by mouth every 6 (six) hours as needed.  Dispense: 21 tablet; Refill: 0    Problem List Items Addressed This Visit       Musculoskeletal and Integument   Cellulitis of back  except buttock - Primary   Relevant Medications   cephALEXin (KEFLEX) 500 MG capsule     Other   Neuropathic pain   Relevant Medications   ketorolac (TORADOL) 10 MG tablet     Return for prn worsening or persistent symptoms.         Carlean Jews, NP  [facility] [phone] (phone) [phone] (fax)  [facility]

## 2022-01-22 ENCOUNTER — Other Ambulatory Visit: Payer: Self-pay | Admitting: Obstetrics and Gynecology

## 2022-01-22 ENCOUNTER — Other Ambulatory Visit: Payer: Self-pay | Admitting: Nurse Practitioner

## 2022-01-22 DIAGNOSIS — M792 Neuralgia and neuritis, unspecified: Secondary | ICD-10-CM

## 2022-01-22 DIAGNOSIS — Z3042 Encounter for surveillance of injectable contraceptive: Secondary | ICD-10-CM

## 2022-01-24 ENCOUNTER — Other Ambulatory Visit: Payer: Self-pay

## 2022-01-24 MED ORDER — MEDROXYPROGESTERONE ACETATE 150 MG/ML IM SUSP: 150.0000 mg | INTRAMUSCULAR | 0 refills | Status: DC

## 2022-01-27 ENCOUNTER — Ambulatory Visit (INDEPENDENT_AMBULATORY_CARE_PROVIDER_SITE_OTHER): Payer: 59

## 2022-01-27 DIAGNOSIS — Z3042 Encounter for surveillance of injectable contraceptive: Secondary | ICD-10-CM

## 2022-01-27 MED ORDER — MEDROXYPROGESTERONE ACETATE 150 MG/ML IM SUSP: 150.0000 mg | INTRAMUSCULAR | 2 refills | Status: DC

## 2022-01-27 NOTE — Progress Notes (Signed)
Pt is in the office for depo injection. Administered in LD and pt tolerated well. Next due Oct 2-16. Last annual was 08/15/21, needs depo refills. .. Administrations This Visit     medroxyPROGESTERone (DEPO-PROVERA) injection 150 mg     Admin Date 01/27/2022 Action Given Dose 150 mg Route Intramuscular Administered By Katrina Stack, RN

## 2022-03-12 ENCOUNTER — Telehealth: Payer: Self-pay

## 2022-03-12 NOTE — Telephone Encounter (Signed)
Returned call and pt reports abnormal urinary symptoms and possible infection. Transferred to scheduler for appt.

## 2022-03-13 ENCOUNTER — Ambulatory Visit (INDEPENDENT_AMBULATORY_CARE_PROVIDER_SITE_OTHER): Payer: 59 | Admitting: Emergency Medicine

## 2022-03-13 VITALS — BP 119/81 | HR 77 | Ht 67.0 in | Wt 179.5 lb

## 2022-03-13 DIAGNOSIS — R300 Dysuria: Secondary | ICD-10-CM

## 2022-03-13 LAB — POCT URINALYSIS DIPSTICK OB
Bilirubin, UA: NEGATIVE
Glucose, UA: NEGATIVE
Ketones, UA: NEGATIVE
Leukocytes, UA: NEGATIVE
Nitrite, UA: NEGATIVE
POC,PROTEIN,UA: NEGATIVE
Spec Grav, UA: 1.01 (ref 1.010–1.025)
Urobilinogen, UA: 0.2 E.U./dL
pH, UA: 6 (ref 5.0–8.0)

## 2022-03-13 MED ORDER — FLUCONAZOLE 150 MG PO TABS: 150.0000 mg | ORAL_TABLET | Freq: Once | ORAL | 0 refills | Status: AC

## 2022-03-13 MED ORDER — NITROFURANTOIN MONOHYD MACRO 100 MG PO CAPS: 100.0000 mg | ORAL_CAPSULE | Freq: Two times a day (BID) | ORAL | 0 refills | Status: DC

## 2022-03-13 NOTE — Progress Notes (Signed)
SUBJECTIVE: Amy Davies is a 36 y.o. female who complains of urinary frequency, urgency and dysuria x 3 days, without flank pain, fever, chills, or abnormal vaginal discharge or bleeding.    OBJECTIVE: Appears well, in no apparent distress.  Vital signs are normal. Urine dipstick shows positive for RBC's.    ASSESSMENT: Dysuria  PLAN: Treatment per orders.  Call or return to clinic prn if these symptoms worsen or fail to improve as anticipated.   Urine sent for culture. Diflucan for vaginal yeast symptoms with antibiotic use.

## 2022-03-14 ENCOUNTER — Ambulatory Visit: Payer: Medicaid Other | Admitting: Nurse Practitioner

## 2022-03-26 ENCOUNTER — Other Ambulatory Visit: Payer: Self-pay | Admitting: Emergency Medicine

## 2022-03-26 MED ORDER — FLUCONAZOLE 150 MG PO TABS: 150.0000 mg | ORAL_TABLET | Freq: Once | ORAL | 0 refills | Status: AC

## 2022-03-26 NOTE — Progress Notes (Signed)
Pt has yeast symptoms with abx course. Diflucan sent per protocol.

## 2022-04-07 ENCOUNTER — Other Ambulatory Visit: Payer: Self-pay | Admitting: Obstetrics and Gynecology

## 2022-04-07 DIAGNOSIS — B379 Candidiasis, unspecified: Secondary | ICD-10-CM

## 2022-04-23 ENCOUNTER — Ambulatory Visit: Payer: 59

## 2022-04-28 ENCOUNTER — Ambulatory Visit (INDEPENDENT_AMBULATORY_CARE_PROVIDER_SITE_OTHER): Payer: 59 | Admitting: General Practice

## 2022-04-28 DIAGNOSIS — Z3042 Encounter for surveillance of injectable contraceptive: Secondary | ICD-10-CM | POA: Diagnosis not present

## 2022-04-28 MED ORDER — FLUCONAZOLE 150 MG PO TABS: 150.0000 mg | ORAL_TABLET | Freq: Once | ORAL | 0 refills | Status: AC

## 2022-04-28 MED ORDER — MEDROXYPROGESTERONE ACETATE 150 MG/ML IM SUSP
150.0000 mg | Freq: Once | INTRAMUSCULAR | Status: AC
Start: 2022-04-28 — End: 2022-04-28
  Administered 2022-04-28: 150 mg via INTRAMUSCULAR

## 2022-04-28 NOTE — Progress Notes (Signed)
Date last pap: 08-16-21. Last Depo-Provera: 01-27-22. Side Effects if any: Pt tolerated well. Serum HCG indicated? Depo given on schedule. Depo-Provera 150 mg IM given by Arlie Solomons, CMA in the right deltoid. Next appointment due 1/1-1/15.   Pt c/o vaginal irritation. Diflucan sent per protocol.

## 2022-07-21 ENCOUNTER — Ambulatory Visit (INDEPENDENT_AMBULATORY_CARE_PROVIDER_SITE_OTHER): Payer: Self-pay

## 2022-07-21 VITALS — BP 121/79 | HR 83 | Wt 180.0 lb

## 2022-07-21 DIAGNOSIS — R3915 Urgency of urination: Secondary | ICD-10-CM

## 2022-07-21 DIAGNOSIS — Z3042 Encounter for surveillance of injectable contraceptive: Secondary | ICD-10-CM

## 2022-07-21 DIAGNOSIS — N3001 Acute cystitis with hematuria: Secondary | ICD-10-CM

## 2022-07-21 LAB — POCT URINALYSIS DIPSTICK
Bilirubin, UA: NEGATIVE
Blood, UA: POSITIVE
Glucose, UA: NEGATIVE
Ketones, UA: POSITIVE
Nitrite, UA: NEGATIVE
Protein, UA: NEGATIVE
Spec Grav, UA: 1.01 (ref 1.010–1.025)
Urobilinogen, UA: 0.2 E.U./dL
pH, UA: 6 (ref 5.0–8.0)

## 2022-07-21 MED ORDER — NITROFURANTOIN MONOHYD MACRO 100 MG PO CAPS: 100.0000 mg | ORAL_CAPSULE | Freq: Two times a day (BID) | ORAL | 0 refills | Status: DC

## 2022-07-21 MED ORDER — MEDROXYPROGESTERONE ACETATE 150 MG/ML IM SUSP
150.0000 mg | Freq: Once | INTRAMUSCULAR | Status: AC
  Administered 2022-07-21: 150 mg via INTRAMUSCULAR

## 2022-07-21 MED ORDER — FLUCONAZOLE 150 MG PO TABS: 150.0000 mg | ORAL_TABLET | Freq: Once | ORAL | 0 refills | Status: AC

## 2022-07-21 NOTE — Progress Notes (Addendum)
Date last pap: 08/16/21. Last Depo-Provera: 04/28/22. Side Effects if any: patient tolerated injection. Serum HCG indicated? Depo given on schedule. Depo-Provera 150 mg IM given by: Lateshia Schmoker D, LPN in right deltoid. Next appointment due 3/26 - 4/9.   Patient requesting urine culture. Pt reports having urinary frequency and odor. Denies any pain. Macrobid and Diflucan sent in per Dr. Elly Modena.

## 2022-07-23 LAB — URINE CULTURE

## 2022-08-06 ENCOUNTER — Institutional Professional Consult (permissible substitution): Payer: Medicaid Other | Admitting: Obstetrics and Gynecology

## 2022-10-13 ENCOUNTER — Ambulatory Visit (INDEPENDENT_AMBULATORY_CARE_PROVIDER_SITE_OTHER): Payer: BLUE CROSS/BLUE SHIELD

## 2022-10-13 ENCOUNTER — Ambulatory Visit: Payer: BLUE CROSS/BLUE SHIELD

## 2022-10-13 VITALS — BP 118/73 | HR 80 | Wt 180.0 lb

## 2022-10-13 DIAGNOSIS — Z3042 Encounter for surveillance of injectable contraceptive: Secondary | ICD-10-CM | POA: Diagnosis not present

## 2022-10-13 NOTE — Progress Notes (Signed)
Subjective: Pt in for Depo Provera injection.    Objective: Need for contraception. No unusual complaints.    Assessment: Depo given L Deltoid. Pt tolerated Depo injection.   Plan:  Next injection due June 17-July 1st.

## 2022-12-30 ENCOUNTER — Ambulatory Visit: Payer: BLUE CROSS/BLUE SHIELD

## 2023-03-30 ENCOUNTER — Other Ambulatory Visit: Payer: Self-pay | Admitting: Obstetrics & Gynecology

## 2023-04-01 ENCOUNTER — Ambulatory Visit: Payer: Commercial Managed Care - PPO | Admitting: Emergency Medicine

## 2023-04-01 VITALS — BP 121/80 | HR 78 | Wt 186.1 lb

## 2023-04-01 DIAGNOSIS — Z3042 Encounter for surveillance of injectable contraceptive: Secondary | ICD-10-CM

## 2023-04-01 MED ORDER — MEDROXYPROGESTERONE ACETATE 150 MG/ML IM SUSP
150.0000 mg | Freq: Once | INTRAMUSCULAR | Status: AC
  Administered 2023-04-01: 150 mg via INTRAMUSCULAR

## 2023-04-01 NOTE — Progress Notes (Addendum)
Date last pap: 08/16/2021 Last Depo-Provera:  12/26/2023 Side Effects if any: None. Serum HCG indicated? Urine HCG, 5 days post window. Depo-Provera 150 mg IM given by: Leavy Cella, RN into LD, tolerated well.  Next appointment due: Dec 4- Dec 18

## 2023-06-25 ENCOUNTER — Ambulatory Visit: Payer: Commercial Managed Care - PPO | Admitting: *Deleted

## 2023-06-25 VITALS — Wt 188.0 lb

## 2023-06-25 DIAGNOSIS — R3915 Urgency of urination: Secondary | ICD-10-CM

## 2023-06-25 DIAGNOSIS — Z3042 Encounter for surveillance of injectable contraceptive: Secondary | ICD-10-CM | POA: Diagnosis not present

## 2023-06-25 LAB — POCT URINALYSIS DIPSTICK
Bilirubin, UA: NEGATIVE
Glucose, UA: NEGATIVE
Ketones, UA: NEGATIVE
Leukocytes, UA: NEGATIVE
Nitrite, UA: NEGATIVE
Protein, UA: POSITIVE — AB
Spec Grav, UA: 1.015 (ref 1.010–1.025)
Urobilinogen, UA: 0.2 U/dL
pH, UA: 6.5 (ref 5.0–8.0)

## 2023-06-25 NOTE — Progress Notes (Signed)
Date last pap: 08/16/21. Last Depo-Provera: 04/01/23. Side Effects if any: none. Serum HCG indicated? N/A. Depo-Provera 150 mg IM given by: Mercy Riding, CMA.  Next appointment due 2/27-3/13/25.  Pt also complains of urinary frequency, would like uti check today. Udip shows RBC's and Protein.  UC will be sent to lab. Pt will be treated based on results   Administrations This Visit     medroxyPROGESTERone (DEPO-PROVERA) injection 150 mg     Admin Date 06/25/2023 Action Given Dose 150 mg Route Intramuscular Documented By Lanney Gins, CMA

## 2023-06-27 LAB — URINE CULTURE

## 2023-06-30 ENCOUNTER — Ambulatory Visit: Payer: Commercial Managed Care - PPO

## 2023-09-22 ENCOUNTER — Ambulatory Visit

## 2023-09-22 VITALS — BP 112/76 | HR 78

## 2023-09-22 DIAGNOSIS — Z3042 Encounter for surveillance of injectable contraceptive: Secondary | ICD-10-CM | POA: Diagnosis not present

## 2023-09-22 MED ORDER — MEDROXYPROGESTERONE ACETATE 150 MG/ML IM SUSP
150.0000 mg | Freq: Once | INTRAMUSCULAR | Status: AC
  Administered 2023-09-22: 150 mg via INTRAMUSCULAR

## 2023-09-22 NOTE — Progress Notes (Signed)
 Date last pap: 08/16/21. Last Depo-Provera: 06/25/23. Side Effects if any: NA. Serum HCG indicated? NA. Depo-Provera 150 mg IM given by: Karma Ganja, RN. Next appointment due May 27th - June 10th.

## 2023-10-22 ENCOUNTER — Ambulatory Visit: Payer: Self-pay | Admitting: *Deleted

## 2023-10-22 ENCOUNTER — Ambulatory Visit (INDEPENDENT_AMBULATORY_CARE_PROVIDER_SITE_OTHER): Admitting: Family Medicine

## 2023-10-22 ENCOUNTER — Encounter: Payer: Self-pay | Admitting: Family Medicine

## 2023-10-22 VITALS — BP 112/80 | HR 84 | Ht 67.0 in | Wt 186.0 lb

## 2023-10-22 DIAGNOSIS — L309 Dermatitis, unspecified: Secondary | ICD-10-CM | POA: Insufficient documentation

## 2023-10-22 MED ORDER — TRIAMCINOLONE ACETONIDE 0.1 % EX CREA: 1.0000 | TOPICAL_CREAM | Freq: Two times a day (BID) | CUTANEOUS | 1 refills | Status: AC

## 2023-10-22 NOTE — Telephone Encounter (Signed)
  Chief Complaint: rash Symptoms: pimple type rash- face, neck chest-itching Frequency: 1 1/2 weeks Pertinent Negatives: Patient denies fever, pain Disposition: [] ED /[] Urgent Care (no appt availability in office) / [x] Appointment(In office/virtual)/ []  [facility]/ [] Home Care/ [] Refused Recommended Disposition /[] [facility]/ []  Follow-up with PCP Additional Notes: Patient has been scheduled- will bring updated insurance information   Copied from CRM [id number]. Topic: Clinical - Red Word Triage >> Oct 22, 2023  8:17 AM Ivette P wrote: [facility] that prompted transfer to Nurse Triage: Allergic reaction in face and neck Reason for Disposition  Localized rash present > 7 days  Answer Assessment - Initial Assessment Questions 1. APPEARANCE of RASH: "Describe the rash."      Small pimple bumps 2. LOCATION: "Where is the rash located?"      Face, neck, chest 3. NUMBER: "How many spots are there?"      Multiple bumps 4. SIZE: "How big are the spots?" (Inches, centimeters or compare to size of a coin)      Size of mosquito bite  5. ONSET: "When did the rash start?"      1-1/2 weeks ago 6. ITCHING: "Does the rash itch?" If Yes, ask: "How bad is the itch?"  (Scale 0-10; or none, mild, moderate, severe)     Yes- periodic 4-5/10 7. PAIN: "Does the rash hurt?" If Yes, ask: "How bad is the pain?"  (Scale 0-10; or none, mild, moderate, severe)    - NONE (0): no pain    - MILD (1-3): doesn't interfere with normal activities     - MODERATE (4-7): interferes with normal activities or awakens from sleep     - SEVERE (8-10): excruciating pain, unable to do any normal activities     no 8. OTHER SYMPTOMS: "Do you have any other symptoms?" (e.g., fever)     no  Protocols used: Rash or Redness - Localized-A-AH

## 2023-10-22 NOTE — Progress Notes (Signed)
   Acute Office Visit  Subjective:     Patient ID: Amy Davies, female    DOB: 26-May-1986, 38 y.o.   MRN: [id number]  Chief Complaint  Patient presents with   Rash    HPI  Patient is here today for a rash on her face and neck.  It has been present for the last 1-1/2 weeks.  It is pruritic.  It has not improved.  She has been taking allergy medication even prior to this for presumed allergies.  She has not tried any new medications, no new lotions or soaps or detergents.  Nobody around her has come down with similar symptoms.  She tried hydrocortisone cream last night but otherwise has not put anything else on it.  No history of asthma or autoimmune disease.  No muscle aches or joint aches.  She has not been near any trees or wooded areas, has not spent a significant or excessive amount in sunlight.    ROS      Objective:    BP 112/80   Pulse 84   Ht 5\' 7"  (1.702 m)   Wt 186 lb (84.4 kg)   SpO2 100%   BMI 29.13 kg/m    Physical Exam General: Alert, oriented Pulmonary: No respiratory distress Skin: Scattered small papular lesions on the cheeks and forehead as well as neck.  They are varying sizes.  No erythema noted.  No results found for any visits on 10/22/23.      Assessment & Plan:   Dermatitis Assessment & Plan: Scattered papular lesions on the forehead and cheeks and neck.  They are pruritic.  1.5 weeks with no improvement.  Will prescribed triamcinolone cream twice daily for 2 weeks.  If not improved we will refer to dermatology where they can evaluate and biopsy if needed.   Other orders -     Triamcinolone Acetonide; Apply 1 Application topically 2 (two) times daily. For 2 weeeks  Dispense: 45 g; Refill: 1     Return if symptoms worsen or fail to improve.  Sandre Kitty, MD

## 2023-10-22 NOTE — Patient Instructions (Signed)
 It was nice to see you today,  We addressed the following topics today: -You can take Allegra, Claritin or Zyrtec during the day.  If you need additional itch relief with an oral antihistamine you can take Benadryl at night for an additional dose of Claritin or Zyrtec at night. - I am sending in a topical steroid for you to use on your face.  Use it for the next 2 weeks, twice a day.  If it is not getting better by then let us know.  If he gets worse before then let us know sooner. - You can also use over-the-counter nonsteroidal antiitch creams like Sarna as needed.  Have a great day,  Frederic Jericho, MD

## 2023-10-22 NOTE — Assessment & Plan Note (Signed)
 Scattered papular lesions on the forehead and cheeks and neck.  They are pruritic.  1.5 weeks with no improvement.  Will prescribed triamcinolone cream twice daily for 2 weeks.  If not improved we will refer to dermatology where they can evaluate and biopsy if needed.

## 2023-11-09 ENCOUNTER — Ambulatory Visit
Admission: RE | Admit: 2023-11-09 | Discharge: 2023-11-09 | Disposition: A | Discharge status: Home / Self Care | Source: Ambulatory Visit | Attending: Physician Assistant | Admitting: Physician Assistant

## 2023-11-09 ENCOUNTER — Other Ambulatory Visit: Payer: Self-pay

## 2023-11-09 VITALS — BP 117/80 | HR 86 | Temp 98.1°F | Resp 18 | Ht 67.0 in | Wt 180.0 lb

## 2023-11-09 DIAGNOSIS — H6123 Impacted cerumen, bilateral: Secondary | ICD-10-CM

## 2023-11-09 DIAGNOSIS — R42 Dizziness and giddiness: Secondary | ICD-10-CM

## 2023-11-09 NOTE — ED Provider Notes (Signed)
 [facility]    CSN: [id number] Arrival date & time: 11/09/23  4098      History   Chief Complaint Chief Complaint  Patient presents with   Ear Fullness    Entered by patient    HPI Amy Davies is a 38 y.o. female.   HPI  She reports having some left ear fullness, lightheadedness, itchy throat She also reports postnasal drainage  She states she took an allergy pill  last night which slightly helped with symptoms  She tried to clean her ear out but this did not improve the ear fullness symptoms   She denies recent sick contacts    History reviewed. No pertinent past medical history.  Patient Active Problem List   Diagnosis Date Noted   Dermatitis 10/22/2023   Cellulitis of back except buttock 12/10/2021   Neuropathic pain 12/10/2021   Abnormal ECG 09/03/2021   Body mass index 28.0-28.9, adult 08/26/2021    Past Surgical History:  Procedure Laterality Date   CESAREAN SECTION N/A 09/06/2015   Procedure: CESAREAN SECTION;  Surgeon: Gabrielle Joiner, MD;  Location: [facility];  Service: Obstetrics;  Laterality: N/A;   INDUCED ABORTION      OB History     Gravida  6   Para  3   Term  2   Preterm  1   AB  3   Living  3      SAB  1   IAB  2   Ectopic  0   Multiple  0   Live Births  3            Home Medications    Prior to Admission medications   Medication Sig Start Date End Date Taking? Authorizing Provider  medroxyPROGESTERone  Acetate 150 MG/ML SUSY INJECT 1ML (150MG ) INTO THE MUSCLE EVERY 3 MONTHS 03/31/23   Anyanwu, Ugonna A, MD  triamcinolone  cream (KENALOG ) 0.1 % Apply 1 Application topically 2 (two) times daily. For 2 weeeks 10/22/23   Laneta Pintos, MD  azelastine  (ASTELIN ) 0.1 % nasal spray Place 2 sprays into both nostrils 2 (two) times daily. Patient not taking: No sig reported 08/30/19 09/24/20  Alfrieda Antes, PA-C    Family History Family History  Problem Relation Age of Onset   Asthma Neg Hx    Diabetes Neg Hx     Heart disease Neg Hx    Hypertension Neg Hx    Kidney disease Neg Hx    Stroke Neg Hx    Hearing loss Neg Hx     Social History Social History   Tobacco Use   Smoking status: Some Days    Types: Cigars    Passive exposure: Never   Smokeless tobacco: Never   Tobacco comments:    July 2016  Vaping Use   Vaping status: Never Used  Substance Use Topics   Alcohol use: Yes    Alcohol/week: 0.0 standard drinks of alcohol    Comment: socially; not since +preg   Drug use: Not Currently    Types: Marijuana    Comment: last was Aug 07/2014     Allergies   Patient has no known allergies.   Review of Systems Review of Systems  Constitutional:  Positive for diaphoresis. Negative for chills and fever.  HENT:  Positive for postnasal drip.        Ear fullness Itchy throat    Respiratory:  Negative for choking, shortness of breath and wheezing.   Neurological:  Positive  for light-headedness.     Physical Exam Triage Vital Signs ED Triage Vitals  Encounter Vitals Group     BP 11/09/23 1007 117/80     Systolic BP Percentile --      Diastolic BP Percentile --      Pulse Rate 11/09/23 1007 86     Resp 11/09/23 1007 18     Temp 11/09/23 1007 98.1 F (36.7 C)     Temp Source 11/09/23 1007 Oral     SpO2 11/09/23 1007 99 %     Weight 11/09/23 1006 180 lb (81.6 kg)     Height 11/09/23 1006 5\' 7"  (1.702 m)     Head Circumference --      Peak Flow --      Pain Score 11/09/23 1006 4     Pain Loc --      Pain Education --      Exclude from Growth Chart --    No data found.  Updated Vital Signs BP 117/80 (BP Location: Right Arm)   Pulse 86   Temp 98.1 F (36.7 C) (Oral)   Resp 18   Ht 5\' 7"  (1.702 m)   Wt 180 lb (81.6 kg)   SpO2 99%   BMI 28.19 kg/m   Visual Acuity Right Eye Distance:   Left Eye Distance:   Bilateral Distance:    Right Eye Near:   Left Eye Near:    Bilateral Near:     Physical Exam Vitals reviewed.  Constitutional:      Appearance:  Normal appearance.  HENT:     Head: Normocephalic and atraumatic.     Right Ear: Hearing, tympanic membrane and ear canal normal. There is impacted cerumen.     Left Ear: Hearing, tympanic membrane and ear canal normal. There is impacted cerumen.     Ears:     Comments: Bilateral tympanic membranes are normal to exam after ear lavage.  No signs of canal inflammation, swelling, drainage or discharge.    Mouth/Throat:     Lips: Pink.     Mouth: Mucous membranes are moist.     Pharynx: Oropharynx is clear. Uvula midline. No pharyngeal swelling, oropharyngeal exudate, posterior oropharyngeal erythema, uvula swelling or postnasal drip.     Tonsils: No tonsillar exudate or tonsillar abscesses. 0 on the right. 0 on the left.  Pulmonary:     Effort: Pulmonary effort is normal.     Breath sounds: Normal breath sounds. No decreased air movement. No decreased breath sounds, wheezing, rhonchi or rales.  Musculoskeletal:     Cervical back: Normal range of motion and neck supple.  Lymphadenopathy:     Head:     Right side of head: No submental, submandibular or preauricular adenopathy.     Left side of head: No submental, submandibular or preauricular adenopathy.     Cervical:     Right cervical: No superficial cervical adenopathy.    Left cervical: No superficial cervical adenopathy.     Upper Body:     Right upper body: No supraclavicular adenopathy.     Left upper body: No supraclavicular adenopathy.  Neurological:     Mental Status: She is alert.  Psychiatric:        Attention and Perception: Attention and perception normal.        Mood and Affect: Mood and affect normal.        Speech: Speech normal.        Behavior: Behavior normal. Behavior is cooperative.  Thought Content: Thought content normal.      UC Treatments / Results  Labs (all labs ordered are listed, but only abnormal results are displayed) Labs Reviewed - No data to display  EKG   Radiology No results  found.  Procedures Procedures (including critical care time)  Medications Ordered in UC Medications - No data to display  Initial Impression / Assessment and Plan / UC Course  I have reviewed the triage vital signs and the nursing notes.  Pertinent labs & imaging results that were available during my care of the patient were reviewed by me and considered in my medical decision making (see chart for details).      Final Clinical Impressions(s) / UC Diagnoses   Final diagnoses:  Bilateral impacted cerumen  Lightheaded   Patient presents today with concerns for left-sided ear fullness and mild lightheadedness.  She reports this been ongoing since last night and she took an allergy medication to assist with symptoms.  Physical exam is overall reassuring and I suspect that symptoms are likely due to bilateral impacted cerumen which was removed with ear lavage.  Tympanic membranes are intact, pearly and semiopaque bilaterally with no signs of perforation, swelling, purulence or infection.  Recommend continued use of over-the-counter antihistamine and Flonase  to assist with sinus symptoms but this will also help with eustachian tube drainage.  If symptoms are not improving over the next 2 to 3 weeks recommend follow-up with primary care provider for ongoing management.  Follow-up as needed    Discharge Instructions      You were seen today for concerns of ear fullness and lightheadedness.  We were able to remove earwax from both ears and your eardrums did not show signs of an infection or perforation.  At this time I suspect that your symptoms are likely due to the earwax buildup which should start to resolve now that we have removed it.  I recommend you continue taking your allergy medication and add Flonase  to assist with seasonal allergy symptoms which could be exacerbating your ear fullness and causing some of the lightheadedness.  I also recommend adding an antihistamine to your daily  regimen This includes medications like Claritin, Allegra, Zyrtec- the generics of these work very well and are usually less expensive I recommend using Flonase  nasal spray - 2 puffs twice per day to help with your nasal congestion The antihistamines and Flonase  can take a few weeks to provide significant relief from allergy symptoms but should start to provide some benefit soon.      ED Prescriptions   None    PDMP not reviewed this encounter.   Jerona Mooring, PA-C 11/09/23 1130

## 2023-11-09 NOTE — Discharge Instructions (Addendum)
 You were seen today for concerns of ear fullness and lightheadedness.  We were able to remove earwax from both ears and your eardrums did not show signs of an infection or perforation.  At this time I suspect that your symptoms are likely due to the earwax buildup which should start to resolve now that we have removed it.  I recommend you continue taking your allergy medication and add Flonase  to assist with seasonal allergy symptoms which could be exacerbating your ear fullness and causing some of the lightheadedness.  I also recommend adding an antihistamine to your daily regimen This includes medications like Claritin, Allegra, Zyrtec- the generics of these work very well and are usually less expensive I recommend using Flonase  nasal spray - 2 puffs twice per day to help with your nasal congestion The antihistamines and Flonase  can take a few weeks to provide significant relief from allergy symptoms but should start to provide some benefit soon.

## 2023-11-09 NOTE — ED Triage Notes (Addendum)
 Pt presents with complaints of left ear fullness that began last night, 4/27. Pt states she feels like an aching sensation in left ear. Feels off balance and lightheaded as well. Pt currently rates her overall pain a 4/10. OTC Zyrtec taken last night with no improvement.   Gait is steady to triage. Pt also mentions itchiness in throat. This is an ongoing issue. Believes it is due to allergies.

## 2023-12-15 ENCOUNTER — Ambulatory Visit

## 2023-12-21 ENCOUNTER — Ambulatory Visit

## 2023-12-21 ENCOUNTER — Other Ambulatory Visit: Payer: Self-pay

## 2023-12-21 VITALS — BP 125/85 | HR 99

## 2023-12-21 DIAGNOSIS — Z3042 Encounter for surveillance of injectable contraceptive: Secondary | ICD-10-CM | POA: Diagnosis not present

## 2023-12-21 DIAGNOSIS — N390 Urinary tract infection, site not specified: Secondary | ICD-10-CM

## 2023-12-21 LAB — POCT URINALYSIS DIPSTICK
Glucose, UA: NEGATIVE
Ketones, UA: NEGATIVE
Nitrite, UA: NEGATIVE
Protein, UA: POSITIVE — AB
Spec Grav, UA: 1.015
Urobilinogen, UA: 0.2 U/dL
pH, UA: 7

## 2023-12-21 MED ORDER — NITROFURANTOIN MONOHYD MACRO 100 MG PO CAPS: 100.0000 mg | ORAL_CAPSULE | Freq: Two times a day (BID) | ORAL | 0 refills | Status: DC

## 2023-12-21 MED ORDER — MEDROXYPROGESTERONE ACETATE 150 MG/ML IM SUSP
150.0000 mg | Freq: Once | INTRAMUSCULAR | Status: AC
  Administered 2023-12-21: 150 mg via INTRAMUSCULAR

## 2023-12-21 NOTE — Addendum Note (Signed)
 Addended by: Marita Sidle on: 12/21/2023 10:16 AM   Modules accepted: Orders

## 2023-12-21 NOTE — Progress Notes (Signed)
 Date last pap: 08/16/21. Last Depo-Provera : 09/22/23. Side Effects if any: N/A. Serum HCG indicated? N/A. Depo-Provera  150 mg IM given by: Claudio Culver, RN in LD. Next appointment due 03/07/24-03/21/24.   Pt suspects UTI. Reports odor to urine. Urine sample obtained. Advised she will be notified of results.

## 2023-12-23 ENCOUNTER — Other Ambulatory Visit: Payer: Self-pay

## 2023-12-23 DIAGNOSIS — B379 Candidiasis, unspecified: Secondary | ICD-10-CM

## 2023-12-23 LAB — URINE CULTURE

## 2023-12-23 MED ORDER — FLUCONAZOLE 150 MG PO TABS: 150.0000 mg | ORAL_TABLET | Freq: Once | ORAL | 0 refills | Status: AC

## 2024-03-10 ENCOUNTER — Ambulatory Visit

## 2024-03-16 ENCOUNTER — Ambulatory Visit

## 2024-03-16 VITALS — BP 112/74 | HR 84

## 2024-03-16 DIAGNOSIS — Z3042 Encounter for surveillance of injectable contraceptive: Secondary | ICD-10-CM

## 2024-03-16 NOTE — Progress Notes (Signed)
 Pt is in the office for depo injection. Administered in [place] and pt tolerated well. Next due Nov 19- Dec 3. Pt states that she may call later to schedule appt for a BTL consultation. .. Administrations This Visit     medroxyPROGESTERone  (DEPO-PROVERA ) injection 150 mg     Admin Date 03/16/2024 Action Given Dose 150 mg Route Intramuscular Documented By Doneta Laymon BIRCH, RN

## 2024-06-06 ENCOUNTER — Encounter: Payer: Self-pay | Admitting: Obstetrics and Gynecology

## 2024-06-06 ENCOUNTER — Other Ambulatory Visit: Payer: Self-pay | Admitting: Obstetrics & Gynecology

## 2024-06-06 ENCOUNTER — Other Ambulatory Visit: Payer: Self-pay

## 2024-06-06 MED ORDER — MEDROXYPROGESTERONE ACETATE 150 MG/ML IM SUSY: 150.0000 mg | PREFILLED_SYRINGE | INTRAMUSCULAR | 0 refills | Status: AC

## 2024-06-08 ENCOUNTER — Ambulatory Visit

## 2024-06-08 VITALS — BP 114/74 | HR 84 | Wt 183.7 lb

## 2024-06-08 DIAGNOSIS — Z3042 Encounter for surveillance of injectable contraceptive: Secondary | ICD-10-CM

## 2024-06-08 NOTE — Progress Notes (Signed)
 Date last pap: 08/16/2021. Last Depo-Provera : 03/16/24. Side Effects if any: N/a. Serum HCG indicated? N/a. Depo-Provera  150 mg IM given in L Del Next appointment due Feb 11-25. .. Administrations This Visit     medroxyPROGESTERone  (DEPO-PROVERA ) injection 150 mg     Admin Date 06/08/2024 Action Given Dose 150 mg Route Intramuscular Documented By Doneta Laymon BIRCH, RN

## 2024-06-27 ENCOUNTER — Ambulatory Visit: Admitting: Obstetrics

## 2024-07-13 ENCOUNTER — Other Ambulatory Visit (HOSPITAL_COMMUNITY)
Admission: RE | Admit: 2024-07-13 | Discharge: 2024-07-13 | Disposition: A | Discharge status: Home / Self Care | Source: Ambulatory Visit | Attending: Obstetrics and Gynecology | Admitting: Obstetrics and Gynecology

## 2024-07-13 ENCOUNTER — Encounter: Payer: Self-pay | Admitting: Obstetrics and Gynecology

## 2024-07-13 ENCOUNTER — Ambulatory Visit: Admitting: Obstetrics and Gynecology

## 2024-07-13 VITALS — BP 133/89 | Ht 67.2 in | Wt 180.4 lb

## 2024-07-13 DIAGNOSIS — Z1231 Encounter for screening mammogram for malignant neoplasm of breast: Secondary | ICD-10-CM

## 2024-07-13 DIAGNOSIS — N898 Other specified noninflammatory disorders of vagina: Secondary | ICD-10-CM | POA: Insufficient documentation

## 2024-07-13 DIAGNOSIS — Z01419 Encounter for gynecological examination (general) (routine) without abnormal findings: Secondary | ICD-10-CM

## 2024-07-13 DIAGNOSIS — N3000 Acute cystitis without hematuria: Secondary | ICD-10-CM

## 2024-07-13 DIAGNOSIS — Z113 Encounter for screening for infections with a predominantly sexual mode of transmission: Secondary | ICD-10-CM | POA: Diagnosis present

## 2024-07-13 DIAGNOSIS — Z3042 Encounter for surveillance of injectable contraceptive: Secondary | ICD-10-CM

## 2024-07-13 LAB — POCT URINALYSIS DIPSTICK
Bilirubin, UA: NEGATIVE
Glucose, UA: NEGATIVE
Ketones, UA: NEGATIVE
Nitrite, UA: POSITIVE
Protein, UA: NEGATIVE
Spec Grav, UA: 1.015
Urobilinogen, UA: 0.2 U/dL
pH, UA: 7.5

## 2024-07-13 MED ORDER — FLUCONAZOLE 150 MG PO TABS: 150.0000 mg | ORAL_TABLET | Freq: Once | ORAL | 0 refills | Status: AC

## 2024-07-13 MED ORDER — MEDROXYPROGESTERONE ACETATE 150 MG/ML IM SUSY: 150.0000 mg | PREFILLED_SYRINGE | INTRAMUSCULAR | 3 refills | Status: AC

## 2024-07-13 MED ORDER — NITROFURANTOIN MONOHYD MACRO 100 MG PO CAPS: 100.0000 mg | ORAL_CAPSULE | Freq: Two times a day (BID) | ORAL | 0 refills | Status: AC

## 2024-07-13 NOTE — Progress Notes (Signed)
 "  ANNUAL EXAM Patient name: Amy Davies MRN [id number]  Date of birth: 08-11-1985 Chief Complaint:   No chief complaint on file.  History of Present Illness:   Amy Davies is a 38 y.o. [phone]  female being seen today for a routine annual exam.  Current complaints: uses depo provera  for contraception and depo induced amenorrhea  Reports she might have a UTI, she is experiencing odor in urine  No LMP recorded (lmp unknown). Patient has had an injection.   The pregnancy intention screening data noted above was reviewed. Potential methods of contraception were discussed. The patient elected to proceed with depo  Gynecologic History Contraception: depo Last Pap:08/16/2021 . Results were: NILM neg HPV Last mammogram:n/a     07/13/2024    1:23 PM 12/03/2021    4:15 PM 08/22/2021   11:22 AM 08/15/2021    9:46 AM 12/04/2017   11:51 AM  Depression screen PHQ 2/9  Decreased Interest 0 0 0 0 1  Down, Depressed, Hopeless 0 0 0 0 1  PHQ - 2 Score 0 0 0 0 2  Altered sleeping 0 0 0 0 0  Tired, decreased energy 0 0 0 0 2  Change in appetite 0 0 0 0 3  Feeling bad or failure about yourself  0 0 0 0 1  Trouble concentrating 0 0 0 0 0  Moving slowly or fidgety/restless 0 0 0 0 0  Suicidal thoughts 0 0 0 0 0  PHQ-9 Score 0 0  0  0  8      Data saved with a previous flowsheet row definition        07/13/2024    1:24 PM 12/03/2021    4:16 PM 08/15/2021    9:47 AM 12/04/2017   11:52 AM  GAD 7 : Generalized Anxiety Score  Nervous, Anxious, on Edge 0 0 0 1  Control/stop worrying 0 0 0 1  Worry too much - different things 0 0 0 2  Trouble relaxing 0 0 0 1  Restless 0 0 0 0  Easily annoyed or irritable 0 0 0 2  Afraid - awful might happen 0 0 0 1  Total GAD 7 Score 0 0 0 8     Review of Systems:   Pertinent items are noted in HPI Denies any headaches, blurred vision, fatigue, shortness of breath, chest pain, abdominal pain, abnormal vaginal discharge/itching/odor/irritation, problems  with periods, bowel movements, urination, or intercourse unless otherwise stated above. Pertinent History Reviewed:  Reviewed past medical,surgical, social and family history.  Reviewed problem list, medications and allergies. Physical Assessment:   Vitals:   07/13/24 1314  BP: 133/89  Weight: 180 lb 6.4 oz (81.8 kg)  Height: 5' 7.2 (1.707 m)  Body mass index is 28.09 kg/m.        Physical Examination:   General appearance - well appearing, and in no distress  Mental status - alert, oriented   Psych:  She has a normal mood and affect  Skin - warm and dry, normal color  Chest - effort normal, all lung fields clear to auscultation bilaterally  Heart - normal rate and regular rhythm  Neck:  midline trachea, no thyromegaly or nodules  Breasts - breasts appear normal, no suspicious masses, no skin or nipple changes or  axillary nodes  Abdomen - soft, nontender, nondistended  Pelvic - deferred  Extremities:  No swelling or varicosities noted  Chaperone present for exam  No results found for this or  any previous visit (from the past 24 hours).  Assessment & Plan:  1. Encounter for annual routine gynecological examination (Primary) UTD on pap Discussed first mammogram Declines labs today   2. Surveillance for Depo-Provera  contraception Continue depo, next due in Feb -refill sent  3. Encounter for screening mammogram for malignant neoplasm of breast  - MM 3D SCREENING MAMMOGRAM BILATERAL BREAST; Future  4. Acute cystitis without hematuria Positive nitrates and leuks, tx sent and sent diflucan  for yeast after abx  - POCT Urinalysis Dipstick - Urine Culture  5. Screen for STD (sexually transmitted disease)  - Cervicovaginal ancillary only( [place])  6. Vaginal odor  - Cervicovaginal ancillary only( [place])   Labs/procedures today:   Mammogram: @ 40yo, or sooner if problems Colonoscopy: @ 40yo, or sooner if problems  Orders Placed This Encounter   Procedures   MM 3D SCREENING MAMMOGRAM BILATERAL BREAST    Meds: No orders of the defined types were placed in this encounter.   Follow-up: Return nure visit depo 2/11-2/25, then one year annual.   Nidia Daring, FNP   "

## 2024-07-13 NOTE — Progress Notes (Signed)
 Last PAP 08/16/21. Pt on depo. Pt has not had a period since 2017.  Pt has questions concerning urine.

## 2024-07-15 ENCOUNTER — Ambulatory Visit: Payer: Self-pay | Admitting: Obstetrics and Gynecology

## 2024-07-15 DIAGNOSIS — N3000 Acute cystitis without hematuria: Secondary | ICD-10-CM

## 2024-07-15 LAB — CERVICOVAGINAL ANCILLARY ONLY
Bacterial Vaginitis (gardnerella): NEGATIVE
Candida Glabrata: NEGATIVE
Candida Vaginitis: NEGATIVE
Chlamydia: NEGATIVE
Comment: NEGATIVE
Comment: NEGATIVE
Comment: NEGATIVE
Comment: NEGATIVE
Comment: NEGATIVE
Comment: NORMAL
Neisseria Gonorrhea: NEGATIVE
Trichomonas: NEGATIVE

## 2024-07-18 LAB — URINE CULTURE

## 2024-07-18 MED ORDER — CEPHALEXIN 500 MG PO CAPS: 500.0000 mg | ORAL_CAPSULE | Freq: Two times a day (BID) | ORAL | 0 refills | Status: AC

## 2024-07-20 ENCOUNTER — Ambulatory Visit

## 2024-08-25 ENCOUNTER — Ambulatory Visit: Payer: Self-pay
